# Patient Record
Sex: Female | Born: 1975 | Race: White | Hispanic: No | Marital: Single | Smoking: Never smoker
Health system: Southern US, Community
[De-identification: ages and names within clinical notes are randomized; demographics above are authoritative.]

## PROBLEM LIST (undated history)

## (undated) DIAGNOSIS — K900 Celiac disease: Secondary | ICD-10-CM

## (undated) DIAGNOSIS — M1990 Unspecified osteoarthritis, unspecified site: Secondary | ICD-10-CM

## (undated) DIAGNOSIS — I10 Essential (primary) hypertension: Secondary | ICD-10-CM

## (undated) DIAGNOSIS — Z87442 Personal history of urinary calculi: Secondary | ICD-10-CM

## (undated) DIAGNOSIS — K5900 Constipation, unspecified: Secondary | ICD-10-CM

## (undated) DIAGNOSIS — K829 Disease of gallbladder, unspecified: Secondary | ICD-10-CM

## (undated) DIAGNOSIS — K219 Gastro-esophageal reflux disease without esophagitis: Secondary | ICD-10-CM

## (undated) DIAGNOSIS — E119 Type 2 diabetes mellitus without complications: Secondary | ICD-10-CM

## (undated) DIAGNOSIS — G9332 Myalgic encephalomyelitis/chronic fatigue syndrome: Secondary | ICD-10-CM

## (undated) DIAGNOSIS — D649 Anemia, unspecified: Secondary | ICD-10-CM

## (undated) DIAGNOSIS — R0602 Shortness of breath: Secondary | ICD-10-CM

## (undated) DIAGNOSIS — N200 Calculus of kidney: Secondary | ICD-10-CM

## (undated) DIAGNOSIS — K869 Disease of pancreas, unspecified: Secondary | ICD-10-CM

## (undated) DIAGNOSIS — K859 Acute pancreatitis without necrosis or infection, unspecified: Secondary | ICD-10-CM

## (undated) DIAGNOSIS — R12 Heartburn: Secondary | ICD-10-CM

## (undated) DIAGNOSIS — M2550 Pain in unspecified joint: Secondary | ICD-10-CM

## (undated) DIAGNOSIS — E538 Deficiency of other specified B group vitamins: Secondary | ICD-10-CM

## (undated) DIAGNOSIS — F419 Anxiety disorder, unspecified: Secondary | ICD-10-CM

## (undated) DIAGNOSIS — E559 Vitamin D deficiency, unspecified: Secondary | ICD-10-CM

## (undated) DIAGNOSIS — M549 Dorsalgia, unspecified: Secondary | ICD-10-CM

## (undated) DIAGNOSIS — L409 Psoriasis, unspecified: Secondary | ICD-10-CM

## (undated) DIAGNOSIS — F32A Depression, unspecified: Secondary | ICD-10-CM

## (undated) HISTORY — DX: Disease of pancreas, unspecified: K86.9

## (undated) HISTORY — DX: Dorsalgia, unspecified: M54.9

## (undated) HISTORY — DX: Constipation, unspecified: K59.00

## (undated) HISTORY — PX: SPINE SURGERY: SHX786

## (undated) HISTORY — DX: Anemia, unspecified: D64.9

## (undated) HISTORY — PX: WISDOM TOOTH EXTRACTION: SHX21

## (undated) HISTORY — DX: Shortness of breath: R06.02

## (undated) HISTORY — DX: Deficiency of other specified B group vitamins: E53.8

## (undated) HISTORY — DX: Pain in unspecified joint: M25.50

## (undated) HISTORY — DX: Vitamin D deficiency, unspecified: E55.9

## (undated) HISTORY — DX: Heartburn: R12

## (undated) HISTORY — DX: Disease of gallbladder, unspecified: K82.9

## (undated) HISTORY — DX: Depression, unspecified: F32.A

## (undated) HISTORY — DX: Myalgic encephalomyelitis/chronic fatigue syndrome: G93.32

## (undated) HISTORY — DX: Anxiety disorder, unspecified: F41.9

## (undated) HISTORY — DX: Calculus of kidney: N20.0

## (undated) HISTORY — PX: CHOLECYSTECTOMY: SHX55

---

## 2003-09-27 ENCOUNTER — Ambulatory Visit (HOSPITAL_COMMUNITY): Admission: RE | Admit: 2003-09-27 | Discharge: 2003-09-27 | Payer: Self-pay | Admitting: Family Medicine

## 2003-10-02 ENCOUNTER — Ambulatory Visit (HOSPITAL_COMMUNITY): Admission: RE | Admit: 2003-10-02 | Discharge: 2003-10-02 | Payer: Self-pay | Admitting: Family Medicine

## 2003-10-11 ENCOUNTER — Ambulatory Visit (HOSPITAL_COMMUNITY): Admission: RE | Admit: 2003-10-11 | Discharge: 2003-10-11 | Payer: Self-pay | Admitting: General Surgery

## 2007-04-06 ENCOUNTER — Ambulatory Visit: Payer: Self-pay | Admitting: Internal Medicine

## 2007-04-18 ENCOUNTER — Ambulatory Visit (HOSPITAL_COMMUNITY): Admission: RE | Admit: 2007-04-18 | Discharge: 2007-04-18 | Payer: Self-pay | Admitting: Internal Medicine

## 2007-04-18 ENCOUNTER — Ambulatory Visit: Payer: Self-pay | Admitting: Internal Medicine

## 2007-06-01 ENCOUNTER — Ambulatory Visit: Payer: Self-pay | Admitting: Gastroenterology

## 2009-04-09 ENCOUNTER — Emergency Department (HOSPITAL_COMMUNITY): Admission: EM | Admit: 2009-04-09 | Discharge: 2009-04-09 | Payer: Self-pay | Admitting: Emergency Medicine

## 2010-01-09 ENCOUNTER — Encounter: Admission: RE | Admit: 2010-01-09 | Discharge: 2010-01-09 | Payer: Self-pay | Admitting: Orthopedic Surgery

## 2010-01-21 ENCOUNTER — Encounter (INDEPENDENT_AMBULATORY_CARE_PROVIDER_SITE_OTHER): Payer: Self-pay | Admitting: Orthopedic Surgery

## 2010-01-21 ENCOUNTER — Inpatient Hospital Stay (HOSPITAL_COMMUNITY): Admission: RE | Admit: 2010-01-21 | Discharge: 2010-01-22 | Payer: Self-pay | Admitting: Orthopedic Surgery

## 2010-07-02 LAB — TYPE AND SCREEN
ABO/RH(D): O POS
Antibody Screen: NEGATIVE

## 2010-07-02 LAB — CBC
HCT: 33.4 % — ABNORMAL LOW (ref 36.0–46.0)
HCT: 36.8 % (ref 36.0–46.0)
HCT: 40.3 % (ref 36.0–46.0)
Hemoglobin: 10.3 g/dL — ABNORMAL LOW (ref 12.0–15.0)
Hemoglobin: 11.6 g/dL — ABNORMAL LOW (ref 12.0–15.0)
Hemoglobin: 12.9 g/dL (ref 12.0–15.0)
MCH: 23.7 pg — ABNORMAL LOW (ref 26.0–34.0)
MCH: 23.9 pg — ABNORMAL LOW (ref 26.0–34.0)
MCH: 24.4 pg — ABNORMAL LOW (ref 26.0–34.0)
MCHC: 30.8 g/dL (ref 30.0–36.0)
MCHC: 31.5 g/dL (ref 30.0–36.0)
MCHC: 32 g/dL (ref 30.0–36.0)
MCV: 75.9 fL — ABNORMAL LOW (ref 78.0–100.0)
MCV: 76.3 fL — ABNORMAL LOW (ref 78.0–100.0)
MCV: 77 fL — ABNORMAL LOW (ref 78.0–100.0)
Platelets: 236 10*3/uL (ref 150–400)
Platelets: 259 10*3/uL (ref 150–400)
Platelets: 301 10*3/uL (ref 150–400)
RBC: 4.34 MIL/uL (ref 3.87–5.11)
RBC: 4.85 MIL/uL (ref 3.87–5.11)
RBC: 5.28 MIL/uL — ABNORMAL HIGH (ref 3.87–5.11)
RDW: 16.7 % — ABNORMAL HIGH (ref 11.5–15.5)
RDW: 16.9 % — ABNORMAL HIGH (ref 11.5–15.5)
RDW: 17.1 % — ABNORMAL HIGH (ref 11.5–15.5)
WBC: 8.7 10*3/uL (ref 4.0–10.5)
WBC: 8.9 10*3/uL (ref 4.0–10.5)
WBC: 8.9 10*3/uL (ref 4.0–10.5)

## 2010-07-02 LAB — URINALYSIS, ROUTINE W REFLEX MICROSCOPIC
Bilirubin Urine: NEGATIVE
Glucose, UA: NEGATIVE mg/dL
Ketones, ur: NEGATIVE mg/dL
Nitrite: NEGATIVE
Protein, ur: NEGATIVE mg/dL
Specific Gravity, Urine: 1.028 (ref 1.005–1.030)
Urobilinogen, UA: 0.2 mg/dL (ref 0.0–1.0)
pH: 5 (ref 5.0–8.0)

## 2010-07-02 LAB — ABO/RH: ABO/RH(D): O POS

## 2010-07-02 LAB — COMPREHENSIVE METABOLIC PANEL
ALT: 20 U/L (ref 0–35)
AST: 21 U/L (ref 0–37)
Albumin: 3.5 g/dL (ref 3.5–5.2)
Alkaline Phosphatase: 78 U/L (ref 39–117)
BUN: 12 mg/dL (ref 6–23)
CO2: 24 mEq/L (ref 19–32)
Calcium: 9.3 mg/dL (ref 8.4–10.5)
Chloride: 108 mEq/L (ref 96–112)
Creatinine, Ser: 0.83 mg/dL (ref 0.4–1.2)
GFR calc Af Amer: >60 mL/min (ref >60)
GFR calc non Af Amer: >60 mL/min (ref >60)
Glucose, Bld: 82 mg/dL (ref 70–99)
Potassium: 3.9 mEq/L (ref 3.5–5.1)
Sodium: 140 mEq/L (ref 135–145)
Total Bilirubin: 0.6 mg/dL (ref 0.3–1.2)
Total Protein: 7 g/dL (ref 6.0–8.3)

## 2010-07-02 LAB — DIFFERENTIAL
Basophils Absolute: 0 10*3/uL (ref 0.0–0.1)
Basophils Absolute: 0 10*3/uL (ref 0.0–0.1)
Basophils Relative: 0 % (ref 0–1)
Basophils Relative: 0 % (ref 0–1)
Eosinophils Absolute: 0.3 10*3/uL (ref 0.0–0.7)
Eosinophils Absolute: 0.4 10*3/uL (ref 0.0–0.7)
Eosinophils Relative: 4 % (ref 0–5)
Eosinophils Relative: 4 % (ref 0–5)
Lymphocytes Relative: 20 % (ref 12–46)
Lymphocytes Relative: 22 % (ref 12–46)
Lymphs Abs: 1.7 10*3/uL (ref 0.7–4.0)
Lymphs Abs: 1.9 10*3/uL (ref 0.7–4.0)
Monocytes Absolute: 0.6 10*3/uL (ref 0.1–1.0)
Monocytes Absolute: 0.7 10*3/uL (ref 0.1–1.0)
Monocytes Relative: 7 % (ref 3–12)
Monocytes Relative: 8 % (ref 3–12)
Neutro Abs: 6 10*3/uL (ref 1.7–7.7)
Neutro Abs: 6 10*3/uL (ref 1.7–7.7)
Neutrophils Relative %: 67 % (ref 43–77)
Neutrophils Relative %: 68 % (ref 43–77)

## 2010-07-02 LAB — PROTIME-INR
INR: 1.12 (ref 0.00–1.49)
Prothrombin Time: 14.6 seconds (ref 11.6–15.2)

## 2010-07-02 LAB — URINE MICROSCOPIC-ADD ON

## 2010-07-02 LAB — BASIC METABOLIC PANEL
BUN: 10 mg/dL (ref 6–23)
CO2: 25 mEq/L (ref 19–32)
Calcium: 8.4 mg/dL (ref 8.4–10.5)
Chloride: 108 mEq/L (ref 96–112)
Creatinine, Ser: 0.86 mg/dL (ref 0.4–1.2)
GFR calc Af Amer: >60 mL/min (ref >60)
GFR calc non Af Amer: >60 mL/min (ref >60)
Glucose, Bld: 120 mg/dL — ABNORMAL HIGH (ref 70–99)
Potassium: 3.6 mEq/L (ref 3.5–5.1)
Sodium: 139 mEq/L (ref 135–145)

## 2010-07-02 LAB — SURGICAL PCR SCREEN
MRSA, PCR: NEGATIVE
Staphylococcus aureus: POSITIVE — AB

## 2010-07-02 LAB — APTT: aPTT: 35 seconds (ref 24–37)

## 2010-07-20 LAB — RAPID STREP SCREEN (MED CTR MEBANE ONLY): Streptococcus, Group A Screen (Direct): NEGATIVE

## 2010-09-01 NOTE — Assessment & Plan Note (Signed)
NAMEARCHER, VISE                 CHART#:  [id number]   DATE:  06/01/2007                       DOB:  [birth year]   CHIEF COMPLAINT:  Follow up EGD.   SUBJECTIVE:  April Gonzales is a 35 year old female.  She was hospitalized  with idiopathic pancreatitis.  She has had some nausea and odynophagia,  which improved recently.  She was scheduled EGD on 04/18/07.  She was  found to have tiny distal esophageal erosions consistent with mild  erosive reflux esophagitis and a small hiatal hernia.  She has been  placed on Prilosec 20 mg daily.  She is doing quite well.  She denies  any problems with dysphagia or odynophagia.  She denies any abdominal  pain.  Denies any anorexia.  She denies any heartburn or indigestion.  She is having 2-3 soft, brown nonbloody bowel movements per day.  After  hospitalization, she was found to have anemia.  She did have a low iron  and percent saturation; however, a normal ferritin, UIBC, TIBC, and  folate.  She was to return three Hemoccult cards, but she did not do  this.  LFTs, amylase, and lipase were normal back in December.  Overall,  she feels very well.  Her weight has remained stable.  She tells me her  lipid status was okay when she was hospitalized at [facility].   CURRENT MEDICATIONS:  Omeprazole 20 mg daily.   ALLERGIES:  No known drug allergies.   OBJECTIVE:  VITAL SIGNS:  Weight 340 pounds.  Height 67 inches.  Temp  97, blood pressure 150/84, pulse 60.  GENERAL:  She is a morbid obesity Caucasian female who is alert,  oriented, pleasant, and cooperative in no acute distress.  HEENT:  Sclerae are clear and nonicteric.  Conjunctivae are pink.  Oropharynx pink and moist without any lesions.  CHEST:  Heart regular rate and rhythm with normal S1 and S2 without  murmurs, clicks, rubs, or gallops.  ABDOMEN:  Positive bowel sounds x4.  No bruits auscultated.  Soft,  nontender, nondistended without palpable mass or hepatosplenomegaly.   No  rebound tenderness or guarding.  Exam is limited secondary to patient  body habitus.  EXTREMITIES:  Without clubbing or edema bilaterally.   ASSESSMENT:  April Gonzales is a 35 year old female with erosive reflux  esophagitis.  Has responded well to PPI.  She also has a history of  idiopathic pancreatitis in November, 2008, hospitalized at [facility], brought on after Z-pack.  She has a mild anemia.   PLAN:  1. Would recheck H&H.  2. Suggested significant weight loss.  3. Continue omeprazole 20 mg daily.  4. Will discuss further with Dr. Jena Gauss whether she is going to need      EUS.   Addendum:  Discussed with Dr Jena Gauss, will f/u 6 months.  No need for EUS  now.       April Gonzales, N.P.  Electronically Signed     April Gonzales, M.D.  Electronically Signed    KJ/MEDQ  D:  06/02/2007  T:  06/05/2007  Job:  [id number]   cc:   April Gonzales, M.D.

## 2010-09-01 NOTE — Op Note (Signed)
April Gonzales, April Gonzales                ACCOUNT NO.:  [id number]   MEDICAL RECORD NO.:  [id number]          PATIENT TYPE:  AMB   LOCATION:  DAY                           FACILITY:  [facility]   PHYSICIAN:  R. Roetta Sessions, M.D. DATE OF BIRTH:  January 05, 1976   DATE OF PROCEDURE:  04/18/2007  DATE OF DISCHARGE:                               OPERATIVE REPORT   DIAGNOSTIC ESOPHAGOGASTRODUODENOSCOPY:   INDICATIONS FOR PROCEDURE:  This is a 35 year old morbidly obese  Caucasian female recently hospitalized with pancreatitis.  She has  recovered from that illness.  She has had some nausea and odynophagia,  which has improved recently.  She is not on any acid suppression  therapy.  EGD is now being done to further evaluate those symptoms.  This approach has discussed with the patient at length.  Potential  risks, benefits and alternatives have been reviewed, questions answered.  She is agreeable.  Please see the documentation in the medical record.   PROCEDURE NOTE:  O2 saturation, blood pressure, pulse and respirations  were monitored throughout the entire procedure.  Conscious sedation:  Versed 5 mg IV, Demerol 100 mg IV in divided doses, Cetacaine spray for  topical pharyngeal anesthesia.  Instrument:  Pentax video chip system.   FINDINGS:  Examination of the tubular esophagus revealed a couple of  tiny distal esophageal erosions.  Otherwise the esophageal mucosa  appeared entirely normal.  It was widely patent and easily traversed.   Stomach:  The gastric cavity was empty and insufflated well with air.  A  thorough examination of the gastric mucosa including retroflexed view of  the proximal stomach and esophagogastric junction demonstrated only a  small hiatal hernia.  The pylorus was patent and easily traversed.  Examination of the bulb and second portion revealed no abnormalities.   Therapeutic/diagnostic maneuvers performed:  None.   The patient tolerated the procedure well and was reacted  in endoscopy.   IMPRESSION:  1,  Tiny distal esophageal erosions consistent with mild  erosive reflux esophagitis, otherwise normal esophagus.  1. Small hiatal hernia, otherwise normal stomach, D1, D2.   RECOMMENDATIONS:  1. Gastroesophageal reflux literature provided to Ms. Raborn.  Begin      Prilosec 20 mg orally daily.  2. Will arrange a follow-up appointment with [place] in 6 weeks to see how      she is doing.      Jonathon Bellows, M.D.  Electronically Signed     RMR/MEDQ  D:  04/18/2007  T:  04/18/2007  Job:  [id number]   cc:   Patrica Duel, M.D.  Fax: [phone]

## 2010-09-01 NOTE — Consult Note (Signed)
NAMELICET, DUNPHY                ACCOUNT NO.:  [id number]   MEDICAL RECORD NO.:  [id number]          PATIENT TYPE:  AMB   LOCATION:  DAY                           FACILITY:  [facility]   PHYSICIAN:  R. Roetta Sessions, M.D. DATE OF BIRTH:  12/02/1974   DATE OF CONSULTATION:  04/06/2007  DATE OF DISCHARGE:                                 CONSULTATION   REASON FOR CONSULTATION:  Acute pancreatitis.   HISTORY OF PRESENT ILLNESS:  April Gonzales is a 35 year old female who was  admitted to [facility] on March 18, 2007.  She was  discharged March 23, 2007.  She was found to have acute pancreatitis.  She tells me that she had bronchitis.  She was treated with a Z-Pak.  She had had a large meal including salisbury steak and macaroni and  cheese and took her dose of medications.  She began to have burning  along her esophagus and in her epigastrium.  This persisted.  She  presented to the emergency room because of severe abdominal pain. While  there, she was found to have an elevated amylase of 492 and lipase of  941.  White count was 12,200.  She had a CT scan, which I do not have a  copy of, but is said to have moderate inflammatory changes along the  distal body until the pancreas without pseudocyst.  She is status post  cholecystectomy approximately 2 years ago secondary to a biliary  dyskinesia.  She had another episode of abdominal pain 1 week ago after  drinking a coke.  She denies any alcohol use.  She denies any history of  hypercholesterolemia and denies any anorexia, heartburn or indigestion.  Her weight has remained stable.  Although she did lose several pounds  when she was hospitalized, she has gained this back.  She denies  abdominal pain currently.  She does have some transient nausea.  Has not  vomited since she was in the hospital.  She did have fever with acute  episode of pain.  She did notice darkening of her urine, but denies any  clay-colored stools.  Denies  any jaundice.   PAST MEDICAL AND SURGICAL HISTORY:  1. She is status post cholecystectomy a couple of years ago due to      biliary dyskinesia.  2. She has severe psoriasis diagnosed at age 15.  3. She has had some wisdom teeth extracted.  4. She has history of recent bronchitis.   CURRENT MEDICATIONS:  Percocet p.r.n.   ALLERGIES:  No known drug allergies.   FAMILY HISTORY:  There is no known family history of carcinoma, liver or  chronic GI problems.  Mother has history of hypertension and diabetes  mellitus.  Father has history of hypertension.  She has one healthy  sister.   SOCIAL HISTORY:  April Gonzales lives with her parents.  She is single.  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  She denies any  tobacco, alcohol or drug use.   REVIEW OF SYSTEMS:  See HPI;  otherwise negative.   PHYSICAL EXAMINATION:  VITAL SIGNS:  Weight 338 pounds, height 68  inches, temperature 98.2, blood pressure 112/78, pulse 64, BMI 51.4.  GENERAL:  April Gonzales is a morbidly obese, Caucasian female who is alert  and pleasant, cooperative, in no acute distress.  HEENT.  Conjunctivae  clear.  Sclerae nonicteric. Oropharynx pink and moist without lesions.  NECK:  Supple without thyromegaly.  HEART:  Regular rate and rhythm.  Normal S1 and S2.  No murmurs, clicks,  rubs or gallops.  LUNGS:  Clear to auscultation bilaterally.  ABDOMEN:  Protuberant with positive bowel sounds x4.  No bruits  auscultated.  She does have erythema and demarcated scaling to her left  abdomen consistent with history of psoriasis.  Abdomen is otherwise  soft, nontender without palpable mass or hepatosplenomegaly.  No rebound  tenderness or guarding.  Exam is limited given the patient's body  habitus.  EXTREMITIES:  With trace lower extremity edema bilaterally.  She has  demarcated erythematous, scaling areas of both upper and lower  extremities as well as on her trunk and abdomen as described above.    IMPRESSION:  April Gonzales is a 35 year old, morbidly obese female who  developed acute pancreatitis approximately 3 weeks ago.  She had another  episode of pain 1 week ago.  She is status post cholecystectomy and  there is no common bile duct dilatation on CT report, though will need  to review films to be sure there is no evidence of gallstones or  actually choledocholithiasis.  There is no history of alcohol use.  Interesting was this all started around the time she was taking the Z-  Pak.  There is very little in the literature regarding azithromycin-  induced pancreatitis.  She is having some transient nausea and  odynophagia.  Would be concerned about peptic ulcer disease as the  culprit for her symptoms which is going to require further evaluation.  Also she was noted to have microcytic anemia while hospitalized which  may need further evaluation as well.  She has significant psoriasis.  Would be concerned of an autoimmune component and sphincter of Oddi  dysfunction should remain in the differential as well.   PLAN:  1. Check CBC, LFTs, amylase and lipase.  2. An EGD with Dr. Jena Gauss in the future.  I have discussed the      procedure including risks and benefits, including not limited to      infection, perforation, drug reaction.  She is agrees to the plan      and consent was obtained.  3. She has Percocet at home for pain p.r.n.  4. She is to go immediately to emergency room on and call us if she      has any further problems in the interim.  5. She is to stay on a bland low fat, low residue diet.  6. If CBC shows anemia, would proceed with iron studies and she may      possibly need a colonoscopy down the      road as well.  If EGD is benign, would consider further evaluation      with EUS versus MRCP to further evaluate the pancreas.   I would like to thank Dr. Nobie Putnam for allowing [place] to participate in the  care of April Gonzales.      Lorenza Burton, N.P.      Jonathon Bellows, M.D.  Electronically Signed    KJ/MEDQ  D:  04/06/2007  T:  04/07/2007  Job:  [id number]   cc:   Patrica Duel, M.D.  Fax: [phone]

## 2010-09-04 NOTE — Op Note (Signed)
NAME:  April Gonzales, April Gonzales                          ACCOUNT NO.:  [id number]   MEDICAL RECORD NO.:  [id number]                   PATIENT TYPE:  AMB   LOCATION:  DAY                                  FACILITY:  [facility]   PHYSICIAN:  Dalia Heading, M.D.               DATE OF BIRTH:  1976/01/01   DATE OF PROCEDURE:  10/11/2003  DATE OF DISCHARGE:                                 OPERATIVE REPORT   PREOPERATIVE DIAGNOSIS:  Chronic cholecystitis.   POSTOPERATIVE DIAGNOSIS:  Chronic cholecystitis.   PROCEDURE:  Laparoscopic cholecystectomy.   SURGEON:  Dalia Heading, M.D.   ASSISTANT:  Bernerd Limbo. Leona Carry, M.D.   ANESTHESIA:  General endotracheal.   INDICATIONS FOR PROCEDURE:  The patient is a 35 year old white female who  was referred for evaluation and treatment of biliary colic secondary to  chronic cholecystitis.  The risks and benefits of the procedure, including  bleeding, infection, hepatobiliary injury, and the possibility of an open  procedure were fully explained to the patient who gave informed consent.   DESCRIPTION OF PROCEDURE:  The patient was placed in the supine position.  After induction of general endotracheal anesthesia, the abdomen was prepped  and draped using the usual sterile technique with Betadine.  Surgical site  confirmation was performed.   A supraumbilical incision was made down to the fascia.  A Veress needle was  introduced into the abdominal cavity, and confirmation of placement was done  using the saline drop test.  The abdomen was then insufflated to 16 mmHg  pressure.  An 11-mm trocar was introduced into the abdominal cavity under  direct visualization without difficulty.  The patient was placed in reverse  Trendelenburg position, and an additional 11-mm trocar was placed in the  epigastric region and 5-mm trocars were placed in the right upper quadrant  and right flank regions.  The liver was inspected and noted to be within  normal limits.  The  gallbladder was retracted superiorly and laterally.  The  dissection was begun around the infundibulum of the gallbladder.  The cystic  duct was first identified.  Its juncture to the infundibulum was fully  identified.  Endoclips were placed proximally and distally on the cystic  duct, and the cystic duct was divided.  This was likewise done on the cystic  artery.  The gallbladder was then freed away from the gallbladder fossa  using Bovie electrocautery.  The gallbladder was delivered through the  epigastric trocar site using an EndoCatch bag.  The gallbladder fossa was  inspected, and no abnormal bleeding or bile leakage was noted.  Surgicel was  placed in the gallbladder fossa.  All fluid and air were then evacuated from  the abdominal cavity prior to removal of the trocars.   All wounds were irrigated with normal saline.  All wounds were injected with  0.5% Sensorcaine.  The fascia was inspected and noted to be  intact.  All  skin incisions were closed using staples.  Betadine ointment and dry sterile  dressings were applied.   All tape and needle counts were correct at the end of the procedure.  The  patient was extubated in the operating room and went back to the recovery  room awake and in stable condition.   COMPLICATIONS:  None.   SPECIMENS:  Gallbladder.   ESTIMATED BLOOD LOSS:  Minimal.      ___________________________________________                                            Dalia Heading, M.D.   MAJ/MEDQ  D:  10/11/2003  T:  10/11/2003  Job:  [id number]   cc:   Patrica Duel, M.D.  [address], [address]  [place]  [place] [address]  Fax: [phone]

## 2010-09-04 NOTE — H&P (Signed)
NAME:  April Gonzales, April Gonzales                          ACCOUNT NO.:  [id number]   MEDICAL RECORD NO.:  [id number]                  PATIENT TYPE:   LOCATION:                                       FACILITY:  [facility]   PHYSICIAN:  Dalia Heading, M.D.               DATE OF BIRTH:  [birth year]   DATE OF ADMISSION:  DATE OF DISCHARGE:                                HISTORY & PHYSICAL   CHIEF COMPLAINT:  Chronic cholecystitis.   HISTORY OF PRESENT ILLNESS:  Patient is a 35 year old white female who is  referred for evaluation and treatment of biliary colic secondary to chronic  cholecystitis.  She has been having intermittent episodes of right upper  quadrant abdominal pain with radiation to the right flank, nausea, bloating  for many weeks.  Patient does have a fatty-food intolerance.  No fever,  chills, or jaundice have been noted.  Her symptoms have been worsening.   PAST MEDICAL HISTORY:  Includes irritable bowel.   PAST SURGICAL HISTORY:  Wisdom teeth removal.   CURRENT MEDICATIONS:  Raptiva once a week.  Imodium p.r.n.  Flagyl as  needed.   ALLERGIES:  No known drug allergies.   REVIEW OF SYSTEMS:  Noncontributory.   PHYSICAL EXAMINATION:  VITAL SIGNS:  She is afebrile.  Vital signs are  stable.  GENERAL:  Patient is an obese white female in no acute distress.  HEENT:  No scleral icterus.  LUNGS:  Clear to auscultation with equal breath sounds bilaterally.  HEART:  Regular rate and rhythm without S3, S4, or murmurs.  ABDOMEN:  Soft with slight tenderness in the right upper quadrant to  palpation.  No hepatosplenomegaly, masses, or hernias are identified.   Ultrasound of the gallbladder is negative.  Hepatobiliary scan reveals  chronic cholecystitis with low gallbladder ejection fraction and  reproducible symptoms with a fatty meal.   IMPRESSION:  Chronic cholecystitis.   PLAN:  Patient is scheduled for a laparoscopic cholecystectomy on October 11, 2003.  The risks and benefits of  the procedure, including bleeding,  infection, hepatobiliary injury, and the possibility of an open procedure,  were fully explained to the patient, who gave informed consent.     ___________________________________________                                         Dalia Heading, M.D.   MAJ/MEDQ  D:  10/08/2003  T:  10/08/2003  Job:  [id number]   cc:   Patrica Duel, M.D.  [address]  [place]  [place] [address]  Fax: [phone]

## 2011-01-22 LAB — HCG, QUANTITATIVE, PREGNANCY: hCG, Beta Chain, Quant, S: <2

## 2012-11-20 ENCOUNTER — Telehealth: Payer: Self-pay | Admitting: Certified Nurse Midwife

## 2012-11-20 NOTE — Telephone Encounter (Signed)
Phone call to patient regarding need to discuss follow up

## 2012-11-20 NOTE — Telephone Encounter (Signed)
Call to patient to call back regarding her decision for follow up, Left message on answering machine

## 2012-11-30 ENCOUNTER — Encounter: Payer: Self-pay | Admitting: Certified Nurse Midwife

## 2012-12-07 ENCOUNTER — Encounter: Payer: Self-pay | Admitting: Certified Nurse Midwife

## 2015-03-11 ENCOUNTER — Other Ambulatory Visit: Payer: Self-pay | Admitting: Nurse Practitioner

## 2015-03-11 DIAGNOSIS — R928 Other abnormal and inconclusive findings on diagnostic imaging of breast: Secondary | ICD-10-CM

## 2015-03-19 ENCOUNTER — Ambulatory Visit
Admission: RE | Admit: 2015-03-19 | Discharge: 2015-03-19 | Disposition: A | Discharge status: Home / Self Care | Payer: 59 | Source: Ambulatory Visit | Attending: Nurse Practitioner | Admitting: Nurse Practitioner

## 2015-03-19 ENCOUNTER — Other Ambulatory Visit: Payer: Self-pay | Admitting: Nurse Practitioner

## 2015-03-19 DIAGNOSIS — Z1231 Encounter for screening mammogram for malignant neoplasm of breast: Secondary | ICD-10-CM

## 2015-03-19 DIAGNOSIS — R928 Other abnormal and inconclusive findings on diagnostic imaging of breast: Secondary | ICD-10-CM

## 2015-03-21 ENCOUNTER — Other Ambulatory Visit: Payer: Self-pay | Admitting: Nurse Practitioner

## 2015-03-21 DIAGNOSIS — R928 Other abnormal and inconclusive findings on diagnostic imaging of breast: Secondary | ICD-10-CM

## 2015-04-01 ENCOUNTER — Ambulatory Visit
Admission: RE | Admit: 2015-04-01 | Discharge: 2015-04-01 | Disposition: A | Discharge status: Home / Self Care | Payer: 59 | Source: Ambulatory Visit | Attending: Nurse Practitioner | Admitting: Nurse Practitioner

## 2015-04-01 DIAGNOSIS — R928 Other abnormal and inconclusive findings on diagnostic imaging of breast: Secondary | ICD-10-CM

## 2016-02-20 ENCOUNTER — Observation Stay (HOSPITAL_COMMUNITY)
Admission: EM | Admit: 2016-02-20 | Discharge: 2016-02-22 | Disposition: A | Discharge status: Home / Self Care | Payer: 59 | Attending: Internal Medicine | Admitting: Internal Medicine

## 2016-02-20 ENCOUNTER — Encounter (HOSPITAL_COMMUNITY): Payer: Self-pay | Admitting: *Deleted

## 2016-02-20 ENCOUNTER — Emergency Department (HOSPITAL_COMMUNITY): Payer: 59

## 2016-02-20 DIAGNOSIS — Z8349 Family history of other endocrine, nutritional and metabolic diseases: Secondary | ICD-10-CM

## 2016-02-20 DIAGNOSIS — K859 Acute pancreatitis without necrosis or infection, unspecified: Secondary | ICD-10-CM | POA: Diagnosis not present

## 2016-02-20 DIAGNOSIS — K5900 Constipation, unspecified: Secondary | ICD-10-CM | POA: Insufficient documentation

## 2016-02-20 DIAGNOSIS — Z8249 Family history of ischemic heart disease and other diseases of the circulatory system: Secondary | ICD-10-CM | POA: Diagnosis not present

## 2016-02-20 DIAGNOSIS — L409 Psoriasis, unspecified: Secondary | ICD-10-CM | POA: Insufficient documentation

## 2016-02-20 DIAGNOSIS — Z833 Family history of diabetes mellitus: Secondary | ICD-10-CM

## 2016-02-20 DIAGNOSIS — Z9049 Acquired absence of other specified parts of digestive tract: Secondary | ICD-10-CM | POA: Diagnosis not present

## 2016-02-20 DIAGNOSIS — R030 Elevated blood-pressure reading, without diagnosis of hypertension: Secondary | ICD-10-CM | POA: Diagnosis not present

## 2016-02-20 DIAGNOSIS — K900 Celiac disease: Secondary | ICD-10-CM | POA: Diagnosis present

## 2016-02-20 DIAGNOSIS — Z79899 Other long term (current) drug therapy: Secondary | ICD-10-CM

## 2016-02-20 DIAGNOSIS — Z841 Family history of disorders of kidney and ureter: Secondary | ICD-10-CM

## 2016-02-20 DIAGNOSIS — K219 Gastro-esophageal reflux disease without esophagitis: Secondary | ICD-10-CM | POA: Insufficient documentation

## 2016-02-20 DIAGNOSIS — Z885 Allergy status to narcotic agent status: Secondary | ICD-10-CM

## 2016-02-20 DIAGNOSIS — Z6841 Body Mass Index (BMI) 40.0 and over, adult: Secondary | ICD-10-CM | POA: Diagnosis not present

## 2016-02-20 HISTORY — DX: Celiac disease: K90.0

## 2016-02-20 HISTORY — DX: Psoriasis, unspecified: L40.9

## 2016-02-20 LAB — CBC
HEMATOCRIT: 42.1 % (ref 36.0–46.0)
HEMOGLOBIN: 13.4 g/dL (ref 12.0–15.0)
MCH: 25.5 pg — ABNORMAL LOW (ref 26.0–34.0)
MCHC: 31.8 g/dL (ref 30.0–36.0)
MCV: 80 fL (ref 78.0–100.0)
Platelets: 333 10*3/uL (ref 150–400)
RBC: 5.26 MIL/uL — ABNORMAL HIGH (ref 3.87–5.11)
RDW: 16.5 % — ABNORMAL HIGH (ref 11.5–15.5)
WBC: 12.8 10*3/uL — ABNORMAL HIGH (ref 4.0–10.5)

## 2016-02-20 LAB — COMPREHENSIVE METABOLIC PANEL
ALBUMIN: 3.5 g/dL (ref 3.5–5.0)
ALT: 21 U/L (ref 14–54)
ANION GAP: 9 (ref 5–15)
AST: 20 U/L (ref 15–41)
Alkaline Phosphatase: 81 U/L (ref 38–126)
BILIRUBIN TOTAL: 0.6 mg/dL (ref 0.3–1.2)
BUN: 12 mg/dL (ref 6–20)
CHLORIDE: 102 mmol/L (ref 101–111)
CO2: 27 mmol/L (ref 22–32)
Calcium: 9 mg/dL (ref 8.9–10.3)
Creatinine, Ser: 0.96 mg/dL (ref 0.44–1.00)
GFR calc Af Amer: >60 mL/min (ref >60)
GFR calc non Af Amer: >60 mL/min (ref >60)
GLUCOSE: 101 mg/dL — AB (ref 65–99)
POTASSIUM: 4.3 mmol/L (ref 3.5–5.1)
SODIUM: 138 mmol/L (ref 135–145)
TOTAL PROTEIN: 7.8 g/dL (ref 6.5–8.1)

## 2016-02-20 LAB — URINALYSIS, ROUTINE W REFLEX MICROSCOPIC
Bilirubin Urine: NEGATIVE
Glucose, UA: NEGATIVE mg/dL
Hgb urine dipstick: NEGATIVE
Ketones, ur: NEGATIVE mg/dL
NITRITE: NEGATIVE
PH: 5 (ref 5.0–8.0)
Protein, ur: NEGATIVE mg/dL
SPECIFIC GRAVITY, URINE: 1.022 (ref 1.005–1.030)

## 2016-02-20 LAB — URINE MICROSCOPIC-ADD ON: RBC / HPF: NONE SEEN RBC/hpf (ref 0–5)

## 2016-02-20 LAB — LIPASE, BLOOD: LIPASE: 33 U/L (ref 11–51)

## 2016-02-20 LAB — POC URINE PREG, ED: Preg Test, Ur: NEGATIVE

## 2016-02-20 MED ORDER — HEPARIN SODIUM (PORCINE) 5000 UNIT/ML IJ SOLN
5000.0000 [IU] | Freq: Three times a day (TID) | INTRAMUSCULAR | Status: DC
  Administered 2016-02-20 – 2016-02-22 (×5): 5000 [IU] via SUBCUTANEOUS
  Filled 2016-02-20 (×5): qty 1

## 2016-02-20 MED ORDER — MORPHINE SULFATE (PF) 4 MG/ML IV SOLN
4.0000 mg | INTRAVENOUS | Status: DC | PRN
  Administered 2016-02-20 – 2016-02-21 (×3): 4 mg via INTRAVENOUS
  Filled 2016-02-20 (×4): qty 1

## 2016-02-20 MED ORDER — DOCUSATE SODIUM 100 MG PO CAPS
100.0000 mg | ORAL_CAPSULE | Freq: Two times a day (BID) | ORAL | Status: DC
  Administered 2016-02-20 – 2016-02-21 (×2): 100 mg via ORAL
  Filled 2016-02-20 (×4): qty 1

## 2016-02-20 MED ORDER — SODIUM CHLORIDE 0.9 % IV SOLN
INTRAVENOUS | Status: AC
  Administered 2016-02-20 – 2016-02-21 (×2): via INTRAVENOUS

## 2016-02-20 MED ORDER — MORPHINE SULFATE (PF) 2 MG/ML IV SOLN: 1.0000 mg | INTRAVENOUS | Status: DC | PRN

## 2016-02-20 MED ORDER — IOPAMIDOL (ISOVUE-300) INJECTION 61%
INTRAVENOUS | Status: AC
  Administered 2016-02-20: 100 mL
  Filled 2016-02-20: qty 100

## 2016-02-20 MED ORDER — ONDANSETRON HCL 4 MG/2ML IJ SOLN
4.0000 mg | Freq: Three times a day (TID) | INTRAMUSCULAR | Status: AC | PRN
  Administered 2016-02-21: 4 mg via INTRAVENOUS
  Filled 2016-02-20: qty 2

## 2016-02-20 MED ORDER — SENNOSIDES-DOCUSATE SODIUM 8.6-50 MG PO TABS: 1.0000 | ORAL_TABLET | Freq: Every evening | ORAL | Status: DC | PRN

## 2016-02-20 MED ORDER — SODIUM CHLORIDE 0.9 % IV BOLUS (SEPSIS)
1000.0000 mL | Freq: Once | INTRAVENOUS | Status: AC
  Administered 2016-02-20: 1000 mL via INTRAVENOUS

## 2016-02-20 MED ORDER — ONDANSETRON HCL 4 MG/2ML IJ SOLN
4.0000 mg | Freq: Once | INTRAMUSCULAR | Status: AC
Start: 2016-02-20 — End: 2016-02-20
  Administered 2016-02-20: 4 mg via INTRAVENOUS
  Filled 2016-02-20: qty 2

## 2016-02-20 MED ORDER — MORPHINE SULFATE (PF) 4 MG/ML IV SOLN
4.0000 mg | Freq: Once | INTRAVENOUS | Status: AC
  Administered 2016-02-20: 4 mg via INTRAVENOUS
  Filled 2016-02-20: qty 1

## 2016-02-20 NOTE — ED Provider Notes (Signed)
[facility]-EMERGENCY DEPT Provider Note   CSN: [id number] Arrival date & time: 02/20/16  1525     History   Chief Complaint Chief Complaint  Patient presents with  . Abdominal Pain    HPI April RicksJulie A Gonzales is a 40 y.o. female.  HPI   Patient with hx pancreatitis presents with upper abdominal pain with nausea and constipation x 3 days.  Pain is crampy and achy.   Pain has been gradually worsening, exacerbated with eating and lying flat.  Feels like prior pancreatitis.  At baseline has 2-3 bowel movements daily, has had very small hard to pass bowel movements for the past few days, last was this morning.  Unable to eat and drink due to pain. Denies fevers, vomiting, urinary symptoms, CP, cough, SOB.  Has past abdominal surgery of cholecystectomy only.     Pt is on Stelara - immunosuppressive therapy - for her psoriasis.    History reviewed. No pertinent past medical history.  Patient Active Problem List   Diagnosis Date Noted  . Acute pancreatitis 02/20/2016    History reviewed. No pertinent surgical history.  OB History    No data available       Home Medications    Prior to Admission medications   Not on File    Family History No family history on file.  Social History Social History  Substance Use Topics  . Smoking status: Never Smoker  . Smokeless tobacco: Never Used  . Alcohol use No     Allergies   Dilaudid [hydromorphone hcl]   Review of Systems Review of Systems  All other systems reviewed and are negative.    Physical Exam Updated Vital Signs BP 161/84   Pulse 74   Temp 98.7 F (37.1 C) (Oral)   Resp 16   Ht 5\' 6"  (1.676 m)   Wt (!) 169.3 kg   LMP 02/06/2016   SpO2 99%   BMI 60.23 kg/m   Physical Exam  Constitutional: She appears well-developed and well-nourished. No distress.  Morbidly obese  HENT:  Head: Normocephalic and atraumatic.  Neck: Neck supple.  Cardiovascular: Normal rate and regular rhythm.   Pulmonary/Chest: Effort  normal and breath sounds normal. No respiratory distress. She has no wheezes. She has no rales.  Abdominal: Soft. She exhibits no distension. There is tenderness (epigastric, RUQ). There is no rebound and no guarding.  Neurological: She is alert.  Skin: She is not diaphoretic.  Nursing note and vitals reviewed.    ED Treatments / Results  Labs (all labs ordered are listed, but only abnormal results are displayed) Labs Reviewed  COMPREHENSIVE METABOLIC PANEL - Abnormal; Notable for the following:       Result Value   Glucose, Bld 101 (*)    All other components within normal limits  CBC - Abnormal; Notable for the following:    WBC 12.8 (*)    RBC 5.26 (*)    MCH 25.5 (*)    RDW 16.5 (*)    All other components within normal limits  URINALYSIS, ROUTINE W REFLEX MICROSCOPIC (NOT [facility]) - Abnormal; Notable for the following:    APPearance HAZY (*)    Leukocytes, UA SMALL (*)    All other components within normal limits  URINE MICROSCOPIC-ADD ON - Abnormal; Notable for the following:    Squamous Epithelial / LPF 0-5 (*)    Bacteria, UA FEW (*)    All other components within normal limits  LIPASE, BLOOD  POC URINE PREG, ED  EKG  EKG Interpretation None       Radiology Ct Abdomen Pelvis W Contrast  Result Date: 02/20/2016 CLINICAL DATA:  Epigastric pain radiating to the right flank for several days, initial encounter EXAM: CT ABDOMEN AND PELVIS WITH CONTRAST TECHNIQUE: Multidetector CT imaging of the abdomen and pelvis was performed using the standard protocol following bolus administration of intravenous contrast. CONTRAST:  100 mL ISOVUE-300 IOPAMIDOL (ISOVUE-300) INJECTION 61% COMPARISON:  None. FINDINGS: Lower chest: No acute abnormality. Hepatobiliary: No focal liver abnormality is seen. Status post cholecystectomy. No biliary dilatation. Pancreas: Some heterogeneity is noted in the region of the head of the pancreas with some peripancreatic inflammatory changes.  Scattered peripancreatic lymph nodes are noted. 11 mm node in the portacaval space is noted. Spleen: Normal in size without focal abnormality. Adrenals/Urinary Tract: Adrenal glands are unremarkable. Kidneys are normal, without renal calculi, focal lesion, or hydronephrosis. Bladder is unremarkable. Stomach/Bowel: Stomach is within normal limits. Appendix appears normal. No evidence of bowel wall thickening, distention, or inflammatory changes. Vascular/Lymphatic: No significant vascular findings are present. No enlarged abdominal or pelvic lymph nodes. Reproductive: Uterus and bilateral adnexa are unremarkable. Other: No abdominal wall hernia or abnormality. No abdominopelvic ascites. Musculoskeletal: No acute or significant osseous findings. IMPRESSION: Mild peripancreatic inflammatory change with heterogeneity in the region of the pancreatic head. Changes are consistent with focal acute pancreatitis with adjacent lymphadenopathy. Electronically Signed   By: Alcide CleverMark  Lukens M.D.   On: 02/20/2016 19:19    Procedures Procedures (including critical care time)  Medications Ordered in ED Medications  morphine 4 MG/ML injection 4 mg (4 mg Intravenous Given 02/20/16 1806)  sodium chloride 0.9 % bolus 1,000 mL (1,000 mLs Intravenous New Bag/Given 02/20/16 1806)  ondansetron (ZOFRAN) injection 4 mg (4 mg Intravenous Given 02/20/16 1806)  iopamidol (ISOVUE-300) 61 % injection (  Canceled Entry 02/20/16 1916)     Initial Impression / Assessment and Plan / ED Course  I have reviewed the triage vital signs and the nursing notes.  Pertinent labs & imaging results that were available during my care of the patient were reviewed by me and considered in my medical decision making (see chart for details).  Clinical Course    Afebrile patient with upper abdominal pain, constipation, nausea.  Pt is morbidly obese, exam somewhat limited secondary to body habitus.  Labs unremarkable.  UA does not appear infected.  Upreg  negative.  Pt is on therapy for her psoriasis that makes her immunocompromised. CT abd/pelvis demonstrates acute pancreatitis.  [facility] to admit, Dr Debe CoderEmily Mullen accepting   Final Clinical Impressions(s) / ED Diagnoses   Final diagnoses:  Acute pancreatitis without infection or necrosis, unspecified pancreatitis type    New Prescriptions New Prescriptions   No medications on file     Trixie Dredgemily Tashianna Broome, PA-C 02/20/16 2006    Benjiman CoreNathan Pickering, MD 02/20/16 2317

## 2016-02-20 NOTE — ED Triage Notes (Signed)
The pt is c/o epigastric pain with rt side radiation for 2 days she saw her doctor today and he sent her here she has had pancreatitis in the past  Unknown cause

## 2016-02-20 NOTE — H&P (Signed)
History and Physical    April RicksJulie A Gonzales [id number] DOB: [birth year] DOA: 02/20/2016  PCP: Filomena JunglingEDWARDS, JERRY, NP  Patient coming from: Home  Chief Complaint: epigastric pain radiating to back  HPI: April RicksJulie A April Gonzales is a 40 y.o. female with medical history significant of borderline DM (no meds), borderline HTN (no meds), celiac disease, psoriasis who presents for epigastric pain.  She notes that the pain started Wednesday and was moderate. She thought it was gas pain and took some gas medication for it and some ibuprofen which did not help.  The pain worsened to the point that she could not lie down and so she came to be seen.  The pain was 10/10 at it's worst.  She reports nothing made the pain any better until the morphine she got in the ED.  She had some nausea, but no emesis. She ate yesterday, but when she tried to eat soup today her pain got much worse. She has had subjective fevers, but no chills.  Further symptoms includes constipation.  She has had no blood loss, change in bowel habits, chest pain, SOB, urinary symptoms.  She has had her gallbladder out, but she cannot remember why as it was a long time ago.  She does not drink ETOH.  She does not know if she has been checked for cholesterol issues, but her father has high cholesterol.     ED Course: In the Ed, she was given fluids and pain medications which helped her pain.  She had a normal lipase, so CT abdomen was done which showed focal acute pancreatitis.    Review of Systems: As per HPI otherwise 10 point review of systems negative.    Past Medical History:  Diagnosis Date  . Celiac disease   . Psoriasis     Past Surgical History:  Procedure Laterality Date  . CHOLECYSTECTOMY       reports that she has never smoked. She has never used smokeless tobacco. She reports that she does not drink alcohol or use drugs.  Allergies  Allergen Reactions  . Dilaudid [Hydromorphone Hcl]     Family History  Problem Relation Age of Onset   . Diabetes Mellitus II Mother   . Hypertension Mother   . CAD Father   . Chronic Renal Failure Father   . Hyperlipidemia Father    Medications Stelara for her psoriasis   Physical Exam: Vitals:   02/20/16 1930 02/20/16 1945 02/20/16 2032 02/20/16 2047  BP: 156/84 161/84  (!) 158/62  Pulse: 73 74  81  Resp:    17  Temp:   98.3 F (36.8 C) 99 F (37.2 C)  TempSrc:   Oral Oral  SpO2: 98% 99%  99%  Weight:    (!) 376 lb 8 oz (170.8 kg)  Height:    5' 6.5" (1.689 m)      Constitutional: NAD, calm, comfortable, obese Vitals:   02/20/16 1930 02/20/16 1945 02/20/16 2032 02/20/16 2047  BP: 156/84 161/84  (!) 158/62  Pulse: 73 74  81  Resp:    17  Temp:   98.3 F (36.8 C) 99 F (37.2 C)  TempSrc:   Oral Oral  SpO2: 98% 99%  99%  Weight:    (!) 376 lb 8 oz (170.8 kg)  Height:    5' 6.5" (1.689 m)   Eyes: PERRLA, EOMI, no conjunctival pallor ENMT: MMM, moon face Respiratory: CTAB, no wheezing, rales Cardiovascular: RR, NR, no murmur noted Abdomen: Obese, +BS, TTP over epigastrium  and RUQ.  Otherwise no distention or pain Musculoskeletal: No clubbing or cyanosis.  Normal muscle tone in LE Skin: some psoriatic plaques, small on feet and legs.  No acute rash Psychiatric: Pleasant, normal mood  Labs on Admission: I have personally reviewed following labs and imaging studies  CBC:  Recent Labs Lab 02/20/16 1537  WBC 12.8*  HGB 13.4  HCT 42.1  MCV 80.0  PLT 333   Basic Metabolic Panel:  Recent Labs Lab 02/20/16 1537  NA 138  K 4.3  CL 102  CO2 27  GLUCOSE 101*  BUN 12  CREATININE 0.96  CALCIUM 9.0   GFR: Estimated Creatinine Clearance: 127.3 mL/min (by C-G formula based on SCr of 0.96 mg/dL). Liver Function Tests:  Recent Labs Lab 02/20/16 1537  AST 20  ALT 21  ALKPHOS 81  BILITOT 0.6  PROT 7.8  ALBUMIN 3.5    Recent Labs Lab 02/20/16 1537  LIPASE 33   Urine analysis:    Component Value Date/Time   COLORURINE YELLOW 02/20/2016 1740     APPEARANCEUR HAZY (A) 02/20/2016 1740   LABSPEC 1.022 02/20/2016 1740   PHURINE 5.0 02/20/2016 1740   GLUCOSEU NEGATIVE 02/20/2016 1740   HGBUR NEGATIVE 02/20/2016 1740   BILIRUBINUR NEGATIVE 02/20/2016 1740   KETONESUR NEGATIVE 02/20/2016 1740   PROTEINUR NEGATIVE 02/20/2016 1740   UROBILINOGEN 0.2 01/20/2010 1241   NITRITE NEGATIVE 02/20/2016 1740   LEUKOCYTESUR SMALL (A) 02/20/2016 1740   Radiological Exams on Admission: Ct Abdomen Pelvis W Contrast  Result Date: 02/20/2016 CLINICAL DATA:  Epigastric pain radiating to the right flank for several days, initial encounter EXAM: CT ABDOMEN AND PELVIS WITH CONTRAST TECHNIQUE: Multidetector CT imaging of the abdomen and pelvis was performed using the standard protocol following bolus administration of intravenous contrast. CONTRAST:  100 mL ISOVUE-300 IOPAMIDOL (ISOVUE-300) INJECTION 61% COMPARISON:  None. FINDINGS: Lower chest: No acute abnormality. Hepatobiliary: No focal liver abnormality is seen. Status post cholecystectomy. No biliary dilatation. Pancreas: Some heterogeneity is noted in the region of the head of the pancreas with some peripancreatic inflammatory changes. Scattered peripancreatic lymph nodes are noted. 11 mm node in the portacaval space is noted. Spleen: Normal in size without focal abnormality. Adrenals/Urinary Tract: Adrenal glands are unremarkable. Kidneys are normal, without renal calculi, focal lesion, or hydronephrosis. Bladder is unremarkable. Stomach/Bowel: Stomach is within normal limits. Appendix appears normal. No evidence of bowel wall thickening, distention, or inflammatory changes. Vascular/Lymphatic: No significant vascular findings are present. No enlarged abdominal or pelvic lymph nodes. Reproductive: Uterus and bilateral adnexa are unremarkable. Other: No abdominal wall hernia or abnormality. No abdominopelvic ascites. Musculoskeletal: No acute or significant osseous findings. IMPRESSION: Mild peripancreatic  inflammatory change with heterogeneity in the region of the pancreatic head. Changes are consistent with focal acute pancreatitis with adjacent lymphadenopathy. Electronically Signed   By: Alcide Clever M.D.   On: 02/20/2016 19:19     Assessment/Plan Acute pancreatitis - Lipase normal, but pancreas with inflammation on CT scan - Treat as pancreatitis at this time, monitor for acute changes - Repeat LFTs in the AM - Mild leukocytosis, trend - She does not have a GB, she is not an ETOH user.  Check lipid panel for triglycerides - IVF with NS at 125cc/hr overnight  - NPO - Ice chips okay - Consider clears in the AM if she is improved - Morphine PRN for pain, she is allergic to dilaudid (nausea)  Psoriasis - Takes Stelara, and recently had an injection.  - I am curious  if she has ever been on high dose steroids as she has a body habitus that could fit with a cushingoid type such as those on long term steroids.  She denies current steroid use.     Celiac disease - She tries to avoid gluten but is not strict about it - Currently constipated - Colace - PRN senna   DVT prophylaxis: Heparin SQ Code Status: Full Family Communication: Sister Misty StanleyLisa at bedside Disposition Plan: Admit, monitor for improvement, discharge in 1-2 days Admission status: Observation, Dwana Melenamed-surg   Ido Wollman MD [facility] Pager [phone]- [phone]  If 7PM-7AM, please contact night-coverage [email or web address] Password [email or web address]  02/20/2016, 9:37 PM

## 2016-02-21 DIAGNOSIS — K8500 Idiopathic acute pancreatitis without necrosis or infection: Secondary | ICD-10-CM

## 2016-02-21 DIAGNOSIS — K219 Gastro-esophageal reflux disease without esophagitis: Secondary | ICD-10-CM

## 2016-02-21 DIAGNOSIS — L409 Psoriasis, unspecified: Secondary | ICD-10-CM | POA: Diagnosis not present

## 2016-02-21 DIAGNOSIS — K859 Acute pancreatitis without necrosis or infection, unspecified: Secondary | ICD-10-CM | POA: Diagnosis not present

## 2016-02-21 DIAGNOSIS — K900 Celiac disease: Secondary | ICD-10-CM

## 2016-02-21 LAB — COMPREHENSIVE METABOLIC PANEL
ALT: 20 U/L (ref 14–54)
ANION GAP: 7 (ref 5–15)
AST: 17 U/L (ref 15–41)
Albumin: 3.1 g/dL — ABNORMAL LOW (ref 3.5–5.0)
Alkaline Phosphatase: 82 U/L (ref 38–126)
BUN: 11 mg/dL (ref 6–20)
CHLORIDE: 104 mmol/L (ref 101–111)
CO2: 27 mmol/L (ref 22–32)
Calcium: 8.4 mg/dL — ABNORMAL LOW (ref 8.9–10.3)
Creatinine, Ser: 0.95 mg/dL (ref 0.44–1.00)
Glucose, Bld: 139 mg/dL — ABNORMAL HIGH (ref 65–99)
POTASSIUM: 3.7 mmol/L (ref 3.5–5.1)
SODIUM: 138 mmol/L (ref 135–145)
Total Bilirubin: 0.5 mg/dL (ref 0.3–1.2)
Total Protein: 7.1 g/dL (ref 6.5–8.1)

## 2016-02-21 LAB — CBC
HCT: 38.6 % (ref 36.0–46.0)
HEMOGLOBIN: 12 g/dL (ref 12.0–15.0)
MCH: 24.8 pg — AB (ref 26.0–34.0)
MCHC: 31.1 g/dL (ref 30.0–36.0)
MCV: 79.9 fL (ref 78.0–100.0)
Platelets: 299 10*3/uL (ref 150–400)
RBC: 4.83 MIL/uL (ref 3.87–5.11)
RDW: 16.4 % — ABNORMAL HIGH (ref 11.5–15.5)
WBC: 13.5 10*3/uL — AB (ref 4.0–10.5)

## 2016-02-21 LAB — LIPID PANEL
CHOL/HDL RATIO: 2.8 ratio
Cholesterol: 103 mg/dL (ref 0–200)
HDL: 37 mg/dL — AB (ref >40)
LDL Cholesterol: 51 mg/dL (ref 0–99)
Triglycerides: 76 mg/dL (ref <150)
VLDL: 15 mg/dL (ref 0–40)

## 2016-02-21 MED ORDER — HYDRALAZINE HCL 20 MG/ML IJ SOLN: 10.0000 mg | Freq: Three times a day (TID) | INTRAMUSCULAR | Status: DC | PRN

## 2016-02-21 MED ORDER — INFLUENZA VAC SPLIT QUAD 0.5 ML IM SUSY: 0.5000 mL | PREFILLED_SYRINGE | INTRAMUSCULAR | Status: DC

## 2016-02-21 MED ORDER — POLYETHYLENE GLYCOL 3350 17 G PO PACK
17.0000 g | PACK | Freq: Every day | ORAL | Status: DC
  Filled 2016-02-21: qty 1

## 2016-02-21 MED ORDER — PANTOPRAZOLE SODIUM 40 MG PO TBEC
40.0000 mg | DELAYED_RELEASE_TABLET | Freq: Every day | ORAL | Status: DC
  Administered 2016-02-22: 40 mg via ORAL
  Filled 2016-02-21: qty 1

## 2016-02-21 NOTE — Progress Notes (Signed)
[facility] PROGRESS NOTE  April Gonzales [id number] DOB: 06-Feb-1976 DOA: 02/20/2016 PCP: Filomena JunglingEDWARDS, JERRY, NP  Interim summary and HPI 40 y.o. female with medical history significant of borderline DM (no meds), borderline HTN (no meds), celiac disease, psoriasis who presents for epigastric pain.  She notes that the pain started Wednesday and was moderate. She thought it was gas pain and took some gas medication for it and some ibuprofen which did not help.  The pain worsened to the point that she could not lie down and so she came to be seen.  The pain was 10/10 at it's worst.  She reports nothing made the pain any better until the morphine she got in the ED.  She had some nausea, but no emesis. She ate yesterday, but when she tried to eat soup today her pain got much worse. She has had subjective fevers, but no chills.  Further symptoms includes constipation.  She has had no blood loss, change in bowel habits, chest pain, SOB, urinary symptoms.  She has had her gallbladder out, but she cannot remember why as it was a long time ago.  She does not drink ETOH.  She does not know if she has been checked for cholesterol issues, but her father has high cholesterol.     Assessment/Plan: 1-acute epigastric pain: due to acute pancreatitis -idiopathic -no abnormalities on gallbladder, normal TG, no hx of alcohol abuse -no medications that can lead to pancreatitis appreciated -will continue supportive care and PRN analgesics -will advance diet and start PPI (in case there is component of gastritis with obesity)  2-obesity: -Body mass index is 59.86 kg/m. -low calorie diet and exercise discussed with patient  3-elevated BP: w/o hx of HTN -will monitor and use PRN hydralazine while inpatient  -patient advise to follow heart healthy diet -if needed will start antihypertensive agents -difficult to assess properly in setting of pain  4-hx of psoriasis -continue stelara at discharge  5-Celiac  disease: with constipation -will use colace and miralax -advise to maintain good hydration and increase fiber intake. -patient is currently following gluten free diet (or at least that is what she said)  Code Status: Full Family Communication: no family at bedisde Disposition Plan: home when medically stable; will advance diet and most likely home in am if tolerated.   Consultants:  None   Procedures:  See below for x-ray reports   Antibiotics:  None   HPI/Subjective: Afebrile, no CP, no SOB, reports feeling better and will like to have diet advance. No nausea, no vomiting   Objective: Vitals:   02/21/16 0422 02/21/16 1322  BP: (!) 168/88 (!) 169/80  Pulse: 79 72  Resp: 16   Temp: 97.9 F (36.6 C) 99.1 F (37.3 C)    Intake/Output Summary (Last 24 hours) at 02/21/16 1718 Last data filed at 02/21/16 1500  Gross per 24 hour  Intake             3250 ml  Output              350 ml  Net             2900 ml   Filed Weights   02/20/16 1532 02/20/16 1534 02/20/16 2047  Weight: (!) 169.2 kg (373 lb) (!) 169.3 kg (373 lb 3 oz) (!) 170.8 kg (376 lb 8 oz)    Exam:   General:  Obese, in no distress. Reports improvement in his abd pain and deneis CP and SOB. No nausea  or vomiting. Will like to have diet advance.  Cardiovascular: S1 and S2, no rubs, no gallops  Respiratory: CTA bilaterally  Abdomen: mid epigastric tenderness on palpations, obese, soft abdomen, no guarding, no distension   Musculoskeletal: trace edema bilaterally  Data Reviewed: Basic Metabolic Panel:  Recent Labs Lab 02/20/16 1537 02/21/16 0024  NA 138 138  K 4.3 3.7  CL 102 104  CO2 27 27  GLUCOSE 101* 139*  BUN 12 11  CREATININE 0.96 0.95  CALCIUM 9.0 8.4*   Liver Function Tests:  Recent Labs Lab 02/20/16 1537 02/21/16 0024  AST 20 17  ALT 21 20  ALKPHOS 81 82  BILITOT 0.6 0.5  PROT 7.8 7.1  ALBUMIN 3.5 3.1*    Recent Labs Lab 02/20/16 1537  LIPASE 33   CBC:  Recent  Labs Lab 02/20/16 1537 02/21/16 0024  WBC 12.8* 13.5*  HGB 13.4 12.0  HCT 42.1 38.6  MCV 80.0 79.9  PLT 333 299   Studies: Ct Abdomen Pelvis W Contrast  Result Date: 02/20/2016 CLINICAL DATA:  Epigastric pain radiating to the right flank for several days, initial encounter EXAM: CT ABDOMEN AND PELVIS WITH CONTRAST TECHNIQUE: Multidetector CT imaging of the abdomen and pelvis was performed using the standard protocol following bolus administration of intravenous contrast. CONTRAST:  100 mL ISOVUE-300 IOPAMIDOL (ISOVUE-300) INJECTION 61% COMPARISON:  None. FINDINGS: Lower chest: No acute abnormality. Hepatobiliary: No focal liver abnormality is seen. Status post cholecystectomy. No biliary dilatation. Pancreas: Some heterogeneity is noted in the region of the head of the pancreas with some peripancreatic inflammatory changes. Scattered peripancreatic lymph nodes are noted. 11 mm node in the portacaval space is noted. Spleen: Normal in size without focal abnormality. Adrenals/Urinary Tract: Adrenal glands are unremarkable. Kidneys are normal, without renal calculi, focal lesion, or hydronephrosis. Bladder is unremarkable. Stomach/Bowel: Stomach is within normal limits. Appendix appears normal. No evidence of bowel wall thickening, distention, or inflammatory changes. Vascular/Lymphatic: No significant vascular findings are present. No enlarged abdominal or pelvic lymph nodes. Reproductive: Uterus and bilateral adnexa are unremarkable. Other: No abdominal wall hernia or abnormality. No abdominopelvic ascites. Musculoskeletal: No acute or significant osseous findings. IMPRESSION: Mild peripancreatic inflammatory change with heterogeneity in the region of the pancreatic head. Changes are consistent with focal acute pancreatitis with adjacent lymphadenopathy. Electronically Signed   By: Alcide CleverMark  Lukens M.D.   On: 02/20/2016 19:19    Scheduled Meds: . docusate sodium  100 mg Oral BID  . heparin  5,000 Units  Subcutaneous Q8H  . [START ON 02/22/2016] Influenza vac split quadrivalent PF  0.5 mL Intramuscular Tomorrow-1000  . polyethylene glycol  17 g Oral Daily   Continuous Infusions:   Active Problems:   Acute pancreatitis without infection or necrosis   Pancreatitis   Psoriasis   Celiac disease    Time spent: 25 minutes    Vassie LollMadera, Rohin Krejci  [facility] Pager [phone]. If 7PM-7AM, please contact night-coverage at [email or web address], password [email or web address] 02/21/2016, 5:18 PM  LOS: 0 days

## 2016-02-22 DIAGNOSIS — K859 Acute pancreatitis without necrosis or infection, unspecified: Secondary | ICD-10-CM

## 2016-02-22 DIAGNOSIS — K219 Gastro-esophageal reflux disease without esophagitis: Secondary | ICD-10-CM

## 2016-02-22 DIAGNOSIS — L409 Psoriasis, unspecified: Secondary | ICD-10-CM | POA: Diagnosis not present

## 2016-02-22 DIAGNOSIS — R030 Elevated blood-pressure reading, without diagnosis of hypertension: Secondary | ICD-10-CM

## 2016-02-22 DIAGNOSIS — K900 Celiac disease: Secondary | ICD-10-CM | POA: Diagnosis not present

## 2016-02-22 LAB — COMPREHENSIVE METABOLIC PANEL
ALBUMIN: 2.8 g/dL — AB (ref 3.5–5.0)
ALK PHOS: 63 U/L (ref 38–126)
ALT: 17 U/L (ref 14–54)
AST: 17 U/L (ref 15–41)
Anion gap: 6 (ref 5–15)
BILIRUBIN TOTAL: 0.7 mg/dL (ref 0.3–1.2)
BUN: 8 mg/dL (ref 6–20)
CALCIUM: 8.4 mg/dL — AB (ref 8.9–10.3)
CO2: 27 mmol/L (ref 22–32)
Chloride: 104 mmol/L (ref 101–111)
Creatinine, Ser: 0.85 mg/dL (ref 0.44–1.00)
GFR calc non Af Amer: >60 mL/min (ref >60)
Glucose, Bld: 113 mg/dL — ABNORMAL HIGH (ref 65–99)
POTASSIUM: 3.7 mmol/L (ref 3.5–5.1)
SODIUM: 137 mmol/L (ref 135–145)
TOTAL PROTEIN: 6.6 g/dL (ref 6.5–8.1)

## 2016-02-22 LAB — LIPASE, BLOOD: Lipase: 20 U/L (ref 11–51)

## 2016-02-22 LAB — HEMOGLOBIN A1C
HEMOGLOBIN A1C: 6.6 % — AB (ref 4.8–5.6)
Mean Plasma Glucose: 143 mg/dL

## 2016-02-22 MED ORDER — DOCUSATE SODIUM 100 MG PO CAPS: 100.0000 mg | ORAL_CAPSULE | Freq: Two times a day (BID) | ORAL | 0 refills | Status: DC

## 2016-02-22 MED ORDER — PANTOPRAZOLE SODIUM 40 MG PO TBEC: 40.0000 mg | DELAYED_RELEASE_TABLET | Freq: Every day | ORAL | 1 refills | Status: DC

## 2016-02-22 MED ORDER — POLYETHYLENE GLYCOL 3350 17 G PO PACK: 17.0000 g | PACK | Freq: Every day | ORAL | 0 refills | Status: DC

## 2016-02-22 NOTE — Progress Notes (Signed)
Patient discharged to home with instructions. 

## 2016-02-22 NOTE — Discharge Summary (Signed)
Physician Discharge Summary  Harvel RicksJulie A Kleinfelter [id number] DOB: [birth year] DOA: 02/20/2016  PCP: Filomena JunglingEDWARDS, JERRY, NP  Admit date: 02/20/2016 Discharge date: 02/22/2016  Time spent: 30 minutes  Recommendations for Outpatient Follow-up:  1. Reassess BP and initiate antihypertensive regimen if needed    Discharge Diagnoses:  Active Problems:   Acute pancreatitis without infection or necrosis   Pancreatitis   Psoriasis   Celiac disease   Morbid obesity (HCC)   Elevated BP without diagnosis of hypertension   Gastroesophageal reflux disease   Discharge Condition: stable and improved. Instructed to follow up with PCP in 10 days  Diet recommendation: low calorie and lowfat/heart healthy diet   Filed Weights   02/20/16 1532 02/20/16 1534 02/20/16 2047  Weight: (!) 169.2 kg (373 lb) (!) 169.3 kg (373 lb 3 oz) (!) 170.8 kg (376 lb 8 oz)    History of present illness:  40 y.o.femalewith medical history significant of borderline DM (no meds), borderline HTN (no meds), celiac disease, psoriasis who presents for epigastric pain. She notes that the pain started Wednesday and was moderate. She thought it was gas pain and took some gas medication for it and some ibuprofen which did not help. The pain worsened to the point that she could not lie down and so she came to be seen. The pain was 10/10 at it's worst. She reports nothing made the pain any better until the morphine she got in the ED. She had some nausea, but no emesis. She ate yesterday, but when she tried to eat soup today her pain got much worse. She has had subjective fevers, but no chills. Further symptoms includes constipation. She has had no blood loss, change in bowel habits, chest pain, SOB, urinary symptoms. She has had her gallbladder out, but she cannot remember why as it was a long time ago. She does not drink ETOH. She does not know if she has been checked for cholesterol issues, but her father has high cholesterol.    Hospital Course:  1-acute epigastric pain: due to acute pancreatitis -idiopathic -no abnormalities on gallbladder, normal TG, no hx of alcohol abuse -no medications that can lead to pancreatitis appreciated -pain improved/resolved -lipase and LFT's WNL at discharge -diet advance to low fat diet and well tolerated  2-obesity: -Body mass index is 59.86 kg/m. -low calorie diet and exercise discussed with patient  3-elevated BP: w/o hx of HTN -patient advise to follow heart healthy diet -will need follow up with PCP and if BP remains elevated will need initiation of antihypertensive regimen   4-hx of psoriasis -continue stelara at discharge  5-Celiac disease: with constipation at this point -will discharge on colace and miralax -advise to maintain good hydration and increase fiber intake. -patient is currently following gluten free diet (or at least that is what she said); which was encouraged  6-GERD: with concerns of gastritis due to use of NSAID's -will discharge on PPI -obesity can also contribute as risk factor for GERD  Procedures:  See below for x-ray reports   Consultations:  None  Discharge Exam: Vitals:   02/22/16 0606 02/22/16 1459  BP: (!) 162/90 (!) 149/76  Pulse: 70 77  Resp:    Temp: 98.2 F (36.8 C) 99.3 F (37.4 C)    General:  Obese, in no distress. Reports resolution of her abd pain, no nausea, no vomtiing and been able to tolerate regular diet.   Cardiovascular: S1 and S2, no rubs, no gallops  Respiratory: CTA bilaterally  Abdomen: no  tenderness, positive BS, no guarding    Musculoskeletal: trace edema bilaterally   Discharge Instructions   Discharge Instructions    Diet - low sodium heart healthy    Complete by:  As directed    Discharge instructions    Complete by:  As directed    Take medications as prescribed Keep yourself well hydrated Please follow up with PCP in 10 days Follow low calorie and heart healthy diet      Current Discharge Medication List    START taking these medications   Details  docusate sodium (COLACE) 100 MG capsule Take 1 capsule (100 mg total) by mouth 2 (two) times daily. Qty: 60 capsule, Refills: 0    pantoprazole (PROTONIX) 40 MG tablet Take 1 tablet (40 mg total) by mouth daily at 12 noon. Qty: 30 tablet, Refills: 1    polyethylene glycol (MIRALAX / GLYCOLAX) packet Take 17 g by mouth daily. Qty: 28 each, Refills: 0      CONTINUE these medications which have NOT CHANGED   Details  ibuprofen (ADVIL,MOTRIN) 200 MG tablet Take 200 mg by mouth every 6 (six) hours as needed for mild pain.    simethicone (MYLICON) 80 MG chewable tablet Chew 80 mg by mouth every 6 (six) hours as needed for flatulence.    STELARA 90 MG/ML SOSY injection Inject 90 mg into the muscle every 3 (three) months.       Allergies  Allergen Reactions  . Dilaudid [Hydromorphone Hcl] Nausea And Vomiting   Follow-up Information    EDWARDS, JERRY, NP. Schedule an appointment as soon as possible for a visit in 10 day(s).   Specialty:  Nurse Practitioner Contact information: [address] [place] [address] [phone]           The results of significant diagnostics from this hospitalization (including imaging, microbiology, ancillary and laboratory) are listed below for reference.    Significant Diagnostic Studies: Ct Abdomen Pelvis W Contrast  Result Date: 02/20/2016 CLINICAL DATA:  Epigastric pain radiating to the right flank for several days, initial encounter EXAM: CT ABDOMEN AND PELVIS WITH CONTRAST TECHNIQUE: Multidetector CT imaging of the abdomen and pelvis was performed using the standard protocol following bolus administration of intravenous contrast. CONTRAST:  100 mL ISOVUE-300 IOPAMIDOL (ISOVUE-300) INJECTION 61% COMPARISON:  None. FINDINGS: Lower chest: No acute abnormality. Hepatobiliary: No focal liver abnormality is seen. Status post cholecystectomy. No biliary  dilatation. Pancreas: Some heterogeneity is noted in the region of the head of the pancreas with some peripancreatic inflammatory changes. Scattered peripancreatic lymph nodes are noted. 11 mm node in the portacaval space is noted. Spleen: Normal in size without focal abnormality. Adrenals/Urinary Tract: Adrenal glands are unremarkable. Kidneys are normal, without renal calculi, focal lesion, or hydronephrosis. Bladder is unremarkable. Stomach/Bowel: Stomach is within normal limits. Appendix appears normal. No evidence of bowel wall thickening, distention, or inflammatory changes. Vascular/Lymphatic: No significant vascular findings are present. No enlarged abdominal or pelvic lymph nodes. Reproductive: Uterus and bilateral adnexa are unremarkable. Other: No abdominal wall hernia or abnormality. No abdominopelvic ascites. Musculoskeletal: No acute or significant osseous findings. IMPRESSION: Mild peripancreatic inflammatory change with heterogeneity in the region of the pancreatic head. Changes are consistent with focal acute pancreatitis with adjacent lymphadenopathy. Electronically Signed   By: Alcide Clever M.D.   On: 02/20/2016 19:19    Labs: Basic Metabolic Panel:  Recent Labs Lab 02/20/16 1537 02/21/16 0024 02/22/16 0515  NA 138 138 137  K 4.3 3.7 3.7  CL 102 104  104  CO2 27 27 27   GLUCOSE 101* 139* 113*  BUN 12 11 8   CREATININE 0.96 0.95 0.85  CALCIUM 9.0 8.4* 8.4*   Liver Function Tests:  Recent Labs Lab 02/20/16 1537 02/21/16 0024 02/22/16 0515  AST 20 17 17   ALT 21 20 17   ALKPHOS 81 82 63  BILITOT 0.6 0.5 0.7  PROT 7.8 7.1 6.6  ALBUMIN 3.5 3.1* 2.8*    Recent Labs Lab 02/20/16 1537 02/22/16 0515  LIPASE 33 20   CBC:  Recent Labs Lab 02/20/16 1537 02/21/16 0024  WBC 12.8* 13.5*  HGB 13.4 12.0  HCT 42.1 38.6  MCV 80.0 79.9  PLT 333 299    Signed:  Vassie LollMadera, Ramsay Bognar MD.  [facility] 02/22/2016, 4:27 PM

## 2016-08-12 ENCOUNTER — Other Ambulatory Visit: Payer: Self-pay | Admitting: Orthopaedic Surgery

## 2016-08-12 DIAGNOSIS — M4306 Spondylolysis, lumbar region: Secondary | ICD-10-CM

## 2016-09-02 ENCOUNTER — Other Ambulatory Visit: Payer: 59

## 2016-09-06 ENCOUNTER — Ambulatory Visit
Admission: RE | Admit: 2016-09-06 | Discharge: 2016-09-06 | Disposition: A | Discharge status: Home / Self Care | Payer: 59 | Source: Ambulatory Visit | Attending: Orthopaedic Surgery | Admitting: Orthopaedic Surgery

## 2016-09-06 DIAGNOSIS — M4306 Spondylolysis, lumbar region: Secondary | ICD-10-CM

## 2019-04-06 ENCOUNTER — Emergency Department (HOSPITAL_COMMUNITY): Payer: 59

## 2019-04-06 ENCOUNTER — Encounter (HOSPITAL_COMMUNITY): Payer: Self-pay | Admitting: Emergency Medicine

## 2019-04-06 ENCOUNTER — Other Ambulatory Visit: Payer: Self-pay

## 2019-04-06 ENCOUNTER — Emergency Department (HOSPITAL_COMMUNITY)
Admission: EM | Admit: 2019-04-06 | Discharge: 2019-04-06 | Discharge status: Against Medical Advice | Payer: 59 | Attending: Emergency Medicine | Admitting: Emergency Medicine

## 2019-04-06 ENCOUNTER — Emergency Department (HOSPITAL_COMMUNITY)
Admission: EM | Admit: 2019-04-06 | Discharge: 2019-04-07 | Disposition: A | Discharge status: Home / Self Care | Payer: 59 | Source: Home / Self Care | Attending: Emergency Medicine | Admitting: Emergency Medicine

## 2019-04-06 ENCOUNTER — Encounter (HOSPITAL_COMMUNITY): Payer: Self-pay

## 2019-04-06 DIAGNOSIS — M545 Low back pain, unspecified: Secondary | ICD-10-CM

## 2019-04-06 DIAGNOSIS — Y929 Unspecified place or not applicable: Secondary | ICD-10-CM | POA: Diagnosis not present

## 2019-04-06 DIAGNOSIS — W1830XA Fall on same level, unspecified, initial encounter: Secondary | ICD-10-CM | POA: Diagnosis not present

## 2019-04-06 DIAGNOSIS — R21 Rash and other nonspecific skin eruption: Secondary | ICD-10-CM | POA: Insufficient documentation

## 2019-04-06 DIAGNOSIS — M5417 Radiculopathy, lumbosacral region: Secondary | ICD-10-CM

## 2019-04-06 DIAGNOSIS — Y9301 Activity, walking, marching and hiking: Secondary | ICD-10-CM | POA: Insufficient documentation

## 2019-04-06 DIAGNOSIS — Y999 Unspecified external cause status: Secondary | ICD-10-CM | POA: Insufficient documentation

## 2019-04-06 DIAGNOSIS — R200 Anesthesia of skin: Secondary | ICD-10-CM | POA: Insufficient documentation

## 2019-04-06 LAB — CBC WITH DIFFERENTIAL/PLATELET
Abs Immature Granulocytes: 0.12 10*3/uL — ABNORMAL HIGH (ref 0.00–0.07)
Basophils Absolute: 0 10*3/uL (ref 0.0–0.1)
Basophils Relative: 0 %
Eosinophils Absolute: 0 10*3/uL (ref 0.0–0.5)
Eosinophils Relative: 0 %
HCT: 43.2 % (ref 36.0–46.0)
Hemoglobin: 13.5 g/dL (ref 12.0–15.0)
Immature Granulocytes: 1 %
Lymphocytes Relative: 7 %
Lymphs Abs: 1.1 10*3/uL (ref 0.7–4.0)
MCH: 25.7 pg — ABNORMAL LOW (ref 26.0–34.0)
MCHC: 31.3 g/dL (ref 30.0–36.0)
MCV: 82.1 fL (ref 80.0–100.0)
Monocytes Absolute: 0.5 10*3/uL (ref 0.1–1.0)
Monocytes Relative: 3 %
Neutro Abs: 13.7 10*3/uL — ABNORMAL HIGH (ref 1.7–7.7)
Neutrophils Relative %: 89 %
Platelets: 343 10*3/uL (ref 150–400)
RBC: 5.26 MIL/uL — ABNORMAL HIGH (ref 3.87–5.11)
RDW: 15.8 % — ABNORMAL HIGH (ref 11.5–15.5)
WBC: 15.5 10*3/uL — ABNORMAL HIGH (ref 4.0–10.5)
nRBC: 0 % (ref 0.0–0.2)

## 2019-04-06 LAB — BASIC METABOLIC PANEL
Anion gap: 10 (ref 5–15)
BUN: 18 mg/dL (ref 6–20)
CO2: 24 mmol/L (ref 22–32)
Calcium: 9.1 mg/dL (ref 8.9–10.3)
Chloride: 103 mmol/L (ref 98–111)
Creatinine, Ser: 0.9 mg/dL (ref 0.44–1.00)
GFR calc Af Amer: >60 mL/min (ref >60)
GFR calc non Af Amer: >60 mL/min (ref >60)
Glucose, Bld: 181 mg/dL — ABNORMAL HIGH (ref 70–99)
Potassium: 4.6 mmol/L (ref 3.5–5.1)
Sodium: 137 mmol/L (ref 135–145)

## 2019-04-06 MED ORDER — ENOXAPARIN SODIUM 300 MG/3ML IJ SOLN: 170.0000 mg | INTRAMUSCULAR | Status: DC

## 2019-04-06 MED ORDER — ENOXAPARIN SODIUM 100 MG/ML ~~LOC~~ SOLN
100.0000 mg | SUBCUTANEOUS | Status: AC
  Administered 2019-04-06: 100 mg via SUBCUTANEOUS
  Filled 2019-04-06: qty 1

## 2019-04-06 MED ORDER — LORAZEPAM 1 MG PO TABS
1.0000 mg | ORAL_TABLET | Freq: Once | ORAL | Status: AC
  Administered 2019-04-06: 13:00:00 1 mg via ORAL
  Filled 2019-04-06: qty 1

## 2019-04-06 MED ORDER — ENOXAPARIN SODIUM 300 MG/3ML IJ SOLN: 1.0000 mg/kg | Freq: Once | INTRAMUSCULAR | Status: DC

## 2019-04-06 MED ORDER — ENOXAPARIN SODIUM 80 MG/0.8ML ~~LOC~~ SOLN
70.0000 mg | SUBCUTANEOUS | Status: AC
  Administered 2019-04-06: 23:00:00 70 mg via SUBCUTANEOUS
  Filled 2019-04-06: qty 0.7

## 2019-04-06 NOTE — ED Notes (Signed)
ED Provider at bedside. 

## 2019-04-06 NOTE — ED Provider Notes (Signed)
[facility]-EMERGENCY DEPT Provider Note   CSN: [id number] Arrival date & time: 04/06/19  2129     History Chief Complaint  Patient presents with  . Leg Pain    thinks she has a DVT    April RicksJulie A Gonzales is a 43 y.o. female.  The history is provided by the patient and medical records. No language interpreter was used.  Leg Pain      43 year old morbidly obese female with prior history of spine surgery 10 years ago who presenting complaining of left leg pain with concern for potential DVT.  Patient was seen earlier this morning in the ER for pain to her lower back and recurrent falls.  She also endorsed numbness and weakness to her left lower extremity.  She has been using a walker to ambulate but felt unable to get up.  She did not complain of bowel bladder incontinence or bladder retention.  No complaints of fever night sweats cough or abdominal pain.  During her ER evaluation, an MRI of her low back demonstrate new disc fusion at the level of L3-4 and left L4 nerve root impingement.  Patient was recommended to continue prednisone, meloxicam and a neurosurgeon, Dr. Noel Geroldohen office will contact her for follow-up in the next several days.  Patient states was discharged home, she reported having another fall when she slid onto her buttocks.  She also complaining of swelling and heat coming from her left leg and states she is concerned for potential DVT.  She denies any prior history of DVT in the past.  Past Medical History:  Diagnosis Date  . Celiac disease   . Psoriasis     Patient Active Problem List   Diagnosis Date Noted  . Morbid obesity (HCC)   . Elevated BP without diagnosis of hypertension   . Gastroesophageal reflux disease   . Acute pancreatitis without infection or necrosis 02/20/2016  . Pancreatitis 02/20/2016  . Psoriasis   . Celiac disease     Past Surgical History:  Procedure Laterality Date  . CHOLECYSTECTOMY       OB History   No obstetric  history on file.     Family History  Problem Relation Age of Onset  . Diabetes Mellitus II Mother   . Hypertension Mother   . CAD Father   . Chronic Renal Failure Father   . Hyperlipidemia Father     Social History   Tobacco Use  . Smoking status: Never Smoker  . Smokeless tobacco: Never Used  Substance Use Topics  . Alcohol use: No  . Drug use: No    Home Medications Prior to Admission medications   Medication Sig Start Date End Date Taking? Authorizing Provider  ibuprofen (ADVIL,MOTRIN) 200 MG tablet Take 400 mg by mouth every 6 (six) hours as needed for mild pain or moderate pain.     [provider]  meloxicam (MOBIC) 15 MG tablet Take 15 mg by mouth daily. 04/05/19   [provider]  predniSONE (STERAPRED UNI-PAK 21 TAB) 5 MG (21) TBPK tablet Take 5-30 mg by mouth See admin instructions. Taper as directed 6-5-4-3-2-1 04/05/19   [provider]  STELARA 90 MG/ML SOSY injection Inject 90 mg into the muscle every 3 (three) months. 02/06/16   [provider]  triamcinolone cream (KENALOG) 0.1 % Apply 1 application topically 2 (two) times daily as needed (psoriasis).  01/26/18   [provider]    Allergies    Dilaudid [hydromorphone hcl]  Review  of Systems   Review of Systems  Musculoskeletal: Positive for arthralgias.  Neurological: Positive for numbness.  All other systems reviewed and are negative.   Physical Exam Updated Vital Signs BP (!) 167/84 (BP Location: Left Arm)   Pulse 90   Temp 98 F (36.7 C) (Oral)   Resp 19   Ht 5\' 8"  (1.727 m)   Wt (!) 170.1 kg   SpO2 97%   BMI 57.02 kg/m   Physical Exam Vitals and nursing note reviewed.  Constitutional:      General: She is not in acute distress.    Appearance: She is well-developed. She is obese.     Comments: Morbidly obese  HENT:     Head: Atraumatic.  Eyes:     Conjunctiva/sclera: Conjunctivae normal.  Musculoskeletal:     Cervical back: Neck supple.      Comments: Left lower extremity: Mild diffuse tenderness throughout leg without significant edema, no obvious calf tenderness.  Patella deep tendon reflex 1+, no foot drops.  No erythema or warmth noted  Skin:    Findings: No rash.  Neurological:     Mental Status: She is alert.     ED Results / Procedures / Treatments   Labs (all labs ordered are listed, but only abnormal results are displayed) Labs Reviewed - No data to display  EKG None  Radiology MR LUMBAR SPINE WO CONTRAST  Result Date: 04/06/2019 CLINICAL DATA:  Low back pain. Left leg weakness and numbness for 1 year with a fall today. Prior lumbar surgery. EXAM: MRI LUMBAR SPINE WITHOUT CONTRAST TECHNIQUE: Multiplanar, multisequence MR imaging of the lumbar spine was performed. No intravenous contrast was administered. COMPARISON:  01/10/2010 FINDINGS: Segmentation: For consistency with the previous MRI report, the lowest fully formed disc space is designated L5-S1. There are bilateral ribs at L1. Alignment:  Normal. Vertebrae: No acute fracture, suspicious marrow lesion, or evidence of discitis. Minimal chronic anterior wedging of the T12 vertebral body. Conus medullaris and cauda equina: Conus extends to the L1-2 level. Conus and cauda equina appear normal. Paraspinal and other soft tissues: Postoperative changes in the posterior lower lumbar soft tissues. No fluid collection. Disc levels: Disc desiccation at T12-L1 and from L3-4 to L5-S1 with associated mild disc space narrowing. L1-2: Increased, mild right and moderate left facet hypertrophy without disc herniation or stenosis. L2-3: Mild left greater than right facet hypertrophy without disc herniation or stenosis, unchanged. L3-4: A new moderate-sized left subarticular disc extrusion with caudal migration to the L4 pedicle level disc bulging, mild facet and ligamentum flavum hypertrophy, and congenitally short pedicles result in new mild to moderate spinal stenosis and severe  left lateral recess stenosis with left L4 nerve root impingement. Patent neural foramina. L4-5: Mild disc bulging, mild facet and ligamentum flavum hypertrophy, and congenitally short pedicles result in new borderline to mild bilateral lateral recess stenosis without spinal or neural foraminal stenosis. L5-S1: Interval right laminectomy with epidural fibrosis surrounding the right S1 nerve root in the lateral recess. Disc bulging, endplate spurring, and mild facet hypertrophy result in mild-to-moderate right and moderate left neural foraminal stenosis and borderline to mild bilateral lateral recess stenosis without spinal stenosis. Spinal canal and lateral recess patency is improved following microdiskectomy. IMPRESSION: 1. New disc extrusion at L3-4 with left L4 nerve root impingement in the lateral recess. 2. Interval right laminectomy and microdiscectomy at L5-S1 with improved lateral recess patency. Mild-to-moderate right and moderate left neural foraminal stenosis due to disc bulging and spurring. 3. New  borderline to mild lateral recess stenosis at L4-5. Electronically Signed   By: Sebastian Ache M.D.   On: 04/06/2019 14:17    Procedures Procedures (including critical care time)  Medications Ordered in ED Medications  enoxaparin (LOVENOX) injection 100 mg (100 mg Subcutaneous Given 04/06/19 2300)    And  enoxaparin (LOVENOX) injection 70 mg (70 mg Subcutaneous Given 04/06/19 2300)    ED Course  I have reviewed the triage vital signs and the nursing notes.  Pertinent labs & imaging results that were available during my care of the patient were reviewed by me and considered in my medical decision making (see chart for details).    MDM Rules/Calculators/A&P                      BP (!) 137/91   Pulse 90   Temp 98 F (36.7 C) (Oral)   Resp 18   Ht 5\' 8"  (1.727 m)   Wt (!) 170.1 kg   SpO2 96%   BMI 57.02 kg/m   Final Clinical Impression(s) / ED Diagnoses Final diagnoses:  Low back  pain radiating to left leg    Rx / DC Orders ED Discharge Orders         Ordered    VAS LOWER EXTREMITY VENOUS (DVT)     04/06/19 2314         10:45 PM Patient with left L4 nerve root impingement, diagnosed earlier today on MRI presenting with concerns of DVT to her left leg.  On exam, her leg does not appears to have finding consistent with DVT.  However given her morbid obesity states, she is at increased risk of DVT.  Will give Lovenox here and encourage patient to return tomorrow for venous Doppler study to rule out DVT however my suspicion for DVT is low and I anticipate her leg discomfort is likely secondary to her nerve impingement.  No evidence to suggest cellulitis.  No cauda equina symptoms.   04/08/19, PA-C 04/06/19 2317    04/08/19, MD 04/07/19 [phone]

## 2019-04-06 NOTE — Discharge Instructions (Signed)
You have received blood thinner medication for potential blood clot of your left leg.  Please return tomorrow to the check in area and request for venous doppler study of your left lower extremity to rule out blood clot.  Follow up with Dr. Patrice Paradise on Monday for further management of your pinch nerve causing left leg pain and weakness.

## 2019-04-06 NOTE — TOC Transition Note (Signed)
Transition of Care [facility]) - CM/SW Discharge Note   Patient Details  Name: April Gonzales MRN: [id number] Date of Birth: [birth year]  Transition of Care [facility]) CM/SW Contact:  Vergie Living, LCSW Phone Number: 04/06/2019, 3:18 PM   Clinical Narrative:  EDCSW called Bethanne Ginger to order wheelchair and 3n1 to be delivered to Pts home     Final next level of care: Home/Self Care Barriers to Discharge: Barriers Resolved   Patient Goals and CMS Choice        Discharge Placement                       Discharge Plan and Services                DME Arranged: 3-N-1 DME Agency: [employer] Date DME Agency Contacted: 04/06/19 Time DME Agency Contacted: [id number] Representative spoke with at DME Agency: [place] ([place]) Interventions     Readmission Risk Interventions No flowsheet data found.

## 2019-04-06 NOTE — ED Notes (Signed)
[facility] called  

## 2019-04-06 NOTE — Discharge Instructions (Addendum)
Keep taking the prednisone and meloxicam.  Use the walker and wheelchair to make sure that you are stable and to prevent falls.

## 2019-04-06 NOTE — ED Notes (Signed)
Patient transported to MRI 

## 2019-04-06 NOTE — ED Triage Notes (Signed)
Pt arrives via [facility] from home, ems reports pt has been having some L leg weakness and numbness after starting prednisone for her back. Pt stated her leg gave away when she was attempting to leave her apartment and she was unable to get up, no LOC. Pt a/ox4, resp e/u, nad.

## 2019-04-06 NOTE — ED Triage Notes (Signed)
Pt presents to ED via [facility] coming from home. Pt was seen at [facility] earlier today for a fall, was discharged and sent home but has been having redness, heat and swelling to her left leg and is concerned she has a DVT. She had another fall, slid to her bottom no new injuries or pain, after she got home from [employer]. Pt has been having more frequent falls since her pinched nerve in her back causing her leg to go numb and give out.

## 2019-04-06 NOTE — ED Provider Notes (Addendum)
[facility] Provider Note   CSN: [id number] Arrival date & time: 04/06/19  1133     History Chief Complaint  Patient presents with  . Fall    Harvel RicksJulie A Eimer is a 43 y.o. female.  Pt is a 43y/o morbidly obese female with hx of prior spine surgery 10 year ago who started to have low back pain 4 days ago and was using ibuprofen but pain was starting to spread to the left thigh with burning sensation and spoke with her PCP on Wednesday and was prescribed meloxicam and prednisone.  Took first dose yesterday but when she woke up this morning she was having numbness and weakness to the left lower ext from the knee and down.  She was supposed to see Dr. Noel Geroldohen today but was ambulating with a walker and fell and was unable to get up.  She denies fecal or urinary incontinence or retention.  No fever, night sweats, cough abd pain or SOB.  The history is provided by the patient.  Fall This is a new problem. The current episode started less than 1 hour ago. The problem occurs constantly. The problem has not changed since onset.Associated symptoms comments: Low back pain. The symptoms are aggravated by walking. Nothing relieves the symptoms. She has tried nothing for the symptoms. The treatment provided no relief.       Past Medical History:  Diagnosis Date  . Celiac disease   . Psoriasis     Patient Active Problem List   Diagnosis Date Noted  . Morbid obesity (HCC)   . Elevated BP without diagnosis of hypertension   . Gastroesophageal reflux disease   . Acute pancreatitis without infection or necrosis 02/20/2016  . Pancreatitis 02/20/2016  . Psoriasis   . Celiac disease     Past Surgical History:  Procedure Laterality Date  . CHOLECYSTECTOMY       OB History   No obstetric history on file.     Family History  Problem Relation Age of Onset  . Diabetes Mellitus II Mother   . Hypertension Mother   . CAD Father   . Chronic Renal Failure  Father   . Hyperlipidemia Father     Social History   Tobacco Use  . Smoking status: Never Smoker  . Smokeless tobacco: Never Used  Substance Use Topics  . Alcohol use: No  . Drug use: No    Home Medications Prior to Admission medications   Medication Sig Start Date End Date Taking? Authorizing Provider  docusate sodium (COLACE) 100 MG capsule Take 1 capsule (100 mg total) by mouth 2 (two) times daily. 02/22/16   Vassie LollMadera, Carlos, MD  ibuprofen (ADVIL,MOTRIN) 200 MG tablet Take 200 mg by mouth every 6 (six) hours as needed for mild pain.    [provider]  pantoprazole (PROTONIX) 40 MG tablet Take 1 tablet (40 mg total) by mouth daily at 12 noon. 02/23/16   Vassie LollMadera, Carlos, MD  polyethylene glycol [facility](MIRALAX / Ethelene HalGLYCOLAX) packet Take 17 g by mouth daily. 02/23/16   Vassie LollMadera, Carlos, MD  simethicone (MYLICON) 80 MG chewable tablet Chew 80 mg by mouth every 6 (six) hours as needed for flatulence.    [provider]  STELARA 90 MG/ML SOSY injection Inject 90 mg into the muscle every 3 (three) months. 02/06/16   [provider]    Allergies    Dilaudid [hydromorphone hcl]  Review of Systems   Review of Systems  All other systems reviewed  and are negative.   Physical Exam Updated Vital Signs BP (!) 194/87   Pulse 89   Temp 98.3 F (36.8 C) (Oral)   Resp 20   SpO2 99%   Physical Exam Vitals and nursing note reviewed.  Constitutional:      General: She is not in acute distress.    Appearance: She is well-developed. She is obese.  HENT:     Head: Normocephalic and atraumatic.  Eyes:     Conjunctiva/sclera: Conjunctivae normal.     Pupils: Pupils are equal, round, and reactive to light.  Cardiovascular:     Rate and Rhythm: Normal rate and regular rhythm.     Heart sounds: No murmur.  Pulmonary:     Effort: Pulmonary effort is normal. No respiratory distress.     Breath sounds: Normal breath sounds. No wheezing or rales.  Abdominal:     General: There  is no distension.     Palpations: Abdomen is soft.     Tenderness: There is no abdominal tenderness. There is no guarding or rebound.  Musculoskeletal:        General: No tenderness.     Cervical back: Normal range of motion and neck supple.     Lumbar back: Bony tenderness present. No edema. Normal range of motion. Positive left straight leg raise test. No scoliosis.       Back:  Skin:    General: Skin is warm and dry.     Findings: Rash present. No erythema.     Comments: Skin lesions present over the lower extremities, arms and abdomen consistent with psoriasis  Neurological:     Mental Status: She is alert and oriented to person, place, and time.     Comments: 5 out of 5 strength in the right lower extremity, 4 out of 5 strength with quadriceps muscles in the left lower extremity but 5 out of 5 strength with plantar and dorsi flexion.  Decreased sensation to light touch over the knee and lateral lower extremity but normal sensation in the foot  Psychiatric:        Mood and Affect: Mood normal.        Behavior: Behavior normal.        Thought Content: Thought content normal.     ED Results / Procedures / Treatments   Labs (all labs ordered are listed, but only abnormal results are displayed) Labs Reviewed  CBC WITH DIFFERENTIAL/PLATELET - Abnormal; Notable for the following components:      Result Value   WBC 15.5 (*)    RBC 5.26 (*)    MCH 25.7 (*)    RDW 15.8 (*)    Neutro Abs 13.7 (*)    Abs Immature Granulocytes 0.12 (*)    All other components within normal limits  BASIC METABOLIC PANEL - Abnormal; Notable for the following components:   Glucose, Bld 181 (*)    All other components within normal limits    EKG None  Radiology MR LUMBAR SPINE WO CONTRAST  Result Date: 04/06/2019 CLINICAL DATA:  Low back pain. Left leg weakness and numbness for 1 year with a fall today. Prior lumbar surgery. EXAM: MRI LUMBAR SPINE WITHOUT CONTRAST TECHNIQUE: Multiplanar,  multisequence MR imaging of the lumbar spine was performed. No intravenous contrast was administered. COMPARISON:  01/10/2010 FINDINGS: Segmentation: For consistency with the previous MRI report, the lowest fully formed disc space is designated L5-S1. There are bilateral ribs at L1. Alignment:  Normal. Vertebrae: No acute fracture, suspicious  marrow lesion, or evidence of discitis. Minimal chronic anterior wedging of the T12 vertebral body. Conus medullaris and cauda equina: Conus extends to the L1-2 level. Conus and cauda equina appear normal. Paraspinal and other soft tissues: Postoperative changes in the posterior lower lumbar soft tissues. No fluid collection. Disc levels: Disc desiccation at T12-L1 and from L3-4 to L5-S1 with associated mild disc space narrowing. L1-2: Increased, mild right and moderate left facet hypertrophy without disc herniation or stenosis. L2-3: Mild left greater than right facet hypertrophy without disc herniation or stenosis, unchanged. 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 Patent neural foramina. L4-5: Mild disc bulging, mild facet and ligamentum flavum hypertrophy, and congenitally short pedicles result in new borderline to mild bilateral lateral recess stenosis without spinal or neural foraminal stenosis. L5-S1: Interval right laminectomy with epidural fibrosis surrounding the right S1 nerve root in the lateral recess. Disc bulging, endplate spurring, and mild facet hypertrophy result in mild-to-moderate right and moderate left neural foraminal stenosis and borderline to mild bilateral lateral recess stenosis without spinal stenosis. Spinal canal and lateral recess patency is improved following  microdiskectomy. IMPRESSION: 1. New disc extrusion at L3-4 with left L4 nerve root impingement in the lateral recess. 2. Interval right laminectomy and microdiscectomy at L5-S1 with improved lateral recess patency. Mild-to-moderate right and moderate left neural foraminal stenosis due to disc bulging and spurring. 3. New borderline to mild lateral recess stenosis at L4-5. Electronically Signed   By: Logan Bores M.D.   On: 04/06/2019 14:17    Procedures Procedures (including critical care time)  Medications Ordered in ED Medications - No data to display  ED Course  I have reviewed the triage vital signs and the nursing notes.  Pertinent labs & imaging results that were available during my care of the patient were reviewed by me and considered in my medical decision making (see chart for details).    MDM Rules/Calculators/A&P                      43 year old female presenting today with left lower extremity weakness and numbness that started today.  She has been having back pain and upper thigh burning for the last 3 days most consistent with lumbar radiculopathy.  Prior history of back surgery and disc issues on the right.  No known trauma prior to today when her knee gave out and she fell to the ground and was unable to get up.  She has 4 out of 5 strength in the left lower extremity and decreased sensation over the knee.  She does have good foot strength and no evidence of foot drop.  She denies any incontinence or symptoms of cauda equina.  Low suspicion for discitis or osteomyelitis or epidural hematoma.  Patient had just started taking prednisone yesterday. We will do MRI to further evaluate patient is having some mild pain but her biggest complaint is now weakness and numbness of the leg and inability to walk.  Normally she walks without assistance but was using a walker today and her leg still gave out.  2:55 PM CBC with mild leukocytosis of 15,000 patient did just start on prednisone  yesterday, MRI shows a new disc extrusion at L3-4 with left L4 nerve root impingement.  New borderline mild lateral recess stenosis at L4 and 5.  Spoke with Dr. Wells Guiles office the PA [place] and they recommended continuing prednisone, meloxicam and they will call her on Monday for follow-up.  Patient does have a walker at home but will also get a wheelchair delivered to her home to ensure she is safe at home.  Findings discussed with the patient and she is comfortable with this plan.      Durable Medical Equipment  (From admission, onward)         Start     Ordered   04/06/19 1516  For home use only DME Bedside commode  Once    Question:  Patient needs a bedside commode to treat with the following condition  Answer:  Left leg weakness   04/06/19 1515   04/06/19 1454  For home use only DME wheelchair cushion (seat and back)  Once    Comments: Will need bariatric wheelchair   04/06/19 1454           Final Clinical Impression(s) / ED Diagnoses Final diagnoses:  Lumbosacral radiculopathy at L4    Rx / DC Orders ED Discharge Orders    None       Gwyneth Sprout, MD 04/06/19 1513    Gwyneth Sprout, MD 04/06/19 1515

## 2019-04-07 ENCOUNTER — Telehealth (HOSPITAL_COMMUNITY): Payer: Self-pay | Admitting: Emergency Medicine

## 2019-04-07 ENCOUNTER — Ambulatory Visit (HOSPITAL_COMMUNITY): Admission: RE | Admit: 2019-04-07 | Payer: 59 | Source: Ambulatory Visit

## 2019-04-07 NOTE — Telephone Encounter (Signed)
Ordered RN, PT, aide for pt at home.

## 2019-04-07 NOTE — ED Notes (Signed)
Pt is requesting to speak to PA, PA had already left and placed pt up for d/c. Pt states that she would like to stay due to not being able to walk. Writer of this note spoke with Dr. Wilson Singer and Dr. Randal Buba regarding pt wanting to stay. It was recommenced Social Work order placed to follow up with pt regarding mobility assistance, transportation assistance and home health assistance. Pt and sister agreed that was a good plan. Pt provided with briefs, pads and no rinse cleanser to take home with her.

## 2019-04-07 NOTE — Progress Notes (Signed)
04/07/2019 115 pm TOC CM received call from pt wanting to see when [facility] will come out. Reviewed notes and no HH ordered. Message sent EDP for [facility] orders. States her DME is scheduled for deliver on Monday. CM contacted [place] to see if they can deliver this weekend. Pt states she is having difficult time getting up to bathroom and getting around. [place], [place] ED TOC CM [phone]

## 2019-04-09 ENCOUNTER — Telehealth: Payer: Self-pay | Admitting: *Deleted

## 2019-04-09 NOTE — Telephone Encounter (Signed)
Patient suffers from left radiculopathy with weakness of the left leg which impairs their ability to perform daily activities like dressing in the home. A walker will not resolve issue with performing activities of daily living. A wheelchair will allow patient to safely perform daily activities. Patient can safely propel the wheelchair in the home or has a caregiver who can provide assistance. Length of need 6 months .  Accessories: elevating leg rests (ELRs), wheel locks, extensions and anti-tippers.

## 2019-04-11 ENCOUNTER — Telehealth: Payer: Self-pay | Admitting: *Deleted

## 2019-04-11 NOTE — Telephone Encounter (Signed)
TOC CM contacted pt to follow up on [facility], explained agency declined referral. Pt states she is getting stronger and feels she no longer needs HH. States she is taking a sponge bath and walking with her walker.  Has a friend that comes to assist her as needed. Jonnie Finner RN CCM, [facility] ED TOC CM [phone]  [facility]-declined [facility]-declined [facility]-declined [facility]-does not accept [facility]-declined [facility]-does not accept

## 2020-01-23 ENCOUNTER — Other Ambulatory Visit: Payer: Self-pay | Admitting: Physician Assistant

## 2020-10-11 ENCOUNTER — Emergency Department (HOSPITAL_BASED_OUTPATIENT_CLINIC_OR_DEPARTMENT_OTHER)
Admission: EM | Admit: 2020-10-11 | Discharge: 2020-10-11 | Disposition: A | Discharge status: Home / Self Care | Payer: BC Managed Care – PPO | Attending: Emergency Medicine | Admitting: Emergency Medicine

## 2020-10-11 ENCOUNTER — Emergency Department (HOSPITAL_BASED_OUTPATIENT_CLINIC_OR_DEPARTMENT_OTHER): Payer: BC Managed Care – PPO

## 2020-10-11 ENCOUNTER — Encounter (HOSPITAL_BASED_OUTPATIENT_CLINIC_OR_DEPARTMENT_OTHER): Payer: Self-pay

## 2020-10-11 DIAGNOSIS — T1490XA Injury, unspecified, initial encounter: Secondary | ICD-10-CM

## 2020-10-11 DIAGNOSIS — I10 Essential (primary) hypertension: Secondary | ICD-10-CM | POA: Diagnosis not present

## 2020-10-11 DIAGNOSIS — W230XXA Caught, crushed, jammed, or pinched between moving objects, initial encounter: Secondary | ICD-10-CM | POA: Diagnosis not present

## 2020-10-11 DIAGNOSIS — S62662A Nondisplaced fracture of distal phalanx of right middle finger, initial encounter for closed fracture: Secondary | ICD-10-CM | POA: Diagnosis not present

## 2020-10-11 DIAGNOSIS — S6991XA Unspecified injury of right wrist, hand and finger(s), initial encounter: Secondary | ICD-10-CM | POA: Diagnosis present

## 2020-10-11 DIAGNOSIS — S67192A Crushing injury of right middle finger, initial encounter: Secondary | ICD-10-CM

## 2020-10-11 DIAGNOSIS — Z23 Encounter for immunization: Secondary | ICD-10-CM | POA: Insufficient documentation

## 2020-10-11 HISTORY — DX: Essential (primary) hypertension: I10

## 2020-10-11 MED ORDER — TETANUS-DIPHTH-ACELL PERTUSSIS 5-2.5-18.5 LF-MCG/0.5 IM SUSY
0.5000 mL | PREFILLED_SYRINGE | Freq: Once | INTRAMUSCULAR | Status: AC
  Administered 2020-10-11: 0.5 mL via INTRAMUSCULAR
  Filled 2020-10-11: qty 0.5

## 2020-10-11 NOTE — ED Triage Notes (Addendum)
PTA patient had right middle finger crushed by automatic window of car door. Pt c/o right middle finger pain with fingernail intact. No bleeding and slight swelling noted.   Pt did not take any meds PTA.

## 2020-10-11 NOTE — ED Provider Notes (Signed)
[facility] Provider Note: Lowella Dell, MD, FACEP  CSN: [id number] MRN: [id number] ARRIVAL: 10/11/20 at 1924 ROOM: DB014/DB014   CHIEF COMPLAINT  Finger Injury   HISTORY OF PRESENT ILLNESS  10/11/20 11:00 PM April Gonzales is a 45 y.o. female who had her hand on partially opened car window, attempting to pull the door open, when her friend inadvertently closed the window, crushing her right middle finger tip.  This occurred about 7 PM.  She has ecchymosis and pain of the distal phalanx of that finger.  She initially rated the pain as a 10 out of 10 but states the pain has significantly improved.  Sensation of the tip of the finger is slightly decreased.   Past Medical History:  Diagnosis Date   Celiac disease    Hypertension    Psoriasis     Past Surgical History:  Procedure Laterality Date   CHOLECYSTECTOMY      Family History  Problem Relation Age of Onset   Diabetes Mellitus II Mother    Hypertension Mother    CAD Father    Chronic Renal Failure Father    Hyperlipidemia Father     Social History   Tobacco Use   Smoking status: Never   Smokeless tobacco: Never  Substance Use Topics   Alcohol use: No   Drug use: No    Prior to Admission medications   Medication Sig Start Date End Date Taking? Authorizing Provider  ibuprofen (ADVIL,MOTRIN) 200 MG tablet Take 400 mg by mouth every 6 (six) hours as needed for mild pain or moderate pain.     [provider]  meloxicam (MOBIC) 15 MG tablet Take 15 mg by mouth daily. 04/05/19   [provider]  predniSONE (STERAPRED UNI-PAK 21 TAB) 5 MG (21) TBPK tablet Take 5-30 mg by mouth See admin instructions. Taper as directed 6-5-4-3-2-1 04/05/19   [provider]  STELARA 90 MG/ML SOSY injection Inject 90 mg into the muscle every 3 (three) months. 02/06/16   [provider]  triamcinolone cream (KENALOG) 0.1 % Apply 1 application topically 2 (two) times daily as needed  (psoriasis).  01/26/18   [provider]    Allergies Dilaudid [hydromorphone hcl]   REVIEW OF SYSTEMS  Negative except as noted here or in the History of Present Illness.   PHYSICAL EXAMINATION  Initial Vital Signs Blood pressure (!) 180/99, pulse (!) 106, temperature 99.2 F (37.3 C), temperature source Temporal, resp. rate 19, last menstrual period 09/17/2020, SpO2 99 %.  Examination General: Well-developed, high BMI female in no acute distress; appearance consistent with age of record HENT: normocephalic; atraumatic Eyes: Normal appearance Neck: supple Heart: regular rate and rhythm Lungs: clear to auscultation bilaterally Abdomen: soft; nondistended; bowel sounds present Extremities: No deformity; ecchymosis of pad of distal phalanx of right middle finger, small subungual hematoma at the tip of the nail of the right middle finger, sensation is slightly decreased at the tip of the right middle finger but intact on the pad, flexion and extension at the the DIP joint of the right middle finger intact but decreased range of motion, finger pad of the right middle finger soft Neurologic: Awake, alert and oriented; motor function intact in all extremities and symmetric; no facial droop Skin: Warm and dry Psychiatric: Normal mood and affect   RESULTS  Summary of this visit's results, reviewed and interpreted by myself:   EKG Interpretation  Date/Time:    Ventricular Rate:    PR Interval:  QRS Duration:   QT Interval:    QTC Calculation:   R Axis:     Text Interpretation:          Laboratory Studies: No results found for this or any previous visit (from the past 24 hour(s)). Imaging Studies: DG Hand Complete Right  Result Date: 10/11/2020 CLINICAL DATA:  Status post trauma to the right middle finger. EXAM: RIGHT HAND - COMPLETE 3+ VIEW COMPARISON:  None. FINDINGS: An acute, nondisplaced fracture deformity is seen extending through the tuft of the distal  phalanx of the third right finger. There is no evidence of dislocation. Soft tissues are unremarkable. IMPRESSION: Acute fracture of the distal phalanx of the third right finger. Electronically Signed   By: Aram Candela M.D.   On: 10/11/2020 21:35    ED COURSE and MDM  Nursing notes, initial and subsequent vitals signs, including pulse oximetry, reviewed and interpreted by myself.  Vitals:   10/11/20 1935  BP: (!) 180/99  Pulse: (!) 106  Resp: 19  Temp: 99.2 F (37.3 C)  TempSrc: Temporal  SpO2: 99%   Medications  Tdap (BOOSTRIX) injection 0.5 mL (has no administration in time range)    Radiograph shows subtle fracture of the tuft of the distal phalanx of the right middle finger.  There is ecchymosis of the finger pad but the pad is still soft without signs of compartment syndrome.  The patient was advised of the signs and symptoms of a compartment syndrome that should warrant return.  We will update the patient's tetanus and splint her finger for comfort.  PROCEDURES  Procedures   ED DIAGNOSES     ICD-10-CM   1. Crushing injury of right middle finger, initial encounter  S67.192A                2. Closed nondisplaced fracture of distal phalanx of right middle finger, initial encounter  E94.076K          Paula Libra, MD 10/11/20 2311

## 2021-04-09 ENCOUNTER — Telehealth: Payer: BC Managed Care – PPO

## 2021-08-19 ENCOUNTER — Other Ambulatory Visit: Payer: Self-pay | Admitting: Nurse Practitioner

## 2021-08-19 DIAGNOSIS — Z1231 Encounter for screening mammogram for malignant neoplasm of breast: Secondary | ICD-10-CM

## 2021-08-25 ENCOUNTER — Ambulatory Visit
Admission: RE | Admit: 2021-08-25 | Discharge: 2021-08-25 | Disposition: A | Discharge status: Home / Self Care | Payer: BC Managed Care – PPO | Source: Ambulatory Visit | Attending: Nurse Practitioner | Admitting: Nurse Practitioner

## 2021-08-25 DIAGNOSIS — Z1231 Encounter for screening mammogram for malignant neoplasm of breast: Secondary | ICD-10-CM

## 2021-11-19 ENCOUNTER — Ambulatory Visit (INDEPENDENT_AMBULATORY_CARE_PROVIDER_SITE_OTHER): Payer: BC Managed Care – PPO | Admitting: Family Medicine

## 2021-11-19 ENCOUNTER — Encounter: Payer: Self-pay | Admitting: Family Medicine

## 2021-11-19 VITALS — BP 160/82 | HR 85 | Temp 97.7°F | Ht 68.0 in | Wt 313.0 lb

## 2021-11-19 DIAGNOSIS — F419 Anxiety disorder, unspecified: Secondary | ICD-10-CM

## 2021-11-19 DIAGNOSIS — L409 Psoriasis, unspecified: Secondary | ICD-10-CM

## 2021-11-19 DIAGNOSIS — E119 Type 2 diabetes mellitus without complications: Secondary | ICD-10-CM

## 2021-11-19 DIAGNOSIS — G8929 Other chronic pain: Secondary | ICD-10-CM

## 2021-11-19 DIAGNOSIS — F32A Depression, unspecified: Secondary | ICD-10-CM

## 2021-11-19 DIAGNOSIS — K6289 Other specified diseases of anus and rectum: Secondary | ICD-10-CM

## 2021-11-19 DIAGNOSIS — R030 Elevated blood-pressure reading, without diagnosis of hypertension: Secondary | ICD-10-CM | POA: Diagnosis not present

## 2021-11-19 DIAGNOSIS — Z8719 Personal history of other diseases of the digestive system: Secondary | ICD-10-CM

## 2021-11-19 LAB — COMPREHENSIVE METABOLIC PANEL
ALT: 45 U/L — ABNORMAL HIGH (ref 0–35)
AST: 43 U/L — ABNORMAL HIGH (ref 0–37)
Albumin: 3.8 g/dL (ref 3.5–5.2)
Alkaline Phosphatase: 125 U/L — ABNORMAL HIGH (ref 39–117)
BUN: 9 mg/dL (ref 6–23)
CO2: 28 mEq/L (ref 19–32)
Calcium: 9.1 mg/dL (ref 8.4–10.5)
Chloride: 97 mEq/L (ref 96–112)
Creatinine, Ser: 0.75 mg/dL (ref 0.40–1.20)
GFR: 95.11 mL/min (ref >60.00)
Glucose, Bld: 365 mg/dL — ABNORMAL HIGH (ref 70–99)
Potassium: 4.4 mEq/L (ref 3.5–5.1)
Sodium: 133 mEq/L — ABNORMAL LOW (ref 135–145)
Total Bilirubin: 0.5 mg/dL (ref 0.2–1.2)
Total Protein: 7.9 g/dL (ref 6.0–8.3)

## 2021-11-19 LAB — TSH: TSH: 1.95 u[IU]/mL (ref 0.35–5.50)

## 2021-11-19 LAB — T4, FREE: Free T4: 1.1 ng/dL (ref 0.60–1.60)

## 2021-11-19 LAB — CBC WITH DIFFERENTIAL/PLATELET
Basophils Absolute: 0 10*3/uL (ref 0.0–0.1)
Basophils Relative: 0.4 % (ref 0.0–3.0)
Eosinophils Absolute: 0.2 10*3/uL (ref 0.0–0.7)
Eosinophils Relative: 1.5 % (ref 0.0–5.0)
HCT: 42.7 % (ref 36.0–46.0)
Hemoglobin: 13.9 g/dL (ref 12.0–15.0)
Lymphocytes Relative: 20.3 % (ref 12.0–46.0)
Lymphs Abs: 2.1 10*3/uL (ref 0.7–4.0)
MCHC: 32.5 g/dL (ref 30.0–36.0)
MCV: 82 fl (ref 78.0–100.0)
Monocytes Absolute: 0.5 10*3/uL (ref 0.1–1.0)
Monocytes Relative: 5.1 % (ref 3.0–12.0)
Neutro Abs: 7.5 10*3/uL (ref 1.4–7.7)
Neutrophils Relative %: 72.7 % (ref 43.0–77.0)
Platelets: 245 10*3/uL (ref 150.0–400.0)
RBC: 5.21 Mil/uL — ABNORMAL HIGH (ref 3.87–5.11)
RDW: 16 % — ABNORMAL HIGH (ref 11.5–15.5)
WBC: 10.4 10*3/uL (ref 4.0–10.5)

## 2021-11-19 LAB — HEMOGLOBIN A1C: Hgb A1c MFr Bld: 13.8 % — ABNORMAL HIGH (ref 4.6–6.5)

## 2021-11-19 MED ORDER — ESCITALOPRAM OXALATE 10 MG PO TABS: 10.0000 mg | ORAL_TABLET | Freq: Every day | ORAL | 1 refills | Status: DC

## 2021-11-19 MED ORDER — HYDROCORT-PRAMOXINE (PERIANAL) 1-1 % EX FOAM: 1.0000 | Freq: Two times a day (BID) | CUTANEOUS | 1 refills | Status: DC

## 2021-11-19 NOTE — Progress Notes (Signed)
New Patient Office Visit  Subjective    Patient ID: April Gonzales, female    DOB: 1975/08/25  Age: 46 y.o. MRN: [id number]  CC:  Chief Complaint  Patient presents with   Establish Care    Would like to be checked for high BP and her A1C checked. Also mentions "nodules" on her rectum that she would like looked at at irritates her when she wipes. States that it gets smaller and bigger some days.   Mentions she would like to discuss weight loss options as well.    HPI April Gonzales presents to establish care.  She has several concerns.   Diabetes - Hgb A1c 6.6% in 2017 she has been taking metformin off-and-on.  We will check labs to see how her diabetes is doing.  HTN-states she has never been treated but has had elevated blood pressures.  She does not check her blood pressure at home.  Family history of HTN   Hx of pancreatitis 2-3 times per patient   Depression and anxiety- increased stress at work.  She has a therapist.  Interested in starting on medication.  She has never taken medication in the past. She becomes tearful talking about it.  States she feels bad about her weight and appearance.  No family history of colon cancer.   Hemorrhoids for the past 1 1/2 years.  Painful.  Bright red blood months ago but none recently.  States she has not seen anyone for this.  Psoriasis -being treated by dermatology Specialists   Lives alone with 2 cats.  Works at [employer].  On STD for back pain and leg pain.   Sees [facility].   Denies fever, chills, dizziness, chest pain, palpitations, shortness of breath, abdominal pain, N/V/D, urinary symptoms.      11/19/2021    1:33 PM  Depression screen PHQ 2/9  Decreased Interest 1  Down, Depressed, Hopeless 1  PHQ - 2 Score 2  Altered sleeping 2  Tired, decreased energy 2  Change in appetite 1  Feeling bad or failure about yourself  2  Trouble concentrating 2  Moving slowly or fidgety/restless 1   Suicidal thoughts 0  PHQ-9 Score 12      Outpatient Encounter Medications as of 11/19/2021  Medication Sig   escitalopram (LEXAPRO) 10 MG tablet Take 1 tablet (10 mg total) by mouth daily.   hydrocortisone-pramoxine (PROCTOFOAM-HC) rectal foam Place 1 applicator rectally 2 (two) times daily.   meloxicam (MOBIC) 15 MG tablet Take 15 mg by mouth daily.   metFORMIN (GLUCOPHAGE) 500 MG tablet Take 500 mg by mouth 2 (two) times daily.   STELARA 90 MG/ML SOSY injection Inject 90 mg into the muscle every 3 (three) months.   triamcinolone cream (KENALOG) 0.1 % Apply 1 application topically 2 (two) times daily as needed (psoriasis).    [DISCONTINUED] ibuprofen (ADVIL,MOTRIN) 200 MG tablet Take 400 mg by mouth every 6 (six) hours as needed for mild pain or moderate pain.    [DISCONTINUED] predniSONE (STERAPRED UNI-PAK 21 TAB) 5 MG (21) TBPK tablet Take 5-30 mg by mouth See admin instructions. Taper as directed 6-5-4-3-2-1   No facility-administered encounter medications on file as of 11/19/2021.    Past Medical History:  Diagnosis Date   Celiac disease    Hypertension    Psoriasis    Type 2 diabetes mellitus without complication, without long-term current use of insulin (HCC) 11/19/2021    Past Surgical History:  Procedure  Laterality Date   CHOLECYSTECTOMY      Family History  Problem Relation Age of Onset   Diabetes Mellitus II Mother    Hypertension Mother    CAD Father    Chronic Renal Failure Father    Hyperlipidemia Father     Social History   Socioeconomic History   Marital status: Single    Spouse name: Not on file   Number of children: Not on file   Years of education: Not on file   Highest education level: Not on file  Occupational History   Not on file  Tobacco Use   Smoking status: Never   Smokeless tobacco: Never  Substance and Sexual Activity   Alcohol use: No   Drug use: No   Sexual activity: Not on file  Other Topics Concern   Not on file  Social History  Narrative   Not on file   Social Determinants of Health   Financial Resource Strain: Not on file  Food Insecurity: Not on file  Transportation Needs: Not on file  Physical Activity: Not on file  Stress: Not on file  Social Connections: Not on file  Intimate Partner Violence: Not on file    ROS Pertinent positives and negatives in the history of present illness.      Objective    BP (!) 160/82 (BP Location: Left Arm, Patient Position: Sitting, Cuff Size: Large)   Pulse 85   Temp 97.7 F (36.5 C) (Temporal)   Ht 5\' 8"  (1.727 m)   Wt (!) 313 lb (142 kg)   SpO2 97%   BMI 47.59 kg/m   Physical Exam Exam conducted with a chaperone present.  Constitutional:      General: She is not in acute distress.    Appearance: She is not ill-appearing.  Cardiovascular:     Rate and Rhythm: Normal rate.  Pulmonary:     Effort: Pulmonary effort is normal.  Genitourinary:    Rectum: Tenderness and external hemorrhoid present.  Skin:    General: Skin is warm and dry.  Neurological:     Mental Status: She is alert.  Psychiatric:        Mood and Affect: Affect normal. Mood is depressed.        Speech: Speech normal.        Behavior: Behavior normal.        Thought Content: Thought content normal. Thought content does not include homicidal or suicidal ideation.         Assessment & Plan:   Problem List Items Addressed This Visit       Endocrine   Type 2 diabetes mellitus without complication, without long-term current use of insulin (HCC) - Primary    History of fairly well-controlled diabetes.  She has been on metformin.  Check labs and follow-up.      Relevant Medications   metFORMIN (GLUCOPHAGE) 500 MG tablet   Other Relevant Orders   CBC with Differential/Platelet (Completed)   Comprehensive metabolic panel (Completed)   TSH (Completed)   T4, free (Completed)   Hemoglobin A1c (Completed)   Amb Ref to Medical Weight Management     Musculoskeletal and Integument    Psoriasis    Under the care of dermatology specialists        Other   Anxiety and depression    She does not appear to be in any danger, no thoughts of self-harm.  She is amenable to starting on treatment.  Lexapro prescribed.  Discussed how  to take medication and potential side effects.  She will follow-up immediately if she is worsening.  Otherwise she will follow-up in 4 weeks.  Continue seeing therapist.  She may also call and schedule with a psychiatrist as she has interest of doing this.      Relevant Medications   escitalopram (LEXAPRO) 10 MG tablet   Other Relevant Orders   Amb Ref to Medical Weight Management   Chronic rectal pain    Discussed how to keep stools soft and avoid straining to have bowel movements.  I will treat her with Anusol HC and if she is not improving, consider referral to GI or general surgery.      Relevant Medications   escitalopram (LEXAPRO) 10 MG tablet   hydrocortisone-pramoxine (PROCTOFOAM-HC) rectal foam   Elevated BP without diagnosis of hypertension    Discussed that she will most likely need medication to help lower blood pressure.  Discussed that weight loss will most likely help.  Encouraged her to buy a blood pressure cuff and start checking her blood pressure at home.  She will follow-up in 4 weeks and is aware that we will most likely need to start her on medication at that time.  DASH diet handout provided.  Discussed low-sodium diet.      Relevant Orders   CBC with Differential/Platelet (Completed)   Comprehensive metabolic panel (Completed)   History of pancreatitis   Morbid obesity (HCC)    She is motivated to lose weight.  Discussed that if she would not be a candidate for some medications due to hypertension and history of pancreatitis.  Referral to [facility] management.      Relevant Medications   metFORMIN (GLUCOPHAGE) 500 MG tablet   Other Relevant Orders   TSH (Completed)   T4, free (Completed)   Amb Ref to  Medical Weight Management    Return in about 4 weeks (around 12/17/2021) for multiple issues.   Hetty Blend, NP-C

## 2021-11-19 NOTE — Progress Notes (Signed)
Please call and schedule her to return for uncontrolled diabetes next week. She should start taking metformin 1,000 mg twice daily with meals (please see if she still has metformin at home and we can send it in if not).

## 2021-11-19 NOTE — Patient Instructions (Addendum)
Start on the Lexapro 1/2 tablet daily  for the first 7 days and then if you are doing well, you may increase to the full tablet daily. Follow up with me in the next 4 weeks or sooner if needed.   Keep your stools soft with stool softeners, fiber and Miralax if needed. These are over the counter.      You can call to schedule your appointment with the psychiatrist. A few offices are listed below for you to call.     [facility]  [address], [place], [place] [address]  Phone: [phone]   [facility] [address] [place], [place] [address]  Phone: [phone]   [facility]  Ask for a psychiatrist  [address]  (across from [facility])  [phone]

## 2021-11-20 ENCOUNTER — Telehealth: Payer: Self-pay | Admitting: Family Medicine

## 2021-11-20 DIAGNOSIS — Z8719 Personal history of other diseases of the digestive system: Secondary | ICD-10-CM | POA: Insufficient documentation

## 2021-11-20 DIAGNOSIS — G8929 Other chronic pain: Secondary | ICD-10-CM | POA: Insufficient documentation

## 2021-11-20 NOTE — Assessment & Plan Note (Signed)
She is motivated to lose weight.  Discussed that if she would not be a candidate for some medications due to hypertension and history of pancreatitis.  Referral to [facility] health weight management.

## 2021-11-20 NOTE — Telephone Encounter (Signed)
Pt missed call to discuss lab results. Pt is requesting a callback.

## 2021-11-20 NOTE — Assessment & Plan Note (Signed)
Discussed how to keep stools soft and avoid straining to have bowel movements.  I will treat her with Anusol HC and if she is not improving, consider referral to GI or general surgery.

## 2021-11-20 NOTE — Assessment & Plan Note (Signed)
Under the care of dermatology specialists

## 2021-11-20 NOTE — Assessment & Plan Note (Signed)
She does not appear to be in any danger, no thoughts of self-harm.  She is amenable to starting on treatment.  Lexapro prescribed.  Discussed how to take medication and potential side effects.  She will follow-up immediately if she is worsening.  Otherwise she will follow-up in 4 weeks.  Continue seeing therapist.  She may also call and schedule with a psychiatrist as she has interest of doing this.

## 2021-11-20 NOTE — Assessment & Plan Note (Signed)
History of fairly well-controlled diabetes.  She has been on metformin.  Check labs and follow-up.

## 2021-11-20 NOTE — Assessment & Plan Note (Signed)
Discussed that she will most likely need medication to help lower blood pressure.  Discussed that weight loss will most likely help.  Encouraged her to buy a blood pressure cuff and start checking her blood pressure at home.  She will follow-up in 4 weeks and is aware that we will most likely need to start her on medication at that time.  DASH diet handout provided.  Discussed low-sodium diet.

## 2021-11-23 ENCOUNTER — Other Ambulatory Visit: Payer: Self-pay

## 2021-11-23 NOTE — Telephone Encounter (Signed)
Returned pt call and went over results

## 2021-11-24 MED ORDER — METFORMIN HCL 1000 MG PO TABS: 1000.0000 mg | ORAL_TABLET | Freq: Two times a day (BID) | ORAL | 0 refills | Status: DC

## 2021-11-24 MED ORDER — METFORMIN HCL 1000 MG PO TABS: 1000.0000 mg | ORAL_TABLET | Freq: Every day | ORAL | 0 refills | Status: DC

## 2021-11-24 NOTE — Telephone Encounter (Signed)
Discontinued old Rx and sent in new Rx to reflect 1,000 mg of Metformin BID

## 2021-11-24 NOTE — Addendum Note (Signed)
Addended by: Marinus Maw on: 11/24/2021 09:35 AM   Modules accepted: Orders

## 2021-11-26 ENCOUNTER — Ambulatory Visit (INDEPENDENT_AMBULATORY_CARE_PROVIDER_SITE_OTHER): Payer: BC Managed Care – PPO | Admitting: Family Medicine

## 2021-11-26 ENCOUNTER — Encounter: Payer: Self-pay | Admitting: Family Medicine

## 2021-11-26 VITALS — BP 146/86 | HR 95 | Temp 97.9°F | Ht 68.0 in | Wt 314.0 lb

## 2021-11-26 DIAGNOSIS — E1165 Type 2 diabetes mellitus with hyperglycemia: Secondary | ICD-10-CM

## 2021-11-26 DIAGNOSIS — R030 Elevated blood-pressure reading, without diagnosis of hypertension: Secondary | ICD-10-CM | POA: Diagnosis not present

## 2021-11-26 DIAGNOSIS — F419 Anxiety disorder, unspecified: Secondary | ICD-10-CM

## 2021-11-26 DIAGNOSIS — F32A Depression, unspecified: Secondary | ICD-10-CM | POA: Diagnosis not present

## 2021-11-26 MED ORDER — DAPAGLIFLOZIN PROPANEDIOL 5 MG PO TABS: 5.0000 mg | ORAL_TABLET | Freq: Every day | ORAL | 0 refills | Status: DC

## 2021-11-26 MED ORDER — LISINOPRIL 10 MG PO TABS: 10.0000 mg | ORAL_TABLET | Freq: Every day | ORAL | 1 refills | Status: DC

## 2021-11-26 NOTE — Progress Notes (Signed)
Subjective:     Patient ID: April Gonzales, female    DOB: Nov 13, 1975, 46 y.o.   MRN: [id number]  Chief Complaint  Patient presents with   Diabetes    F/u for uncontrolled diabetes. States blood sugars this morning was 220 before a meal. Has been cutting back on starches and only drinking water.     HPI Patient is in today for follow up on uncontrolled diabetes.   Blood sugars improving at home. FBS this morning 220, down from over 400.   Has not start on Lexapro yet for depression.   Denies fever, chills, dizziness, chest pain, palpitations, shortness of breath, abdominal pain, N/V/D, urinary symptoms.   Health Maintenance Due  Topic Date Due   FOOT EXAM  Never done   OPHTHALMOLOGY EXAM  Never done   HIV Screening  Never done   Hepatitis C Screening  Never done   PAP SMEAR-Modifier  Never done   COLONOSCOPY (Pts 45-27yrs Insurance coverage will need to be confirmed)  Never done    Past Medical History:  Diagnosis Date   Celiac disease    Hypertension    Psoriasis    Type 2 diabetes mellitus without complication, without long-term current use of insulin (HCC) 11/19/2021    Past Surgical History:  Procedure Laterality Date   CHOLECYSTECTOMY      Family History  Problem Relation Age of Onset   Diabetes Mellitus II Mother    Hypertension Mother    CAD Father    Chronic Renal Failure Father    Hyperlipidemia Father     Social History   Socioeconomic History   Marital status: Single    Spouse name: Not on file   Number of children: Not on file   Years of education: Not on file   Highest education level: Not on file  Occupational History   Not on file  Tobacco Use   Smoking status: Never   Smokeless tobacco: Never  Substance and Sexual Activity   Alcohol use: No   Drug use: No   Sexual activity: Not on file  Other Topics Concern   Not on file  Social History Narrative   Not on file   Social Determinants of Health   Financial Resource Strain: Not  on file  Food Insecurity: Not on file  Transportation Needs: Not on file  Physical Activity: Not on file  Stress: Not on file  Social Connections: Not on file  Intimate Partner Violence: Not on file    Outpatient Medications Prior to Visit  Medication Sig Dispense Refill   escitalopram (LEXAPRO) 10 MG tablet Take 1 tablet (10 mg total) by mouth daily. 30 tablet 1   hydrocortisone-pramoxine (PROCTOFOAM-HC) rectal foam Place 1 applicator rectally 2 (two) times daily. 10 g 1   meloxicam (MOBIC) 15 MG tablet Take 15 mg by mouth daily.     metFORMIN (GLUCOPHAGE) 1000 MG tablet Take 1 tablet (1,000 mg total) by mouth 2 (two) times daily with a meal. 180 tablet 0   STELARA 90 MG/ML SOSY injection Inject 90 mg into the muscle every 3 (three) months.     triamcinolone cream (KENALOG) 0.1 % Apply 1 application topically 2 (two) times daily as needed (psoriasis).      No facility-administered medications prior to visit.    Allergies  Allergen Reactions   Dilaudid [Hydromorphone Hcl] Nausea And Vomiting   Hydromorphone     Other reaction(s): Unknown   Methotrexate Other (See Comments)    ROS  Objective:    Physical Exam Constitutional:      General: She is not in acute distress.    Appearance: She is not ill-appearing.  Cardiovascular:     Rate and Rhythm: Normal rate.  Pulmonary:     Effort: Pulmonary effort is normal.  Neurological:     General: No focal deficit present.     Mental Status: She is alert and oriented to person, place, and time.  Psychiatric:        Mood and Affect: Mood normal.        Behavior: Behavior normal.        Thought Content: Thought content normal.     BP (!) 146/86 (BP Location: Left Arm, Patient Position: Sitting, Cuff Size: Large)   Pulse 95   Temp 97.9 F (36.6 C) (Temporal)   Ht 5\' 8"  (1.727 m)   Wt (!) 314 lb (142.4 kg)   SpO2 97%   BMI 47.74 kg/m  Wt Readings from Last 3 Encounters:  11/26/21 (!) 314 lb (142.4 kg)  11/19/21 (!)  313 lb (142 kg)  04/06/19 (!) 375 lb (170.1 kg)       Assessment & Plan:   Problem List Items Addressed This Visit       Endocrine   Uncontrolled type 2 diabetes mellitus with hyperglycemia (HCC) - Primary    BS improving. Continue metformin 1,000 mg bid and add Farxiga 5 mg. Continue monitoring BS at home. Started on lisinopril today.  F/u in 4 wks. Consider starting statin at that time.       Relevant Medications   lisinopril (ZESTRIL) 10 MG tablet   dapagliflozin propanediol (FARXIGA) 5 MG TABS tablet     Other   Anxiety and depression    She has not started Lexapro but plans to start today. F/u in 4 wks or sooner if needed.       Elevated BP without diagnosis of hypertension    Start lisinopril 10 mg daily. Check BP at home. F/u in 4 wks        I am having 04/08/19 A. Castrillon start on lisinopril and dapagliflozin propanediol. I am also having her maintain her Stelara, meloxicam, triamcinolone cream, escitalopram, hydrocortisone-pramoxine, and metFORMIN.  Meds ordered this encounter  Medications   lisinopril (ZESTRIL) 10 MG tablet    Sig: Take 1 tablet (10 mg total) by mouth daily.    Dispense:  90 tablet    Refill:  1    Order Specific Question:   Supervising Provider    Answer:   Raynelle Fanning A [[id number]]   dapagliflozin propanediol (FARXIGA) 5 MG TABS tablet    Sig: Take 1 tablet (5 mg total) by mouth daily before breakfast.    Dispense:  30 tablet    Refill:  0    Please use coupon for 30 day free prescription    Order Specific Question:   Supervising Provider    Answer:   Hillard Danker A [[id number]]

## 2021-11-26 NOTE — Patient Instructions (Signed)
Good job with your diet, please continue limiting carbohydrates and sugar.  Allow yourself 45-60 carbohydrates for meals and no more than 15 for a snack.  Continue metformin 1000 mg twice daily with meals.  I am adding Farxiga 5 mg once daily with breakfast  I also started you on lisinopril 10 mg for blood pressure and kidney protection.    Follow-up in 4 weeks, fasting, and we will check labs including your cholesterol.

## 2021-11-29 DIAGNOSIS — E1165 Type 2 diabetes mellitus with hyperglycemia: Secondary | ICD-10-CM | POA: Insufficient documentation

## 2021-11-29 NOTE — Assessment & Plan Note (Signed)
BS improving. Continue metformin 1,000 mg bid and add Farxiga 5 mg. Continue monitoring BS at home. Started on lisinopril today.  F/u in 4 wks. Consider starting statin at that time.

## 2021-11-29 NOTE — Assessment & Plan Note (Signed)
She has not started Lexapro but plans to start today. F/u in 4 wks or sooner if needed.

## 2021-11-29 NOTE — Assessment & Plan Note (Signed)
Start lisinopril 10 mg daily. Check BP at home. F/u in 4 wks

## 2021-12-23 ENCOUNTER — Other Ambulatory Visit: Payer: Self-pay | Admitting: Orthopaedic Surgery

## 2021-12-23 DIAGNOSIS — M5106 Intervertebral disc disorders with myelopathy, lumbar region: Secondary | ICD-10-CM

## 2021-12-24 ENCOUNTER — Ambulatory Visit (INDEPENDENT_AMBULATORY_CARE_PROVIDER_SITE_OTHER): Payer: BC Managed Care – PPO | Admitting: Family Medicine

## 2021-12-24 ENCOUNTER — Other Ambulatory Visit: Payer: Self-pay | Admitting: Family Medicine

## 2021-12-24 ENCOUNTER — Encounter: Payer: Self-pay | Admitting: Family Medicine

## 2021-12-24 VITALS — BP 138/86 | HR 80 | Temp 97.5°F | Ht 68.0 in | Wt 309.0 lb

## 2021-12-24 DIAGNOSIS — R7989 Other specified abnormal findings of blood chemistry: Secondary | ICD-10-CM | POA: Diagnosis not present

## 2021-12-24 DIAGNOSIS — E1165 Type 2 diabetes mellitus with hyperglycemia: Secondary | ICD-10-CM

## 2021-12-24 DIAGNOSIS — E1159 Type 2 diabetes mellitus with other circulatory complications: Secondary | ICD-10-CM

## 2021-12-24 DIAGNOSIS — Z23 Encounter for immunization: Secondary | ICD-10-CM

## 2021-12-24 DIAGNOSIS — F419 Anxiety disorder, unspecified: Secondary | ICD-10-CM

## 2021-12-24 DIAGNOSIS — Z8719 Personal history of other diseases of the digestive system: Secondary | ICD-10-CM | POA: Diagnosis not present

## 2021-12-24 DIAGNOSIS — I152 Hypertension secondary to endocrine disorders: Secondary | ICD-10-CM

## 2021-12-24 DIAGNOSIS — F32A Depression, unspecified: Secondary | ICD-10-CM

## 2021-12-24 LAB — LIPID PANEL
Cholesterol: 105 mg/dL (ref 0–200)
HDL: 38.6 mg/dL — ABNORMAL LOW (ref >39.00)
LDL Cholesterol: 42 mg/dL (ref 0–99)
NonHDL: 66.02
Total CHOL/HDL Ratio: 3
Triglycerides: 119 mg/dL (ref 0.0–149.0)
VLDL: 23.8 mg/dL (ref 0.0–40.0)

## 2021-12-24 LAB — CBC WITH DIFFERENTIAL/PLATELET
Basophils Absolute: 0 10*3/uL (ref 0.0–0.1)
Basophils Relative: 0.5 % (ref 0.0–3.0)
Eosinophils Absolute: 0.2 10*3/uL (ref 0.0–0.7)
Eosinophils Relative: 1.7 % (ref 0.0–5.0)
HCT: 42.7 % (ref 36.0–46.0)
Hemoglobin: 13.9 g/dL (ref 12.0–15.0)
Lymphocytes Relative: 23 % (ref 12.0–46.0)
Lymphs Abs: 2.2 10*3/uL (ref 0.7–4.0)
MCHC: 32.5 g/dL (ref 30.0–36.0)
MCV: 81.7 fl (ref 78.0–100.0)
Monocytes Absolute: 0.5 10*3/uL (ref 0.1–1.0)
Monocytes Relative: 5.1 % (ref 3.0–12.0)
Neutro Abs: 6.6 10*3/uL (ref 1.4–7.7)
Neutrophils Relative %: 69.7 % (ref 43.0–77.0)
Platelets: 271 10*3/uL (ref 150.0–400.0)
RBC: 5.22 Mil/uL — ABNORMAL HIGH (ref 3.87–5.11)
RDW: 15.9 % — ABNORMAL HIGH (ref 11.5–15.5)
WBC: 9.4 10*3/uL (ref 4.0–10.5)

## 2021-12-24 LAB — COMPREHENSIVE METABOLIC PANEL
ALT: 39 U/L — ABNORMAL HIGH (ref 0–35)
AST: 33 U/L (ref 0–37)
Albumin: 3.6 g/dL (ref 3.5–5.2)
Alkaline Phosphatase: 101 U/L (ref 39–117)
BUN: 15 mg/dL (ref 6–23)
CO2: 28 mEq/L (ref 19–32)
Calcium: 9.3 mg/dL (ref 8.4–10.5)
Chloride: 98 mEq/L (ref 96–112)
Creatinine, Ser: 0.76 mg/dL (ref 0.40–1.20)
GFR: 93.55 mL/min (ref >60.00)
Glucose, Bld: 248 mg/dL — ABNORMAL HIGH (ref 70–99)
Potassium: 4.6 mEq/L (ref 3.5–5.1)
Sodium: 135 mEq/L (ref 135–145)
Total Bilirubin: 0.7 mg/dL (ref 0.2–1.2)
Total Protein: 7.8 g/dL (ref 6.0–8.3)

## 2021-12-24 MED ORDER — OZEMPIC (0.25 OR 0.5 MG/DOSE) 2 MG/3ML ~~LOC~~ SOPN: 0.2500 mg | PEN_INJECTOR | SUBCUTANEOUS | 0 refills | Status: DC

## 2021-12-24 MED ORDER — DAPAGLIFLOZIN PROPANEDIOL 10 MG PO TABS: 10.0000 mg | ORAL_TABLET | Freq: Every day | ORAL | 1 refills | Status: DC

## 2021-12-24 MED ORDER — LISINOPRIL 20 MG PO TABS: 20.0000 mg | ORAL_TABLET | Freq: Every day | ORAL | 1 refills | Status: DC

## 2021-12-24 NOTE — Assessment & Plan Note (Signed)
She is taking Lexapro 10 mg daily.  She has noticed improvement in her mood.  She is also looking forward to a job interview tomorrow and is excited about this opportunity.  She is seeing a therapist with [facility].  Is not interested in seeing a psychiatrist at this point or increasing her Lexapro dose.  She will follow-up with me as needed.

## 2021-12-24 NOTE — Assessment & Plan Note (Signed)
Fasting blood sugars are still in the 200 range.  Continue metformin 1000 mg twice daily with meals and increase Farxiga to 10 mg once daily.  Once I have her lab work from today, I will send in Norc[place] or Ozempic.  We discussed how to take the medication and possible side effects.  She does have a history of pancreatitis and is aware that we need to be cautious with these medications.  No known history of thyroid cancer. She is eager to continue with weight loss.  She lost 5 pounds since her previous visit.  Continue with limiting carbohydrates and sugar.  She will follow-up in November or sooner if needed. If neither Mounjaro or Ozempic is affordable or available for her, I will send in basal insulin and we discussed this as well.

## 2021-12-24 NOTE — Progress Notes (Signed)
Subjective:     Patient ID: April Gonzales, female    DOB: 16-Aug-1975, 46 y.o.   MRN: [id number]  Chief Complaint  Patient presents with   Follow-up    4 week f/u, is fasting today.     HPI Patient is in today for follow up on DM, HTN, and anxiety with depression.   She is working on weight loss and has lost 5 lbs since her last visit 4 weeks ago.   FBS 200-220   Reports good compliance with metformin 1,000 mg x bid and Farxiga 5 mg daily.  States her mood has improved.  She started Lexapro 10 mg. She also has a Job interview tomorrow which would allow her to work from home and she is excited about this. [facility]-she is seeing a therapist   Scheduled a visit with [facility] Colonoscopy vs Cologuard ?  States she did a Cologuard in the past but did not get her results.  Denies fever, chills, dizziness, chest pain, palpitations, shortness of breath, abdominal pain, N/V/D, urinary symptoms, LE edema.    Health Maintenance Due  Topic Date Due   FOOT EXAM  Never done   OPHTHALMOLOGY EXAM  Never done   HIV Screening  Never done   Hepatitis C Screening  Never done   PAP SMEAR-Modifier  Never done   COLONOSCOPY (Pts 45-36yrs Insurance coverage will need to be confirmed)  Never done    Past Medical History:  Diagnosis Date   Celiac disease    Hypertension    Psoriasis    Type 2 diabetes mellitus without complication, without long-term current use of insulin (HCC) 11/19/2021    Past Surgical History:  Procedure Laterality Date   CHOLECYSTECTOMY      Family History  Problem Relation Age of Onset   Diabetes Mellitus II Mother    Hypertension Mother    CAD Father    Chronic Renal Failure Father    Hyperlipidemia Father     Social History   Socioeconomic History   Marital status: Single    Spouse name: Not on file   Number of children: Not on file   Years of education: Not on file   Highest education level: Not on file  Occupational History   Not on  file  Tobacco Use   Smoking status: Never   Smokeless tobacco: Never  Substance and Sexual Activity   Alcohol use: No   Drug use: No   Sexual activity: Not on file  Other Topics Concern   Not on file  Social History Narrative   Not on file   Social Determinants of Health   Financial Resource Strain: Not on file  Food Insecurity: Not on file  Transportation Needs: Not on file  Physical Activity: Not on file  Stress: Not on file  Social Connections: Not on file  Intimate Partner Violence: Not on file    Outpatient Medications Prior to Visit  Medication Sig Dispense Refill   escitalopram (LEXAPRO) 10 MG tablet Take 1 tablet (10 mg total) by mouth daily. 30 tablet 1   hydrocortisone-pramoxine (PROCTOFOAM-HC) rectal foam Place 1 applicator rectally 2 (two) times daily. 10 g 1   meloxicam (MOBIC) 15 MG tablet Take 15 mg by mouth daily.     metFORMIN (GLUCOPHAGE) 1000 MG tablet Take 1 tablet (1,000 mg total) by mouth 2 (two) times daily with a meal. 180 tablet 0   STELARA 90 MG/ML SOSY injection Inject 90 mg into the muscle every  3 (three) months.     triamcinolone cream (KENALOG) 0.1 % Apply 1 application topically 2 (two) times daily as needed (psoriasis).      dapagliflozin propanediol (FARXIGA) 5 MG TABS tablet Take 1 tablet (5 mg total) by mouth daily before breakfast. 30 tablet 0   lisinopril (ZESTRIL) 10 MG tablet Take 1 tablet (10 mg total) by mouth daily. 90 tablet 1   No facility-administered medications prior to visit.    Allergies  Allergen Reactions   Dilaudid [Hydromorphone Hcl] Nausea And Vomiting   Hydromorphone     Other reaction(s): Unknown   Methotrexate Other (See Comments)    ROS     Objective:    Physical Exam Constitutional:      General: She is not in acute distress.    Appearance: She is not ill-appearing.  Eyes:     Conjunctiva/sclera: Conjunctivae normal.     Pupils: Pupils are equal, round, and reactive to light.  Cardiovascular:      Rate and Rhythm: Normal rate.  Pulmonary:     Effort: Pulmonary effort is normal.  Musculoskeletal:     Right lower leg: No edema.     Left lower leg: No edema.  Neurological:     General: No focal deficit present.     Mental Status: She is alert and oriented to person, place, and time.  Psychiatric:        Mood and Affect: Mood normal.        Behavior: Behavior normal.        Thought Content: Thought content normal.     BP 138/86 (BP Location: Left Arm, Patient Position: Sitting, Cuff Size: Large)   Pulse 80   Temp (!) 97.5 F (36.4 C) (Temporal)   Ht 5\' 8"  (1.727 m)   Wt (!) 309 lb (140.2 kg)   SpO2 98%   BMI 46.98 kg/m  Wt Readings from Last 3 Encounters:  12/24/21 (!) 309 lb (140.2 kg)  11/26/21 (!) 314 lb (142.4 kg)  11/19/21 (!) 313 lb (142 kg)       Assessment & Plan:   Problem List Items Addressed This Visit       Cardiovascular and Mediastinum   Hypertension associated with diabetes (HCC)    She has been on lisinopril 10 mg for the past 4 weeks and blood pressure has responded slightly.  I am increasing her dose of lisinopril to 20 mg daily.  Continue with a low-sodium diet.  I recommend she buy a blood pressure cuff and start checking her blood pressure at home.      Relevant Medications   dapagliflozin propanediol (FARXIGA) 10 MG TABS tablet   lisinopril (ZESTRIL) 20 MG tablet   Other Relevant Orders   CBC with Differential/Platelet     Endocrine   Uncontrolled type 2 diabetes mellitus with hyperglycemia (HCC) - Primary    Fasting blood sugars are still in the 200 range.  Continue metformin 1000 mg twice daily with meals and increase Farxiga to 10 mg once daily.  Once I have her lab work from today, I will send in [place] or Ozempic.  We discussed how to take the medication and possible side effects.  She does have a history of pancreatitis and is aware that we need to be cautious with these medications.  No known history of thyroid cancer. She is eager  to continue with weight loss.  She lost 5 pounds since her previous visit.  Continue with limiting carbohydrates and sugar.  She will follow-up in November or sooner if needed. If neither Mounjaro or Ozempic is affordable or available for her, I will send in basal insulin and we discussed this as well.      Relevant Medications   dapagliflozin propanediol (FARXIGA) 10 MG TABS tablet   lisinopril (ZESTRIL) 20 MG tablet     Other   Anxiety and depression    She is taking Lexapro 10 mg daily.  She has noticed improvement in her mood.  She is also looking forward to a job interview tomorrow and is excited about this opportunity.  She is seeing a [facility] with [facility].  Is not interested in seeing a psychiatrist at this point or increasing her Lexapro dose.  She will follow-up with me as needed.      Elevated LFTs    Recheck liver function today.  Discussed getting ultrasound if her LFTs are still elevated and possibly referral to gastroenterology.      Relevant Orders   CBC with Differential/Platelet   Comprehensive metabolic panel   History of pancreatitis   Morbid obesity (HCC)    She has been referred to [facility].  She is hoping for a visit with them in the near future. She has lost 5 pounds since her previous visit 4 weeks ago by eliminating some sugar and carbohydrates.  Avoiding sugary beverages now. We discussed the possibility of starting her on Ozempic or Mounjaro depending on her labs from today.  We discussed her history of pancreatitis and the increased risk.       Relevant Medications   dapagliflozin propanediol (FARXIGA) 10 MG TABS tablet   Other Relevant Orders   Lipid panel   Other Visit Diagnoses     Need for influenza vaccination       Relevant Orders   Flu Vaccine QUAD 63mo+IM (Fluarix, Fluzone & Alfiuria Quad PF) (Completed)       I have discontinued Raynelle Fanning A. Gieselman's lisinopril and dapagliflozin propanediol. I am also having her  start on dapagliflozin propanediol and lisinopril. Additionally, I am having her maintain her Stelara, meloxicam, triamcinolone cream, escitalopram, hydrocortisone-pramoxine, and metFORMIN.  Meds ordered this encounter  Medications   dapagliflozin propanediol (FARXIGA) 10 MG TABS tablet    Sig: Take 1 tablet (10 mg total) by mouth daily before breakfast.    Dispense:  90 tablet    Refill:  1    Order Specific Question:   Supervising Provider    Answer:   Hillard Danker A [[id number]]   lisinopril (ZESTRIL) 20 MG tablet    Sig: Take 1 tablet (20 mg total) by mouth daily.    Dispense:  90 tablet    Refill:  1    Order Specific Question:   Supervising Provider    Answer:   Hillard Danker A [[id number]]

## 2021-12-24 NOTE — Assessment & Plan Note (Signed)
She has been referred to [facility].  She is hoping for a visit with them in the near future. She has lost 5 pounds since her previous visit 4 weeks ago by eliminating some sugar and carbohydrates.  Avoiding sugary beverages now. We discussed the possibility of starting her on Ozempic or Mounjaro depending on her labs from today.  We discussed her history of pancreatitis and the increased risk.

## 2021-12-24 NOTE — Assessment & Plan Note (Signed)
She has been on lisinopril 10 mg for the past 4 weeks and blood pressure has responded slightly.  I am increasing her dose of lisinopril to 20 mg daily.  Continue with a low-sodium diet.  I recommend she buy a blood pressure cuff and start checking her blood pressure at home.

## 2021-12-24 NOTE — Patient Instructions (Signed)
Keep up the good work with a healthy diet.  I increased your Farxiga from 5 mg to 10 mg.  A new prescription was sent to your pharmacy for the 10 mg tablets.  This is still once daily with breakfast. Continue metformin 1000 mg twice daily with meals.  Once I have your lab results, I will send in [facility] or Ozempic which are once weekly injections for diabetes and weight loss.  Please report back if these are affordable or not so that I know if you are able to get the medication.   I also increased lisinopril from 10 mg to 20 mg once daily.  This is to help with better blood pressure control.  Continue seeing your therapist. Good luck tomorrow with your interview  I will see you back in November or sooner if needed.

## 2021-12-24 NOTE — Assessment & Plan Note (Signed)
Recheck liver function today.  Discussed getting ultrasound if her LFTs are still elevated and possibly referral to gastroenterology.

## 2021-12-30 ENCOUNTER — Encounter: Payer: Self-pay | Admitting: Family Medicine

## 2021-12-31 ENCOUNTER — Telehealth: Payer: Self-pay

## 2021-12-31 NOTE — Telephone Encounter (Signed)
Pt sent MyChart message stating the following:   "The [facility] app shows the Ozempic is only $24.99 so yes I will go ahead and get it. Now since Friday night I have had a head cold and sore throat so not sure when to start the Ozempic. Also do I need to stop taking other meds when I start taking Ozempic?"    Please advise, was not able to route original message as it is attached to future appointment pool.

## 2021-12-31 NOTE — Telephone Encounter (Signed)
Replied to pt in future appt encounter

## 2021-12-31 NOTE — Telephone Encounter (Signed)
Sent pt mychart message with information below and asked if she would like to go ahead and schedule 4 week f/u, pt replied "Not quite yet, I'm waiting to hear if I got the new job yet and what my hours will be. I'll let you know as soon as I can"  Will let her know to update [place] and please schedule when she knows

## 2022-01-07 ENCOUNTER — Other Ambulatory Visit: Payer: Self-pay | Admitting: Orthopaedic Surgery

## 2022-01-22 ENCOUNTER — Other Ambulatory Visit: Payer: BC Managed Care – PPO

## 2022-02-24 ENCOUNTER — Ambulatory Visit: Payer: BC Managed Care – PPO | Admitting: Family Medicine

## 2022-02-26 ENCOUNTER — Ambulatory Visit (INDEPENDENT_AMBULATORY_CARE_PROVIDER_SITE_OTHER): Payer: Commercial Managed Care - HMO | Admitting: Family Medicine

## 2022-02-26 ENCOUNTER — Encounter: Payer: Self-pay | Admitting: Family Medicine

## 2022-02-26 VITALS — BP 146/80 | HR 88 | Temp 97.7°F | Ht 68.0 in | Wt 309.0 lb

## 2022-02-26 DIAGNOSIS — E1165 Type 2 diabetes mellitus with hyperglycemia: Secondary | ICD-10-CM

## 2022-02-26 DIAGNOSIS — Z8719 Personal history of other diseases of the digestive system: Secondary | ICD-10-CM | POA: Diagnosis not present

## 2022-02-26 DIAGNOSIS — I152 Hypertension secondary to endocrine disorders: Secondary | ICD-10-CM

## 2022-02-26 DIAGNOSIS — F419 Anxiety disorder, unspecified: Secondary | ICD-10-CM

## 2022-02-26 DIAGNOSIS — K900 Celiac disease: Secondary | ICD-10-CM

## 2022-02-26 DIAGNOSIS — J029 Acute pharyngitis, unspecified: Secondary | ICD-10-CM

## 2022-02-26 DIAGNOSIS — G8929 Other chronic pain: Secondary | ICD-10-CM

## 2022-02-26 DIAGNOSIS — K6289 Other specified diseases of anus and rectum: Secondary | ICD-10-CM | POA: Diagnosis not present

## 2022-02-26 DIAGNOSIS — E1159 Type 2 diabetes mellitus with other circulatory complications: Secondary | ICD-10-CM

## 2022-02-26 DIAGNOSIS — F32A Depression, unspecified: Secondary | ICD-10-CM

## 2022-02-26 DIAGNOSIS — Z1211 Encounter for screening for malignant neoplasm of colon: Secondary | ICD-10-CM

## 2022-02-26 DIAGNOSIS — K219 Gastro-esophageal reflux disease without esophagitis: Secondary | ICD-10-CM

## 2022-02-26 LAB — CBC WITH DIFFERENTIAL/PLATELET
Basophils Absolute: 0.1 10*3/uL (ref 0.0–0.1)
Basophils Relative: 0.6 % (ref 0.0–3.0)
Eosinophils Absolute: 0.2 10*3/uL (ref 0.0–0.7)
Eosinophils Relative: 1.8 % (ref 0.0–5.0)
HCT: 41.7 % (ref 36.0–46.0)
Hemoglobin: 13.3 g/dL (ref 12.0–15.0)
Lymphocytes Relative: 19.3 % (ref 12.0–46.0)
Lymphs Abs: 2.3 10*3/uL (ref 0.7–4.0)
MCHC: 32 g/dL (ref 30.0–36.0)
MCV: 80.9 fl (ref 78.0–100.0)
Monocytes Absolute: 0.6 10*3/uL (ref 0.1–1.0)
Monocytes Relative: 5.2 % (ref 3.0–12.0)
Neutro Abs: 8.8 10*3/uL — ABNORMAL HIGH (ref 1.4–7.7)
Neutrophils Relative %: 73.1 % (ref 43.0–77.0)
Platelets: 280 10*3/uL (ref 150.0–400.0)
RBC: 5.15 Mil/uL — ABNORMAL HIGH (ref 3.87–5.11)
RDW: 15.7 % — ABNORMAL HIGH (ref 11.5–15.5)
WBC: 12.1 10*3/uL — ABNORMAL HIGH (ref 4.0–10.5)

## 2022-02-26 LAB — POC COVID19 BINAXNOW: SARS Coronavirus 2 Ag: NEGATIVE

## 2022-02-26 LAB — COMPREHENSIVE METABOLIC PANEL
ALT: 32 U/L (ref 0–35)
AST: 24 U/L (ref 0–37)
Albumin: 3.6 g/dL (ref 3.5–5.2)
Alkaline Phosphatase: 118 U/L — ABNORMAL HIGH (ref 39–117)
BUN: 11 mg/dL (ref 6–23)
CO2: 29 mEq/L (ref 19–32)
Calcium: 8.8 mg/dL (ref 8.4–10.5)
Chloride: 100 mEq/L (ref 96–112)
Creatinine, Ser: 0.71 mg/dL (ref 0.40–1.20)
GFR: 101.39 mL/min (ref >60.00)
Glucose, Bld: 331 mg/dL — ABNORMAL HIGH (ref 70–99)
Potassium: 4.4 mEq/L (ref 3.5–5.1)
Sodium: 134 mEq/L — ABNORMAL LOW (ref 135–145)
Total Bilirubin: 0.4 mg/dL (ref 0.2–1.2)
Total Protein: 7.6 g/dL (ref 6.0–8.3)

## 2022-02-26 LAB — HEMOGLOBIN A1C: Hgb A1c MFr Bld: 13.1 % — ABNORMAL HIGH (ref 4.6–6.5)

## 2022-02-26 LAB — VITAMIN B12: Vitamin B-12: 210 pg/mL — ABNORMAL LOW (ref 211–911)

## 2022-02-26 LAB — MICROALBUMIN / CREATININE URINE RATIO
Creatinine,U: 98.2 mg/dL
Microalb Creat Ratio: 8 mg/g (ref 0.0–30.0)
Microalb, Ur: 7.9 mg/dL — ABNORMAL HIGH (ref 0.0–1.9)

## 2022-02-26 LAB — TSH: TSH: 2.11 u[IU]/mL (ref 0.35–5.50)

## 2022-02-26 MED ORDER — OZEMPIC (0.25 OR 0.5 MG/DOSE) 2 MG/3ML ~~LOC~~ SOPN: 0.2500 mg | PEN_INJECTOR | SUBCUTANEOUS | 0 refills | Status: DC

## 2022-02-26 MED ORDER — ESCITALOPRAM OXALATE 20 MG PO TABS: 20.0000 mg | ORAL_TABLET | Freq: Every day | ORAL | 2 refills | Status: AC

## 2022-02-26 MED ORDER — METFORMIN HCL 1000 MG PO TABS: 1000.0000 mg | ORAL_TABLET | Freq: Two times a day (BID) | ORAL | 0 refills | Status: DC

## 2022-02-26 MED ORDER — LISINOPRIL 20 MG PO TABS: 20.0000 mg | ORAL_TABLET | Freq: Every day | ORAL | 1 refills | Status: DC

## 2022-02-26 MED ORDER — DAPAGLIFLOZIN PROPANEDIOL 10 MG PO TABS: 10.0000 mg | ORAL_TABLET | Freq: Every day | ORAL | 1 refills | Status: DC

## 2022-02-26 NOTE — Progress Notes (Unsigned)
Subjective:     Patient ID: April Gonzales, female    DOB: [birth year], 46 y.o.   MRN: [id number]  Chief Complaint  Patient presents with   Diabetes    2 month f/u, insurance changed so was unable to get refill of ozempic and will need that refilled   Sore Throat    Started yesterday, is requesting abx    HPI Patient is in today for follow up on chronic health conditions.   C/o sore throat, cough and congestion x 2 days.   She took ibuprofen.   Mood is ok but would like to increase Lexapro dose.   DM- she lost insurance for a while and did not have her medications. States she needs all of her medications sent to the pharmacy again now that she has new insurance.   Blood sugars were better while taking Ozempic and other medications.   Hx of celiac disease, pancreatitis, chronic rectal pain, GERD and has not had colon cancer screening.   She is seeing dermatology for psoriasis. Changing medications since Stelara has not helped.   Denies fever, chills, dizziness, chest pain, palpitations, shortness of breath, abdominal pain, N/V/D, urinary symptoms, LE edema.    Health Maintenance Due  Topic Date Due   FOOT EXAM  Never done   OPHTHALMOLOGY EXAM  Never done   HIV Screening  Never done   Hepatitis C Screening  Never done   PAP SMEAR-Modifier  Never done   COLONOSCOPY (Pts 45-45yrs Insurance coverage will need to be confirmed)  Never done    Past Medical History:  Diagnosis Date   Celiac disease    Hypertension    Psoriasis    Type 2 diabetes mellitus without complication, without long-term current use of insulin (HCC) 11/19/2021    Past Surgical History:  Procedure Laterality Date   CHOLECYSTECTOMY      Family History  Problem Relation Age of Onset   Diabetes Mellitus II Mother    Hypertension Mother    CAD Father    Chronic Renal Failure Father    Hyperlipidemia Father     Social History   Socioeconomic History   Marital status: Single    Spouse name:  Not on file   Number of children: Not on file   Years of education: Not on file   Highest education level: Not on file  Occupational History   Not on file  Tobacco Use   Smoking status: Never   Smokeless tobacco: Never  Substance and Sexual Activity   Alcohol use: No   Drug use: No   Sexual activity: Not on file  Other Topics Concern   Not on file  Social History Narrative   Not on file   Social Determinants of Health   Financial Resource Strain: Not on file  Food Insecurity: Not on file  Transportation Needs: Not on file  Physical Activity: Not on file  Stress: Not on file  Social Connections: Not on file  Intimate Partner Violence: Not on file    Outpatient Medications Prior to Visit  Medication Sig Dispense Refill   hydrocortisone-pramoxine (PROCTOFOAM-HC) rectal foam Place 1 applicator rectally 2 (two) times daily. 10 g 1   meloxicam (MOBIC) 15 MG tablet Take 15 mg by mouth daily.     triamcinolone cream (KENALOG) 0.1 % Apply 1 application topically 2 (two) times daily as needed (psoriasis).      dapagliflozin propanediol (FARXIGA) 10 MG TABS tablet Take 1 tablet (10 mg total) by  mouth daily before breakfast. 90 tablet 1   escitalopram (LEXAPRO) 10 MG tablet Take 1 tablet (10 mg total) by mouth daily. 30 tablet 1   lisinopril (ZESTRIL) 20 MG tablet Take 1 tablet (20 mg total) by mouth daily. 90 tablet 1   metFORMIN (GLUCOPHAGE) 1000 MG tablet Take 1 tablet (1,000 mg total) by mouth 2 (two) times daily with a meal. 180 tablet 0   Semaglutide,0.25 or 0.5MG /DOS, (OZEMPIC, 0.25 OR 0.5 MG/DOSE,) 2 MG/3ML SOPN Inject 0.25 mg into the skin once a week. (Patient not taking: Reported on 02/26/2022) 3 mL 0   STELARA 90 MG/ML SOSY injection Inject 90 mg into the muscle every 3 (three) months. (Patient not taking: Reported on 02/26/2022)     No facility-administered medications prior to visit.    Allergies  Allergen Reactions   Dilaudid [Hydromorphone Hcl] Nausea And Vomiting    Hydromorphone     Other reaction(s): Unknown   Methotrexate Other (See Comments)    ROS     Objective:    Physical Exam Constitutional:      General: She is not in acute distress.    Appearance: She is not ill-appearing.  HENT:     Mouth/Throat:     Mouth: Mucous membranes are moist.     Pharynx: Oropharynx is clear.  Cardiovascular:     Rate and Rhythm: Normal rate and regular rhythm.  Pulmonary:     Effort: Pulmonary effort is normal.     Breath sounds: Normal breath sounds.  Musculoskeletal:     Cervical back: Normal range of motion and neck supple.  Lymphadenopathy:     Cervical: No cervical adenopathy.  Skin:    General: Skin is warm and dry.  Neurological:     General: No focal deficit present.     Mental Status: She is alert and oriented to person, place, and time.  Psychiatric:        Mood and Affect: Mood normal.        Behavior: Behavior normal.     BP (!) 146/80 (BP Location: Left Arm, Patient Position: Sitting, Cuff Size: Large)   Pulse 88   Temp 97.7 F (36.5 C) (Temporal)   Ht 5\' 8"  (1.727 m)   Wt (!) 309 lb (140.2 kg)   SpO2 98%   BMI 46.98 kg/m  Wt Readings from Last 3 Encounters:  02/26/22 (!) 309 lb (140.2 kg)  12/24/21 (!) 309 lb (140.2 kg)  11/26/21 (!) 314 lb (142.4 kg)       Assessment & Plan:   Problem List Items Addressed This Visit       Cardiovascular and Mediastinum   Hypertension associated with diabetes (HCC)    Not at goal. Sent new prescription for lisinopril to pharmacy and she will start back on medication. Monitor BP at home. Follow up in 4 wks      Relevant Medications   dapagliflozin propanediol (FARXIGA) 10 MG TABS tablet   lisinopril (ZESTRIL) 20 MG tablet     Digestive   Celiac disease    No recent follow up with GI. Referral to get established for this and other chronic GI symptoms as well as colon cancer screening.       Relevant Orders   Ambulatory referral to Gastroenterology   Gastroesophageal  reflux disease   Relevant Orders   Ambulatory referral to Gastroenterology     Endocrine   Uncontrolled type 2 diabetes mellitus with hyperglycemia (HCC) - Primary   Relevant Medications  dapagliflozin propanediol (FARXIGA) 10 MG TABS tablet   lisinopril (ZESTRIL) 20 MG tablet   Other Relevant Orders   CBC with Differential/Platelet (Completed)   Comprehensive metabolic panel (Completed)   TSH (Completed)   Vitamin B12 (Completed)   Hemoglobin A1c (Completed)   Microalbumin / creatinine urine ratio (Completed)     Other   Anxiety and depression    Not well controlled. Increase Lexapro dose and follow up in 4 wks.       Relevant Medications   escitalopram (LEXAPRO) 20 MG tablet   Chronic rectal pain    No recent follow up with GI. Referral to get established for this and other chronic GI symptoms as well as colon cancer screening.       Relevant Medications   escitalopram (LEXAPRO) 20 MG tablet   Other Relevant Orders   Ambulatory referral to Gastroenterology   History of pancreatitis   Relevant Orders   Ambulatory referral to Gastroenterology   Morbid obesity (HCC)    Her mood is affected by her weight as well as several underlying chronic health conditions including diabetes. Referral to [facility].       Relevant Medications   dapagliflozin propanediol (FARXIGA) 10 MG TABS tablet   Other Visit Diagnoses     Sore throat       Relevant Orders   POC COVID-19 (Completed)   Screen for colon cancer       Relevant Orders   Ambulatory referral to Gastroenterology      Negative Covid test. Less likely strep. Discussed symptomatic treatment for viral illness and follow up if not improving in the next 2-3 days.    I have discontinued Lajoy A. Hamid's Stelara, escitalopram, metFORMIN, and Ozempic (0.25 or 0.5 MG/DOSE). I am also having her start on escitalopram. Additionally, I am having her maintain her meloxicam, triamcinolone cream, hydrocortisone-pramoxine,  dapagliflozin propanediol, and lisinopril.  Meds ordered this encounter  Medications   dapagliflozin propanediol (FARXIGA) 10 MG TABS tablet    Sig: Take 1 tablet (10 mg total) by mouth daily before breakfast.    Dispense:  90 tablet    Refill:  1    Order Specific Question:   Supervising Provider    Answer:   Hillard Danker A [[id number]]   lisinopril (ZESTRIL) 20 MG tablet    Sig: Take 1 tablet (20 mg total) by mouth daily.    Dispense:  90 tablet    Refill:  1    Order Specific Question:   Supervising Provider    Answer:   Hillard Danker A [[id number]]   DISCONTD: metFORMIN (GLUCOPHAGE) 1000 MG tablet    Sig: Take 1 tablet (1,000 mg total) by mouth 2 (two) times daily with a meal.    Dispense:  180 tablet    Refill:  0    Order Specific Question:   Supervising Provider    Answer:   Hillard Danker A [[id number]]   DISCONTD: Semaglutide,0.25 or 0.5MG /DOS, (OZEMPIC, 0.25 OR 0.5 MG/DOSE,) 2 MG/3ML SOPN    Sig: Inject 0.25 mg into the skin once a week.    Dispense:  3 mL    Refill:  0    Order Specific Question:   Supervising Provider    Answer:   Hillard Danker A [[id number]]   escitalopram (LEXAPRO) 20 MG tablet    Sig: Take 1 tablet (20 mg total) by mouth daily.    Dispense:  30 tablet    Refill:  2    Order  Specific Question:   Supervising Provider    Answer:   Pricilla Holm A [id number]

## 2022-02-26 NOTE — Patient Instructions (Addendum)
For your sore throat use Tylenol or ibuprofen and salt water gargles.  You can take Mucinex for cough and congestion.  Follow up if you are getting worse or not improving in the next 3-4 days.   I have referred you to [facility] and they will call you to schedule.    Please call to schedule with  [facility]  [phone]  I increased your Lexapro dose.   I refilled your medications. Please start back on Ozempic and other medications. Let me know if you have any trouble getting them.   Follow up in 4 weeks

## 2022-03-01 ENCOUNTER — Encounter: Payer: Self-pay | Admitting: Family Medicine

## 2022-03-01 DIAGNOSIS — E1165 Type 2 diabetes mellitus with hyperglycemia: Secondary | ICD-10-CM

## 2022-03-01 MED ORDER — METFORMIN HCL 1000 MG PO TABS: 1000.0000 mg | ORAL_TABLET | Freq: Two times a day (BID) | ORAL | 0 refills | Status: DC

## 2022-03-01 MED ORDER — OZEMPIC (0.25 OR 0.5 MG/DOSE) 2 MG/3ML ~~LOC~~ SOPN: 0.2500 mg | PEN_INJECTOR | SUBCUTANEOUS | 0 refills | Status: DC

## 2022-03-01 NOTE — Assessment & Plan Note (Signed)
No recent follow up with GI. Referral to get established for this and other chronic GI symptoms as well as colon cancer screening.  

## 2022-03-01 NOTE — Assessment & Plan Note (Signed)
Her mood is affected by her weight as well as several underlying chronic health conditions including diabetes. Referral to [facility].

## 2022-03-01 NOTE — Progress Notes (Signed)
Her vitamin B12 level is low. I recommend once weekly B12 injections for the next 4-6 weeks. Please have her come in for this. Her diabetes is still uncontrolled. Getting back on metformin, Farxiga and Ozempic is needed. We will need to discuss this further at her follow up.

## 2022-03-01 NOTE — Assessment & Plan Note (Signed)
Not at goal. Sent new prescription for lisinopril to pharmacy and she will start back on medication. Monitor BP at home. Follow up in 4 wks

## 2022-03-01 NOTE — Assessment & Plan Note (Signed)
Not well controlled. Increase Lexapro dose and follow up in 4 wks.

## 2022-03-01 NOTE — Assessment & Plan Note (Signed)
No recent follow up with GI. Referral to get established for this and other chronic GI symptoms as well as colon cancer screening.

## 2022-03-01 NOTE — Telephone Encounter (Signed)
Called [facility] and they report Ozempic is not in stock at their location and the Metformin is not covered at their store but can be sent to another pharmacy for coverage.   Called pt and informed her of my findings. [facility] and to send [facility]empic there as well to see if they have it in stock. [facility].

## 2022-03-02 ENCOUNTER — Ambulatory Visit (INDEPENDENT_AMBULATORY_CARE_PROVIDER_SITE_OTHER): Payer: Commercial Managed Care - HMO | Admitting: *Deleted

## 2022-03-02 ENCOUNTER — Encounter: Payer: Self-pay | Admitting: Family Medicine

## 2022-03-02 ENCOUNTER — Other Ambulatory Visit: Payer: Self-pay | Admitting: Family Medicine

## 2022-03-02 DIAGNOSIS — E1165 Type 2 diabetes mellitus with hyperglycemia: Secondary | ICD-10-CM

## 2022-03-02 DIAGNOSIS — E538 Deficiency of other specified B group vitamins: Secondary | ICD-10-CM

## 2022-03-02 MED ORDER — BENZONATATE 200 MG PO CAPS: 200.0000 mg | ORAL_CAPSULE | Freq: Two times a day (BID) | ORAL | 0 refills | Status: DC | PRN

## 2022-03-02 MED ORDER — CYANOCOBALAMIN 1000 MCG/ML IJ SOLN
1000.0000 ug | Freq: Once | INTRAMUSCULAR | Status: AC
  Administered 2022-03-02: 1000 ug via INTRAMUSCULAR

## 2022-03-02 NOTE — Addendum Note (Signed)
Addended by: Marinus Maw on: 03/02/2022 01:14 PM   Modules accepted: Orders

## 2022-03-02 NOTE — Progress Notes (Signed)
Pls cosign for B12 inj in absence of PCP../lmb   

## 2022-03-02 NOTE — Telephone Encounter (Signed)
Based on pharmacy note, it looks like ozempic may not be covered and they are requesting to switch the medication to [place]. Are you ok with this or want to hold off and possibly start PA?

## 2022-03-03 ENCOUNTER — Other Ambulatory Visit: Payer: Commercial Managed Care - HMO

## 2022-03-09 ENCOUNTER — Telehealth: Payer: Self-pay

## 2022-03-09 ENCOUNTER — Ambulatory Visit (INDEPENDENT_AMBULATORY_CARE_PROVIDER_SITE_OTHER): Payer: Commercial Managed Care - HMO

## 2022-03-09 DIAGNOSIS — E538 Deficiency of other specified B group vitamins: Secondary | ICD-10-CM

## 2022-03-09 MED ORDER — CYANOCOBALAMIN 1000 MCG/ML IJ SOLN
1000.0000 ug | Freq: Once | INTRAMUSCULAR | Status: AC
  Administered 2022-03-09: 1000 ug via INTRAMUSCULAR

## 2022-03-09 NOTE — Telephone Encounter (Signed)
PA started for Ozempic. Key: Matthias Hughs

## 2022-03-09 NOTE — Progress Notes (Signed)
Pt here for weekly B12 injection per Hetty Blend  B12 given IM, and pt tolerated injection well.  Next B12 injection scheduled for 03/16/22

## 2022-03-15 ENCOUNTER — Encounter: Payer: Self-pay | Admitting: Family Medicine

## 2022-03-15 NOTE — Telephone Encounter (Signed)
PA denied.

## 2022-03-15 NOTE — Telephone Encounter (Signed)
Ozempic is not covered, I did PA and it was denied. Her insurance will cover Trulicity and wants to know if you can send that instead?

## 2022-03-16 ENCOUNTER — Other Ambulatory Visit: Payer: Self-pay

## 2022-03-16 ENCOUNTER — Ambulatory Visit (INDEPENDENT_AMBULATORY_CARE_PROVIDER_SITE_OTHER): Payer: Commercial Managed Care - HMO | Admitting: *Deleted

## 2022-03-16 DIAGNOSIS — E538 Deficiency of other specified B group vitamins: Secondary | ICD-10-CM

## 2022-03-16 MED ORDER — TRULICITY 0.75 MG/0.5ML ~~LOC~~ SOAJ: 0.7500 mg | SUBCUTANEOUS | 2 refills | Status: DC

## 2022-03-16 MED ORDER — CYANOCOBALAMIN 1000 MCG/ML IJ SOLN
1000.0000 ug | Freq: Once | INTRAMUSCULAR | Status: AC
  Administered 2022-03-16: 1000 ug via INTRAMUSCULAR

## 2022-03-16 NOTE — Progress Notes (Signed)
Pls cosign for B12 inj in absence of PCP../lmb   

## 2022-03-18 ENCOUNTER — Other Ambulatory Visit: Payer: Self-pay | Admitting: Orthopaedic Surgery

## 2022-03-18 DIAGNOSIS — M5106 Intervertebral disc disorders with myelopathy, lumbar region: Secondary | ICD-10-CM

## 2022-03-21 ENCOUNTER — Other Ambulatory Visit: Payer: Commercial Managed Care - HMO

## 2022-03-23 ENCOUNTER — Ambulatory Visit (INDEPENDENT_AMBULATORY_CARE_PROVIDER_SITE_OTHER): Payer: Commercial Managed Care - HMO | Admitting: *Deleted

## 2022-03-23 DIAGNOSIS — E538 Deficiency of other specified B group vitamins: Secondary | ICD-10-CM

## 2022-03-23 MED ORDER — CYANOCOBALAMIN 1000 MCG/ML IJ SOLN
1000.0000 ug | Freq: Once | INTRAMUSCULAR | Status: AC
  Administered 2022-03-23: 1000 ug via INTRAMUSCULAR

## 2022-03-23 NOTE — Progress Notes (Signed)
Pls cosign for B12 inj in absence of PCP../lmb   

## 2022-03-26 ENCOUNTER — Encounter: Payer: Self-pay | Admitting: Family Medicine

## 2022-03-26 ENCOUNTER — Ambulatory Visit (INDEPENDENT_AMBULATORY_CARE_PROVIDER_SITE_OTHER): Payer: Commercial Managed Care - HMO | Admitting: Family Medicine

## 2022-03-26 VITALS — BP 138/82 | HR 78 | Temp 97.6°F | Ht 68.0 in | Wt 309.4 lb

## 2022-03-26 DIAGNOSIS — F419 Anxiety disorder, unspecified: Secondary | ICD-10-CM

## 2022-03-26 DIAGNOSIS — E1159 Type 2 diabetes mellitus with other circulatory complications: Secondary | ICD-10-CM

## 2022-03-26 DIAGNOSIS — E1165 Type 2 diabetes mellitus with hyperglycemia: Secondary | ICD-10-CM | POA: Diagnosis not present

## 2022-03-26 DIAGNOSIS — I152 Hypertension secondary to endocrine disorders: Secondary | ICD-10-CM

## 2022-03-26 DIAGNOSIS — E538 Deficiency of other specified B group vitamins: Secondary | ICD-10-CM | POA: Diagnosis not present

## 2022-03-26 DIAGNOSIS — F32A Depression, unspecified: Secondary | ICD-10-CM

## 2022-03-26 LAB — VITAMIN B12: Vitamin B-12: 750 pg/mL (ref 211–911)

## 2022-03-26 LAB — BASIC METABOLIC PANEL
BUN: 9 mg/dL (ref 6–23)
CO2: 32 mEq/L (ref 19–32)
Calcium: 9 mg/dL (ref 8.4–10.5)
Chloride: 100 mEq/L (ref 96–112)
Creatinine, Ser: 0.66 mg/dL (ref 0.40–1.20)
GFR: 104.54 mL/min (ref >60.00)
Glucose, Bld: 266 mg/dL — ABNORMAL HIGH (ref 70–99)
Potassium: 4.6 mEq/L (ref 3.5–5.1)
Sodium: 134 mEq/L — ABNORMAL LOW (ref 135–145)

## 2022-03-26 MED ORDER — LISINOPRIL 40 MG PO TABS: 40.0000 mg | ORAL_TABLET | Freq: Every day | ORAL | 1 refills | Status: DC

## 2022-03-26 NOTE — Assessment & Plan Note (Signed)
She has been getting B12 injections x 4 wks. Check B12 level and follow up.

## 2022-03-26 NOTE — Assessment & Plan Note (Signed)
BP not controlled. Increase lisinopril to 40 mg daily. Check BMP.

## 2022-03-26 NOTE — Assessment & Plan Note (Signed)
Continue Lexapro 20 mg and counseling. Upcoming appt with psychiatrist scheduled.

## 2022-03-26 NOTE — Progress Notes (Signed)
Subjective:     Patient ID: April Gonzales, female    DOB: Aug 19, 1975, 46 y.o.   MRN: [id number]  Chief Complaint  Patient presents with   Follow-up    4 week f/u for hypertension and anxiety/depression     HPI Patient is in today for follow up on HTN, DM, anxiety and depression.   HTN- taking lisinopril 20 mg. Is not checking BP at home.   DM- A1c 13.1% on 02/26/2022 and she was not taking all of her medications due to losing health insurance. She started back on Farxiga and Trulicity (Ozempic not covered). She has had 2 doses of Trulicity.    Referred to [facility] but she has not heard from them yet.   B12 def- has been getting B12 injections in our office for the past 4 weeks.   Referred to GI   Has upcoming appointment with psychiatrist for anxiety and depression. Taking Lexapro 20 mg daily.   No new concerns.     Health Maintenance Due  Topic Date Due   COVID-19 Vaccine (1) Never done   FOOT EXAM  Never done   OPHTHALMOLOGY EXAM  Never done   HIV Screening  Never done   Hepatitis C Screening  Never done   PAP SMEAR-Modifier  Never done   COLONOSCOPY (Pts 45-48yrs Insurance coverage will need to be confirmed)  Never done    Past Medical History:  Diagnosis Date   Celiac disease    Hypertension    Psoriasis    Type 2 diabetes mellitus without complication, without long-term current use of insulin ([place]) 11/19/2021    Past Surgical History:  Procedure Laterality Date   CHOLECYSTECTOMY      Family History  Problem Relation Age of Onset   Diabetes Mellitus II Mother    Hypertension Mother    CAD Father    Chronic Renal Failure Father    Hyperlipidemia Father     Social History   Socioeconomic History   Marital status: Single    Spouse name: Not on file   Number of children: Not on file   Years of education: Not on file   Highest education level: Not on file  Occupational History   Not on file  Tobacco Use   Smoking status: Never   Smokeless  tobacco: Never  Substance and Sexual Activity   Alcohol use: No   Drug use: No   Sexual activity: Not on file  Other Topics Concern   Not on file  Social History Narrative   Not on file   Social Determinants of Health   Financial Resource Strain: Not on file  Food Insecurity: Not on file  Transportation Needs: Not on file  Physical Activity: Not on file  Stress: Not on file  Social Connections: Not on file  Intimate Partner Violence: Not on file    Outpatient Medications Prior to Visit  Medication Sig Dispense Refill   dapagliflozin propanediol (FARXIGA) 10 MG TABS tablet Take 1 tablet (10 mg total) by mouth daily before breakfast. 90 tablet 1   Dulaglutide (TRULICITY) A999333 0000000 SOPN Inject 0.75 mg into the skin once a week. 2 mL 2   escitalopram (LEXAPRO) 20 MG tablet Take 1 tablet (20 mg total) by mouth daily. 30 tablet 2   hydrocortisone-pramoxine (PROCTOFOAM-HC) rectal foam Place 1 applicator rectally 2 (two) times daily. 10 g 1   meloxicam (MOBIC) 15 MG tablet Take 15 mg by mouth daily.     metFORMIN (GLUCOPHAGE) 1000  MG tablet Take 1 tablet (1,000 mg total) by mouth 2 (two) times daily with a meal. 180 tablet 0   Risankizumab-rzaa (SKYRIZI) 150 MG/ML SOSY Inject into the skin.     triamcinolone cream (KENALOG) 0.1 % Apply 1 application topically 2 (two) times daily as needed (psoriasis).      lisinopril (ZESTRIL) 20 MG tablet Take 1 tablet (20 mg total) by mouth daily. 90 tablet 1   benzonatate (TESSALON) 200 MG capsule Take 1 capsule (200 mg total) by mouth 2 (two) times daily as needed for cough. (Patient not taking: Reported on 03/26/2022) 20 capsule 0   No facility-administered medications prior to visit.    Allergies  Allergen Reactions   Dilaudid [Hydromorphone Hcl] Nausea And Vomiting   Hydromorphone     Other reaction(s): Unknown   Methotrexate Other (See Comments)    ROS     Objective:    Physical Exam Constitutional:      General: She is not in  acute distress.    Appearance: She is not ill-appearing.  Cardiovascular:     Rate and Rhythm: Normal rate.  Pulmonary:     Effort: Pulmonary effort is normal.  Neurological:     General: No focal deficit present.     Mental Status: She is alert and oriented to person, place, and time.  Psychiatric:        Mood and Affect: Mood normal.        Behavior: Behavior normal.     BP 138/82 (BP Location: Left Arm, Patient Position: Sitting, Cuff Size: Large)   Pulse 78   Temp 97.6 F (36.4 C) (Temporal)   Ht 5\' 8"  (1.727 m)   Wt (!) 309 lb 6.4 oz (140.3 kg)   SpO2 99%   BMI 47.04 kg/m  Wt Readings from Last 3 Encounters:  03/26/22 (!) 309 lb 6.4 oz (140.3 kg)  02/26/22 (!) 309 lb (140.2 kg)  12/24/21 (!) 309 lb (140.2 kg)       Assessment & Plan:   Problem List Items Addressed This Visit       Cardiovascular and Mediastinum   Hypertension associated with diabetes (HCC)    BP not controlled. Increase lisinopril to 40 mg daily. Check BMP.       Relevant Medications   lisinopril (ZESTRIL) 40 MG tablet   Other Relevant Orders   Basic metabolic panel (Completed)     Endocrine   Uncontrolled type 2 diabetes mellitus with hyperglycemia (HCC) - Primary    Started on Trulicity 2 weeks ago. Ozempic not covered by insurance.  She declines taking daily injections at this time. We have discussed that GLP-1 medications have an increased risk of pancreatitis and she has a hx of pancreatitis. Advised to stop the medication and report back any abdominal pain or signs of pancreatitis. Continue metformin 1,000 mg bid and Farxiga 10 mg daily.  Consider adding Amaryl at her follow up if needed since she is not in favor of insulin.  Referral placed to [facility] and hopefully she will hear from them soon.       Relevant Medications   lisinopril (ZESTRIL) 40 MG tablet   Other Relevant Orders   Basic metabolic panel (Completed)     Other   Anxiety and depression    Continue Lexapro 20 mg and  counseling. Upcoming appt with psychiatrist scheduled.       B12 deficiency    She has been getting B12 injections x 4 wks. Check B12 level and  follow up.       Relevant Orders   Vitamin B12 (Completed)    I have discontinued [name] Free A. Siebenaler's lisinopril and benzonatate. I am also having her start on lisinopril. Additionally, I am having her maintain her meloxicam, triamcinolone cream, hydrocortisone-pramoxine, dapagliflozin propanediol, escitalopram, metFORMIN, Trulicity, and [employer].  Meds ordered this encounter  Medications   lisinopril (ZESTRIL) 40 MG tablet    Sig: Take 1 tablet (40 mg total) by mouth daily.    Dispense:  90 tablet    Refill:  1    Order Specific Question:   Supervising Provider    Answer:   Pricilla Holm A [id number]

## 2022-03-26 NOTE — Assessment & Plan Note (Signed)
Started on Trulicity 2 weeks ago. Ozempic not covered by insurance.  She declines taking daily injections at this time. We have discussed that GLP-1 medications have an increased risk of pancreatitis and she has a hx of pancreatitis. Advised to stop the medication and report back any abdominal pain or signs of pancreatitis. Continue metformin 1,000 mg bid and Farxiga 10 mg daily.  Consider adding Amaryl at her follow up if needed since she is not in favor of insulin.  Referral placed to [facility] and hopefully she will hear from them soon.

## 2022-03-26 NOTE — Patient Instructions (Signed)
I am increasing your lisinopril from 20 mg to 40 mg daily.   Continue your current diabetes medications.   Hopefully you will hear from [facility] and [facility] soon. You can reach out to [facility] if you would like. [phone]

## 2022-04-06 ENCOUNTER — Ambulatory Visit (INDEPENDENT_AMBULATORY_CARE_PROVIDER_SITE_OTHER): Payer: Commercial Managed Care - HMO | Admitting: Family Medicine

## 2022-04-06 ENCOUNTER — Encounter (INDEPENDENT_AMBULATORY_CARE_PROVIDER_SITE_OTHER): Payer: Self-pay | Admitting: Family Medicine

## 2022-04-06 VITALS — BP 145/81 | HR 82 | Temp 98.4°F | Ht 65.0 in | Wt 302.0 lb

## 2022-04-06 DIAGNOSIS — Z7984 Long term (current) use of oral hypoglycemic drugs: Secondary | ICD-10-CM

## 2022-04-06 DIAGNOSIS — Z0289 Encounter for other administrative examinations: Secondary | ICD-10-CM

## 2022-04-06 DIAGNOSIS — E669 Obesity, unspecified: Secondary | ICD-10-CM | POA: Diagnosis not present

## 2022-04-06 DIAGNOSIS — Z7409 Other reduced mobility: Secondary | ICD-10-CM

## 2022-04-06 DIAGNOSIS — I10 Essential (primary) hypertension: Secondary | ICD-10-CM

## 2022-04-06 DIAGNOSIS — Z6841 Body Mass Index (BMI) 40.0 and over, adult: Secondary | ICD-10-CM

## 2022-04-06 DIAGNOSIS — Z7985 Long-term (current) use of injectable non-insulin antidiabetic drugs: Secondary | ICD-10-CM

## 2022-04-06 DIAGNOSIS — E1169 Type 2 diabetes mellitus with other specified complication: Secondary | ICD-10-CM | POA: Diagnosis not present

## 2022-04-09 ENCOUNTER — Ambulatory Visit
Admission: RE | Admit: 2022-04-09 | Discharge: 2022-04-09 | Disposition: A | Discharge status: Home / Self Care | Payer: Commercial Managed Care - HMO | Source: Ambulatory Visit | Attending: Orthopaedic Surgery | Admitting: Orthopaedic Surgery

## 2022-04-09 DIAGNOSIS — M5106 Intervertebral disc disorders with myelopathy, lumbar region: Secondary | ICD-10-CM

## 2022-04-09 MED ORDER — GADOPICLENOL 0.5 MMOL/ML IV SOLN
10.0000 mL | Freq: Once | INTRAVENOUS | Status: AC | PRN
  Administered 2022-04-09: 10 mL via INTRAVENOUS

## 2022-04-13 NOTE — Progress Notes (Signed)
Office: [phone]  /  Fax: [phone]   Initial Visit  April Gonzales was seen in clinic today to evaluate for obesity. She is interested in losing weight to improve overall health and reduce the risk of weight related complications. She presents today to review program treatment options, initial physical assessment, and evaluation.     She was referred by: PCP  When asked what else they would like to accomplish? She states: Adopt healthier eating patterns, Improve existing medical conditions, Reduce number of medications, and Lose a target amount of weight : 200 lbs  When asked how has your weight affected you? She states: Contributed to orthopedic problems or mobility issues, Having fatigue, and Other: She lives alone.  Some associated conditions: Hypertension and Diabetes  Contributing factors: Family history, Stress, and Other: She has a pinched nerve in her back with left-sided neuropathy which limits mobility.  Has less pain following PT.  Weight promoting medications identified: Other: n/a  Current nutrition plan: Other: Gluten-free, celiac disease, less beef.  Current level of physical activity: Step counting and Other: Using a walker.  Current or previous pharmacotherapy: None  Response to medication: Never tried medications  Past medical history includes:   Past Medical History:  Diagnosis Date   Celiac disease    Hypertension    Psoriasis    Type 2 diabetes mellitus without complication, without long-term current use of insulin (HCC) 11/19/2021   Objective:   BP (!) 145/81   Pulse 82   Temp 98.4 F (36.9 C)   Ht 5\' 5"  (1.651 m)   Wt (!) 302 lb (137 kg)   SpO2 99%   BMI 50.26 kg/m  She was weighed on the bioimpedance scale: Body mass index is 50.26 kg/m.  Peak Weight:370 lbs ,Visceral Fat Rating:20, Body Fat%:56.2  General:  Alert, oriented and cooperative. Patient is in no acute distress.  Respiratory: Normal respiratory effort, no problems with  respiration noted  Extremities: Normal range of motion.    Mental Status: Normal mood and affect. Normal behavior. Normal judgment and thought content.   Assessment and Plan:  1. Type 2 diabetes mellitus with other specified complication, without long-term current use of insulin (HCC) Last A1c 13.1 on 02/26/2022.  Patient is on Trulicity 0.75 mg injection weekly, Farxiga 10 mg daily, metformin 1000 mg twice daily.  Patient is feeling increased satiety and has a history of pancreatitis.  Continue current medications per PCP.  Anticipate improvements with weight loss.  2. Essential hypertension Patient is on lisinopril 40 mg daily.  She is getting a new home blood pressure cuff.  Monitor home blood pressure at rest 2 times a week.  3. Poor mobility Awaiting MRI of L-spine with Dr. 13/01/2022.  Back pain responded well to PT but neuropathy limits walking.  Weight reduction and regular exercise may help mobility long-term.  Follow-up with Dr. Noel Gerold following MRI.  4. Obesity, current BMI 50.3 1.  Review bioimpedance. 2.  Reviewed the role of bariatric surgery given BMI 50, and type 2 diabetes mellitus with A1c of 13.1 and lifelong obesity struggle.  We reviewed weight, biometrics, associated medical conditions and contributing factors with patient. She would benefit from weight loss therapy via a modified calorie, low-carb, high-protein nutritional plan tailored to their REE (resting energy expenditure) which will be determined by indirect calorimetry.  We will also assess for cardiometabolic risk and nutritional derangements via fasting serologies at her next appointment.     Obesity Treatment / Action Plan:  Patient  will work on garnering support from family and friends to begin weight loss journey. Will complete provided nutritional and psychosocial assessment questionnaire before the next appointment. Will be scheduled for indirect calorimetry to determine resting energy expenditure in a  fasting state.  This will allow [place] to create a reduced calorie, high-protein meal plan to promote loss of fat mass while preserving muscle mass. Will reduce the frequency of eating out and making healthier choices by advanced menu planning. Was counseled on nutritional approaches to weight loss and benefits of complex carbs and high quality protein as part of nutritional weight management.  Obesity Education Performed Today:  She was weighed on the bioimpedance scale and results were discussed and documented in the synopsis.  We discussed obesity as a disease and the importance of a more detailed evaluation of all the factors contributing to the disease.  We discussed the importance of long term lifestyle changes which include nutrition, exercise and behavioral modifications as well as the importance of customizing this to her specific health and social needs.  We discussed the benefits of reaching a healthier weight to alleviate the symptoms of existing conditions and reduce the risks of the biomechanical, metabolic and psychological effects of obesity.  RIMA BLIZZARD appears to be in the action stage of change and states they are ready to start intensive lifestyle modifications and behavioral modifications.  30 minutes was spent today on this visit including the above counseling, pre-visit chart review, and post-visit documentation.  Reviewed by clinician on day of visit: allergies, medications, problem list, medical history, surgical history, family history, social history, and previous encounter notes.  I, Malcolm Metro, am acting as Energy manager for Seymour Bars, DO.  I have reviewed the above documentation for accuracy and completeness, and I agree with the above. Glennis Brink, DO

## 2022-05-12 ENCOUNTER — Other Ambulatory Visit: Payer: Self-pay | Admitting: Family Medicine

## 2022-05-12 DIAGNOSIS — E1165 Type 2 diabetes mellitus with hyperglycemia: Secondary | ICD-10-CM

## 2022-05-19 ENCOUNTER — Encounter (INDEPENDENT_AMBULATORY_CARE_PROVIDER_SITE_OTHER): Payer: Self-pay | Admitting: Family Medicine

## 2022-05-19 ENCOUNTER — Ambulatory Visit (INDEPENDENT_AMBULATORY_CARE_PROVIDER_SITE_OTHER): Payer: Commercial Managed Care - HMO | Admitting: Family Medicine

## 2022-05-19 VITALS — BP 174/84 | HR 94 | Temp 98.9°F | Ht 65.0 in | Wt 299.0 lb

## 2022-05-19 DIAGNOSIS — R0602 Shortness of breath: Secondary | ICD-10-CM

## 2022-05-19 DIAGNOSIS — R5383 Other fatigue: Secondary | ICD-10-CM | POA: Diagnosis not present

## 2022-05-19 DIAGNOSIS — Z1331 Encounter for screening for depression: Secondary | ICD-10-CM | POA: Diagnosis not present

## 2022-05-19 DIAGNOSIS — Z8719 Personal history of other diseases of the digestive system: Secondary | ICD-10-CM

## 2022-05-19 DIAGNOSIS — M4800 Spinal stenosis, site unspecified: Secondary | ICD-10-CM

## 2022-05-19 DIAGNOSIS — I10 Essential (primary) hypertension: Secondary | ICD-10-CM

## 2022-05-19 DIAGNOSIS — Z6841 Body Mass Index (BMI) 40.0 and over, adult: Secondary | ICD-10-CM

## 2022-05-19 DIAGNOSIS — E1169 Type 2 diabetes mellitus with other specified complication: Secondary | ICD-10-CM | POA: Diagnosis not present

## 2022-05-19 DIAGNOSIS — Z7984 Long term (current) use of oral hypoglycemic drugs: Secondary | ICD-10-CM

## 2022-05-19 DIAGNOSIS — Z7985 Long-term (current) use of injectable non-insulin antidiabetic drugs: Secondary | ICD-10-CM

## 2022-05-19 DIAGNOSIS — F3289 Other specified depressive episodes: Secondary | ICD-10-CM | POA: Diagnosis not present

## 2022-05-20 LAB — VITAMIN D 25 HYDROXY (VIT D DEFICIENCY, FRACTURES): Vit D, 25-Hydroxy: 4.7 ng/mL — ABNORMAL LOW (ref 30.0–100.0)

## 2022-05-20 LAB — INSULIN, RANDOM: INSULIN: 79.5 u[IU]/mL — ABNORMAL HIGH (ref 2.6–24.9)

## 2022-05-20 LAB — FOLATE: Folate: 3.3 ng/mL (ref >3.0)

## 2022-05-24 ENCOUNTER — Telehealth: Payer: Self-pay | Admitting: Family Medicine

## 2022-05-24 NOTE — Telephone Encounter (Signed)
Dr. Phylliss Bob called to discuss the pt's disability pw with [employer] Dingwall. Told Dr. Phylliss Bob that Loletha Carrow is not in on Monday's and that we would return the call as soon as possible.   Please call: [phone]

## 2022-05-25 NOTE — Telephone Encounter (Signed)
ATC number provided but was given message "call cannot be completed at this time, please try again later"   Will try again later

## 2022-05-26 ENCOUNTER — Telehealth: Payer: Self-pay

## 2022-05-26 NOTE — Telephone Encounter (Signed)
Received call from pt's insurance requesting Vickie contact them to do a peer to peer in regards to pt's disability claim. The disability claim is in regards to pt being out of work due to anxiety and depression from January 12, 2022 - present.   Number for peer to peer: [phone]

## 2022-05-27 ENCOUNTER — Encounter: Payer: Self-pay | Admitting: Family Medicine

## 2022-05-27 ENCOUNTER — Ambulatory Visit (INDEPENDENT_AMBULATORY_CARE_PROVIDER_SITE_OTHER): Payer: Commercial Managed Care - HMO | Admitting: Family Medicine

## 2022-05-27 VITALS — BP 136/80 | HR 88 | Temp 97.6°F | Ht 65.0 in | Wt 304.0 lb

## 2022-05-27 DIAGNOSIS — Z8719 Personal history of other diseases of the digestive system: Secondary | ICD-10-CM

## 2022-05-27 DIAGNOSIS — E1159 Type 2 diabetes mellitus with other circulatory complications: Secondary | ICD-10-CM | POA: Diagnosis not present

## 2022-05-27 DIAGNOSIS — E1165 Type 2 diabetes mellitus with hyperglycemia: Secondary | ICD-10-CM | POA: Diagnosis not present

## 2022-05-27 DIAGNOSIS — F32A Depression, unspecified: Secondary | ICD-10-CM

## 2022-05-27 DIAGNOSIS — E538 Deficiency of other specified B group vitamins: Secondary | ICD-10-CM | POA: Diagnosis not present

## 2022-05-27 DIAGNOSIS — F419 Anxiety disorder, unspecified: Secondary | ICD-10-CM

## 2022-05-27 DIAGNOSIS — I152 Hypertension secondary to endocrine disorders: Secondary | ICD-10-CM | POA: Diagnosis not present

## 2022-05-27 DIAGNOSIS — E559 Vitamin D deficiency, unspecified: Secondary | ICD-10-CM

## 2022-05-27 LAB — COMPREHENSIVE METABOLIC PANEL
ALT: 31 U/L (ref 0–35)
AST: 29 U/L (ref 0–37)
Albumin: 3.7 g/dL (ref 3.5–5.2)
Alkaline Phosphatase: 122 U/L — ABNORMAL HIGH (ref 39–117)
BUN: 11 mg/dL (ref 6–23)
CO2: 28 mEq/L (ref 19–32)
Calcium: 9.4 mg/dL (ref 8.4–10.5)
Chloride: 98 mEq/L (ref 96–112)
Creatinine, Ser: 0.69 mg/dL (ref 0.40–1.20)
GFR: 103.31 mL/min (ref >60.00)
Glucose, Bld: 263 mg/dL — ABNORMAL HIGH (ref 70–99)
Potassium: 4.7 mEq/L (ref 3.5–5.1)
Sodium: 136 mEq/L (ref 135–145)
Total Bilirubin: 0.5 mg/dL (ref 0.2–1.2)
Total Protein: 7.9 g/dL (ref 6.0–8.3)

## 2022-05-27 LAB — CBC WITH DIFFERENTIAL/PLATELET
Basophils Absolute: 0.1 10*3/uL (ref 0.0–0.1)
Basophils Relative: 0.6 % (ref 0.0–3.0)
Eosinophils Absolute: 0.2 10*3/uL (ref 0.0–0.7)
Eosinophils Relative: 1.8 % (ref 0.0–5.0)
HCT: 42.2 % (ref 36.0–46.0)
Hemoglobin: 13.5 g/dL (ref 12.0–15.0)
Lymphocytes Relative: 20.2 % (ref 12.0–46.0)
Lymphs Abs: 2.4 10*3/uL (ref 0.7–4.0)
MCHC: 32.1 g/dL (ref 30.0–36.0)
MCV: 80.4 fl (ref 78.0–100.0)
Monocytes Absolute: 0.6 10*3/uL (ref 0.1–1.0)
Monocytes Relative: 5 % (ref 3.0–12.0)
Neutro Abs: 8.5 10*3/uL — ABNORMAL HIGH (ref 1.4–7.7)
Neutrophils Relative %: 72.4 % (ref 43.0–77.0)
Platelets: 291 10*3/uL (ref 150.0–400.0)
RBC: 5.24 Mil/uL — ABNORMAL HIGH (ref 3.87–5.11)
RDW: 15.8 % — ABNORMAL HIGH (ref 11.5–15.5)
WBC: 11.8 10*3/uL — ABNORMAL HIGH (ref 4.0–10.5)

## 2022-05-27 LAB — LIPASE: Lipase: 7 U/L — ABNORMAL LOW (ref 11.0–59.0)

## 2022-05-27 LAB — TSH: TSH: 1.94 u[IU]/mL (ref 0.35–5.50)

## 2022-05-27 LAB — VITAMIN B12: Vitamin B-12: 247 pg/mL (ref 211–911)

## 2022-05-27 LAB — HEMOGLOBIN A1C: Hgb A1c MFr Bld: 12.3 % — ABNORMAL HIGH (ref 4.6–6.5)

## 2022-05-27 MED ORDER — TRULICITY 1.5 MG/0.5ML ~~LOC~~ SOAJ: 1.5000 mg | SUBCUTANEOUS | 0 refills | Status: DC

## 2022-05-27 MED ORDER — GLIPIZIDE ER 5 MG PO TB24: 5.0000 mg | ORAL_TABLET | Freq: Every day | ORAL | 0 refills | Status: DC

## 2022-05-27 MED ORDER — VITAMIN D (ERGOCALCIFEROL) 1.25 MG (50000 UNIT) PO CAPS: 50000.0000 [IU] | ORAL_CAPSULE | ORAL | 2 refills | Status: DC

## 2022-05-27 NOTE — Patient Instructions (Addendum)
Please go downstairs for labs.   I increased your Trulicity dose and added a pill to take with breakfast for diabetes.   Continue your current medications.   You will hear from the endocrinologist to schedule a visit for your diabetes.   Start on once weekly vitamin D.   Follow up with [facility]

## 2022-05-27 NOTE — Progress Notes (Signed)
Subjective:     Patient ID: April Gonzales, female    DOB: [birth year], 47 y.o.   MRN: [id number]  Chief Complaint  Patient presents with   Follow-up    2 month f/u    HPI Patient is in today for follow up on multiple health conditions.   A1c last time was 13% Reports good compliance with Farxiga, 10 mg, metformin XX123456 mg bid, and Trulicity A999333 mg weekly.   HTN- taking lisinopril 40 mg daily   She is not on a statin due to lipids being in goal range. LDL <60  She has a psychiatrist with [facility].  She also has a therapist.  Her mood is improving.   Spine and Scoliosis - started gabapentin  Moderate spinal stenosis.   Works at [employer] - states her back specialist and psychiatrist have filled out disability paperwork.   Denies fever, chills, dizziness, chest pain, palpitations, shortness of breath, abdominal pain, N/V/D, urinary symptoms, LE edema.     Health Maintenance Due  Topic Date Due   FOOT EXAM  Never done   OPHTHALMOLOGY EXAM  Never done   HIV Screening  Never done   Hepatitis C Screening  Never done   PAP SMEAR-Modifier  Never done   COLONOSCOPY (Pts 45-95yr Insurance coverage will need to be confirmed)  Never done    Past Medical History:  Diagnosis Date   Anemia    Anxiety    B12 deficiency    Back pain    Celiac disease    Chronic fatigue syndrome    Constipation    Depression    Gallbladder problem    Heartburn    Hypertension    Joint pain    Pancreatic disease    Psoriasis    SOBOE (shortness of breath on exertion)    Type 2 diabetes mellitus without complication, [place] 11/19/[place]   Vitamin D deficiency     Past Surgical History:  Procedure Laterality Date   CHOLECYSTECTOMY     WISDOM TOOTH EXTRACTION      Family History  Problem Relation Age of Onset   Hyperlipidemia Mother    Diabetes Mother    Diabetes Mellitus II Mother    Hypertension Mother    Obesity Mother    Obesity  Father    Hypertension Father    CAD Father    Chronic Renal Failure Father    Hyperlipidemia Father    Heart disease Father    Kidney disease Father    Alcoholism Father     Social History   Socioeconomic History   Marital status: Single    Spouse name: Not on file   Number of children: Not on file   Years of education: Not on file   Highest education level: Not on file  Occupational History   Occupation: Currently on leave of absence  Tobacco Use   Smoking status: Never   Smokeless tobacco: Never  Substance and Sexual Activity   Alcohol use: No   Drug use: No   Sexual activity: Not on file  Other Topics Concern   Not on file  Social History Narrative   Not on file   Social Determinants of Health   Financial Resource Strain: Not on file  Food Insecurity: Not on file  Transportation Needs: Not on file  Physical Activity: Not on file  Stress: Not on file  Social Connections: Not on file  Intimate Partner Violence: Not on  file    Outpatient Medications Prior to Visit  Medication Sig Dispense Refill   busPIRone (BUSPAR) 5 MG tablet Take 5 mg by mouth 2 (two) times daily.     dapagliflozin propanediol (FARXIGA) 10 MG TABS tablet Take 1 tablet (10 mg total) by mouth daily before breakfast. 90 tablet 1   escitalopram (LEXAPRO) 20 MG tablet Take 1 tablet (20 mg total) by mouth daily. 30 tablet 2   hydrocortisone-pramoxine (PROCTOFOAM-HC) rectal foam Place 1 applicator rectally 2 (two) times daily. 10 g 1   lisinopril (ZESTRIL) 40 MG tablet Take 1 tablet (40 mg total) by mouth daily. 90 tablet 1   metFORMIN (GLUCOPHAGE) 1000 MG tablet TAKE 1 TABLET (1,000 MG TOTAL) BY MOUTH TWICE A DAY WITH FOOD 180 tablet 0   Risankizumab-rzaa (SKYRIZI) 150 MG/ML SOSY Inject into the skin.     triamcinolone cream (KENALOG) 0.1 % Apply 1 application topically 2 (two) times daily as needed (psoriasis).      Dulaglutide (TRULICITY) A999333 0000000 SOPN Inject 0.75 mg into the skin once a  week. 2 mL 2   No facility-administered medications prior to visit.    Allergies  Allergen Reactions   Dilaudid [Hydromorphone Hcl] Nausea And Vomiting   Hydromorphone     Other reaction(s): Unknown   Methotrexate Other (See Comments)    ROS     Objective:    Physical Exam Constitutional:      General: She is not in acute distress.    Appearance: She is not ill-appearing.  Cardiovascular:     Rate and Rhythm: Normal rate.  Pulmonary:     Effort: Pulmonary effort is normal.  Skin:    General: Skin is warm and dry.  Neurological:     General: No focal deficit present.     Mental Status: She is alert and oriented to person, place, and time.  Psychiatric:        Mood and Affect: Mood normal.        Behavior: Behavior normal.        Thought Content: Thought content normal.     BP 136/80 (BP Location: Left Arm, Patient Position: Sitting, Cuff Size: Large)   Pulse 88   Temp 97.6 F (36.4 C) (Temporal)   Ht 5' 5"$  (1.651 m)   Wt (!) 304 lb (137.9 kg)   SpO2 97%   BMI 50.59 kg/m  Wt Readings from Last 3 Encounters:  05/27/22 (!) 304 lb (137.9 kg)  05/19/22 299 lb (135.6 kg)  04/06/22 (!) 302 lb (137 kg)       Assessment & Plan:   Problem List Items Addressed This Visit       Cardiovascular and Mediastinum   Hypertension associated with diabetes ([place])    Blood pressure controlled.  Continue lisinopril 40 mg daily.  Recommend low-sodium diet.      Relevant Medications   Dulaglutide (TRULICITY) 1.5 0000000 SOPN   glipiZIDE (GLUCOTROL XL) 5 MG 24 hr tablet   Other Relevant Orders   CBC with Differential/Platelet (Completed)   Comprehensive metabolic panel (Completed)     Endocrine   Uncontrolled type 2 diabetes mellitus with hyperglycemia ([place]) - Primary    Unfortunately we have not been able to get her diabetes under control.  She has not been in favor of insulin, does not want a daily injection.  She will continue Farxiga10 mg and metformin 1,000 mg bid.   She has been doing well with Trulicity and I will increase her dose today.  She does have a history of pancreatitis and is aware that if she starts having any abdominal pain, nausea or vomiting that she will need to stop the medication and follow-up with me.  I will add glipizide XR today.  Urgent referral to endocrinology.  In order to have back surgery which she needs due to spinal stenosis she needs to get her A1c close to 7% range. She is seeing [facility].      Relevant Medications   Dulaglutide (TRULICITY) 1.5 0000000 SOPN   glipiZIDE (GLUCOTROL XL) 5 MG 24 hr tablet   Other Relevant Orders   CBC with Differential/Platelet (Completed)   Comprehensive metabolic panel (Completed)   Hemoglobin A1c (Completed)   TSH (Completed)   Ambulatory referral to Endocrinology     Other   Anxiety and depression    She is now under the [facility]  She also has a therapist.  Her mood is good.      B12 deficiency   Relevant Orders   Vitamin B12 (Completed)   History of pancreatitis    No signs of pancreatitis.  Continue to closely monitor.      Relevant Orders   Lipase (Completed)   Morbid obesity (HCC)   Relevant Medications   Dulaglutide (TRULICITY) 1.5 0000000 SOPN   glipiZIDE (GLUCOTROL XL) 5 MG 24 hr tablet   Other Relevant Orders   TSH (Completed)   Vitamin D deficiency    Vitamin D prescribed.      Relevant Medications   Vitamin D, Ergocalciferol, (DRISDOL) 1.25 MG (50000 UNIT) CAPS capsule    I have discontinued [name] Free A. Zadrozny's Trulicity. I am also having her start on Vitamin D (Ergocalciferol), Trulicity, and glipiZIDE. Additionally, I am having her maintain her triamcinolone cream, hydrocortisone-pramoxine, dapagliflozin propanediol, escitalopram, Skyrizi, lisinopril, metFORMIN, and busPIRone.  Meds ordered this encounter  Medications   Vitamin D, Ergocalciferol, (DRISDOL) 1.25 MG (50000 UNIT) CAPS capsule     Sig: Take 1 capsule (50,000 Units total) by mouth every 7 (seven) days.    Dispense:  4 capsule    Refill:  2    Order Specific Question:   Supervising Provider    Answer:   Pricilla Holm A [[id number]]   Dulaglutide (TRULICITY) 1.5 0000000 SOPN    Sig: Inject 1.5 mg into the skin once a week.    Dispense:  6 mL    Refill:  0    Order Specific Question:   Supervising Provider    Answer:   Pricilla Holm A [[id number]]   glipiZIDE (GLUCOTROL XL) 5 MG 24 hr tablet    Sig: Take 1 tablet (5 mg total) by mouth daily with breakfast.    Dispense:  90 tablet    Refill:  0    Order Specific Question:   Supervising Provider    Answer:   Pricilla Holm A [id number]

## 2022-05-28 DIAGNOSIS — E559 Vitamin D deficiency, unspecified: Secondary | ICD-10-CM | POA: Insufficient documentation

## 2022-05-28 NOTE — Assessment & Plan Note (Signed)
Vitamin D prescribed.

## 2022-05-28 NOTE — Assessment & Plan Note (Signed)
Blood pressure controlled.  Continue lisinopril 40 mg daily.  Recommend low-sodium diet.

## 2022-05-28 NOTE — Assessment & Plan Note (Signed)
No signs of pancreatitis.  Continue to closely monitor.

## 2022-05-28 NOTE — Assessment & Plan Note (Signed)
Unfortunately we have not been able to get her diabetes under control.  She has not been in favor of insulin, does not want a daily injection.  She will continue Farxiga10 mg and metformin 1,000 mg bid.  She has been doing well with Trulicity and I will increase her dose today.  She does have a history of pancreatitis and is aware that if she starts having any abdominal pain, nausea or vomiting that she will need to stop the medication and follow-up with me.  I will add glipizide XR today.  Urgent referral to endocrinology.  In order to have back surgery which she needs due to spinal stenosis she needs to get her A1c close to 7% range. [facility] is seeing [facility].

## 2022-05-28 NOTE — Assessment & Plan Note (Signed)
She is now under the care of [facility].  She also has a therapist.  Her mood is good.

## 2022-06-02 ENCOUNTER — Encounter (INDEPENDENT_AMBULATORY_CARE_PROVIDER_SITE_OTHER): Payer: Self-pay | Admitting: Family Medicine

## 2022-06-02 ENCOUNTER — Ambulatory Visit (INDEPENDENT_AMBULATORY_CARE_PROVIDER_SITE_OTHER): Payer: Commercial Managed Care - HMO | Admitting: Family Medicine

## 2022-06-02 VITALS — BP 138/79 | HR 84 | Temp 98.4°F | Ht 65.0 in | Wt 299.0 lb

## 2022-06-02 DIAGNOSIS — E1165 Type 2 diabetes mellitus with hyperglycemia: Secondary | ICD-10-CM | POA: Diagnosis not present

## 2022-06-02 DIAGNOSIS — G4730 Sleep apnea, unspecified: Secondary | ICD-10-CM | POA: Diagnosis not present

## 2022-06-02 DIAGNOSIS — Z6841 Body Mass Index (BMI) 40.0 and over, adult: Secondary | ICD-10-CM

## 2022-06-02 DIAGNOSIS — E66813 Obesity, class 3: Secondary | ICD-10-CM

## 2022-06-02 DIAGNOSIS — E88819 Insulin resistance, unspecified: Secondary | ICD-10-CM | POA: Diagnosis not present

## 2022-06-02 DIAGNOSIS — Z7984 Long term (current) use of oral hypoglycemic drugs: Secondary | ICD-10-CM

## 2022-06-02 DIAGNOSIS — E559 Vitamin D deficiency, unspecified: Secondary | ICD-10-CM

## 2022-06-02 MED ORDER — VITAMIN D (ERGOCALCIFEROL) 1.25 MG (50000 UNIT) PO CAPS: 50000.0000 [IU] | ORAL_CAPSULE | ORAL | 0 refills | Status: DC

## 2022-06-02 NOTE — Assessment & Plan Note (Signed)
0 pounds of weight loss in 2 weeks of medically supervised weight management Patient has struggled to comply with prescribed meal plan due to financial strain and hyperphasia.  Reviewed budget friendly grocery store options for fiber and lean protein with specific examples provided today.  Will reduce intake of highly processed food, added sugar and meals out.  Reviewed website for food stamp application.

## 2022-06-02 NOTE — Assessment & Plan Note (Signed)
Patient does endorse daytime somnolence, snoring at night and sometimes morning headaches.  She has a thick neck circumference and appears to be high risk for obstructive sleep apnea.  Referral to neurology placed for polysomnogram.

## 2022-06-02 NOTE — Assessment & Plan Note (Signed)
Reviewed labs from last visit Fasting insulin very high at 79.5 consistent with hyperinsulinemia Reviewed metabolic sequelae of insulin resistance including increased hunger and weight gain from excess intake of carbohydrates and sugar.  Will focus on a low sugar, reduced refined carbohydrate diet, weight loss and regular exercise  Patient is on Trulicity and metformin through her PCP

## 2022-06-02 NOTE — Assessment & Plan Note (Signed)
Reviewed her most recent A1c drawn by her PCP 05/27/2022 at 12.3.  Her Trulicity dose was recently increased to 1.5 mg once weekly injection and she has yet to start this.  She is also on Farxiga 10 mg daily, glipizide XL 5 mg daily and metformin XR 1000 mg twice daily AM fasting glucoses are over 200  Counseled on dietary changes to improve glucose control Look for improved satiety with her higher dose of Trulicity Insurance did not cover Mounjaro or Ozempic Consider bariatric surgery as a long-term solution

## 2022-06-02 NOTE — Assessment & Plan Note (Signed)
Reviewed labs from last visit including a vitamin D level low at 4.7.  Her PCP did start her on prescription vitamin D 50,000 IU once weekly last week.  She does complain of fatigue.  Due to her extremely low vitamin D levels, we will increase her vitamin D to 50,000 IU twice weekly and recheck level in 3 months.

## 2022-06-02 NOTE — Progress Notes (Signed)
Office: [phone]  /  Fax: [phone]  WEIGHT SUMMARY AND BIOMETRICS  Medical Weight Loss Height: 5' 5"$  (1.651 m) Weight: 299 lb (135.6 kg) Temp: 98.4 F (36.9 C) Pulse Rate: 84 BP: 138/79 SpO2: 100 % Fasting: no Labs: no Today's Visit #: 2 Weight at Last VIsit: 299lb Weight Lost Since Last Visit: 0  Body Fat %: 55.3 % Fat Mass (lbs): 165.6 lbs Muscle Mass (lbs): 127.2 lbs Total Body Water (lbs): 95.8 lbs Visceral Fat Rating : 20 Starting Date: 05/19/22 Starting Weight: 299lb    HPI  Chief Complaint: OBESITY  April Gonzales is here to discuss her progress with her obesity treatment plan. She is on the the Category 1 Plan and states she is following her eating plan approximately 80 % of the time. She states she is not exercising.    Interval History:  Since last office visit she is down zero pounds since last visit Started short walks at home Has good support from friends Picky about veggies Has financial strain Has not applied for food stamps, currently out of work Reports back grocery shopping including tuna helper, Activia yogurt, canned vegetables, low calorie popcorn, beans, low calorie bread   Pharmacotherapy: Trulicity per PCP  PHYSICAL EXAM:  Blood pressure 138/79, pulse 84, temperature 98.4 F (36.9 C), height 5' 5"$  (1.651 m), weight 299 lb (135.6 kg), SpO2 100 %. Body mass index is 49.76 kg/m.  General: She is overweight, cooperative, alert, well developed, and in no acute distress. PSYCH: Has normal mood, affect and thought process.   HEENT: EOMI, sclerae are anicteric. Lungs: Normal breathing effort, no conversational dyspnea. Extremities: No edema.  Neurologic: No gross sensory or motor deficits. No tremors or fasciculations noted.    DIAGNOSTIC DATA REVIEWED:  BMET    Component Value Date/Time   NA 136 05/27/2022 1524   K 4.7 05/27/2022 1524   CL 98 05/27/2022 1524   CO2 28 05/27/2022 1524   GLUCOSE 263 (H) 05/27/2022 1524   BUN 11  05/27/2022 1524   CREATININE 0.69 05/27/2022 1524   CALCIUM 9.4 05/27/2022 1524   GFRNONAA >60 04/06/2019 1242   GFRAA >60 04/06/2019 1242   Lab Results  Component Value Date   HGBA1C 12.3 (H) 05/27/2022   HGBA1C 6.6 (H) 02/21/2016   Lab Results  Component Value Date   INSULIN 79.5 (H) 05/19/2022   Lab Results  Component Value Date   TSH 1.94 05/27/2022   CBC    Component Value Date/Time   WBC 11.8 (H) 05/27/2022 1524   RBC 5.24 (H) 05/27/2022 1524   HGB 13.5 05/27/2022 1524   HCT 42.2 05/27/2022 1524   PLT 291.0 05/27/2022 1524   MCV 80.4 05/27/2022 1524   MCH 25.7 (L) 04/06/2019 1242   MCHC 32.1 05/27/2022 1524   RDW 15.8 (H) 05/27/2022 1524   Iron Studies No results found for: "IRON", "TIBC", "FERRITIN", "IRONPCTSAT" Lipid Panel     Component Value Date/Time   CHOL 105 12/24/2021 1052   TRIG 119.0 12/24/2021 1052   HDL 38.60 (L) 12/24/2021 1052   CHOLHDL 3 12/24/2021 1052   VLDL 23.8 12/24/2021 1052   LDLCALC 42 12/24/2021 1052   Hepatic Function Panel     Component Value Date/Time   PROT 7.9 05/27/2022 1524   ALBUMIN 3.7 05/27/2022 1524   AST 29 05/27/2022 1524   ALT 31 05/27/2022 1524   ALKPHOS 122 (H) 05/27/2022 1524   BILITOT 0.5 05/27/2022 1524      Component Value Date/Time  TSH 1.94 05/27/2022 1524   Nutritional Lab Results  Component Value Date   VD25OH 4.7 (L) 05/19/2022     ASSESSMENT AND PLAN  TREATMENT PLAN FOR OBESITY:  Recommended Dietary Goals  Leanndra is currently in the action stage of change. As such, her goal is to continue weight management plan. She has agreed to the Category 4 Plan.  Behavioral Intervention  We discussed the following Behavioral Modification Strategies today: increasing lean protein intake, decreasing simple carbohydrates , increasing vegetables, increasing fiber rich foods, increase water intake, decrease liquid calories, work on meal planning and easy cooking plans, and think about ways to increase  physical activity.  Additional resources provided today: NA  Recommended Physical Activity Goals  April Gonzales has been advised to work up to 150 minutes of moderate intensity aerobic activity a week and strengthening exercises 2-3 times per week for cardiovascular health, weight loss maintenance and preservation of muscle mass.   She has agreed to increase physical activity in their day and reduce sedentary time (increase NEAT).    Pharmacotherapy We discussed various medication options to help April Gonzales with her weight loss efforts and we both agreed to none.  ASSOCIATED CONDITIONS ADDRESSED TODAY  Uncontrolled type 2 diabetes mellitus with hyperglycemia [facility]) Assessment & Plan: Reviewed her most recent A1c drawn by her PCP 05/27/2022 at 12.3.  Her Trulicity dose was recently increased to 1.5 mg once weekly injection and she has yet to start this.  She is also on Farxiga 10 mg daily, glipizide XL 5 mg daily and metformin XR 1000 mg twice daily AM fasting glucoses are over 200  Counseled on dietary changes to improve glucose control Look for improved satiety with her higher dose of [employer] did not cover Mounjaro or Ozempic Consider bariatric surgery as a long-term solution   Obesity,current BMI 49.9  Vitamin D deficiency Assessment & Plan: Reviewed labs from last visit including a vitamin D level low at 4.7.  Her PCP did start her on prescription vitamin D 50,000 IU once weekly last week.  She does complain of fatigue.  Due to her extremely low vitamin D levels, we will increase her vitamin D to 50,000 IU twice weekly and recheck level in 3 months.  Orders: -     Vitamin D (Ergocalciferol); Take 1 capsule (50,000 Units total) by mouth 2 (two) times a week.  Dispense: 10 capsule; Refill: 0  Sleep-disordered breathing Assessment & Plan: Patient does endorse daytime somnolence, snoring at night and sometimes morning headaches.  She has a thick neck circumference and appears to be  high risk for obstructive sleep apnea.  Referral to neurology placed for polysomnogram.  Orders: -     Ambulatory referral to Neurology  Morbid obesity ([place])  B12 deficiency  Insulin resistance Assessment & Plan: Reviewed labs from last visit Fasting insulin very high at 79.5 consistent with hyperinsulinemia Reviewed metabolic sequelae of insulin resistance including increased hunger and weight gain from excess intake of carbohydrates and sugar.  Will focus on a low sugar, reduced refined carbohydrate diet, weight loss and regular exercise  Patient is on Trulicity and metformin through her PCP       No follow-ups on file.[name] She was informed of the importance of frequent follow up visits to maximize her success with intensive lifestyle modifications for her multiple health conditions.   ATTESTASTION STATEMENTS:  Reviewed by clinician on day of visit: allergies, medications, problem list, medical history, surgical history, family history, social history, and previous encounter notes.  Time spent on visit including pre-visit chart review and post-visit care and charting was 40 minutes.    Dell Ponto, DO

## 2022-06-03 NOTE — Addendum Note (Signed)
Addended by: Dell Ponto on: 06/03/2022 02:03 PM   Modules accepted: Orders

## 2022-06-10 ENCOUNTER — Encounter: Payer: Self-pay | Admitting: Family Medicine

## 2022-06-10 DIAGNOSIS — E1165 Type 2 diabetes mellitus with hyperglycemia: Secondary | ICD-10-CM

## 2022-06-11 MED ORDER — TRULICITY 1.5 MG/0.5ML ~~LOC~~ SOAJ
1.5000 mg | SUBCUTANEOUS | 0 refills | Status: DC
  Filled 2022-07-05: qty 2, 28d supply, fill #0

## 2022-06-11 NOTE — Progress Notes (Unsigned)
Chief Complaint:   OBESITY April Gonzales (MR# [id number]) is a 47 y.o. female who presents for evaluation and treatment of obesity and related comorbidities. Current BMI is Body mass index is 49.76 kg/m. April Gonzales has been struggling with her weight for many years and has been unsuccessful in either losing weight, maintaining weight loss, or reaching her healthy weight goal.  April Gonzales is currently in the action stage of change and ready to dedicate time achieving and maintaining a healthier weight. April Gonzales is interested in becoming our patient and working on intensive lifestyle modifications including (but not limited to) diet and exercise for weight loss.  April Gonzales is currently not working.  She lives alone, gets up late, and does not exercise. April Gonzales has lunch, a snack, and dinner. She snacks on chips and popcorn.  Trulicity is helping reduce portion sizes. [name] orders [employer] 2-3 times a week and uses [employer] for groceries.  She craves starches and sweets.  April Gonzales's habits were reviewed today and are as follows: her desired weight loss is 99 lbs, she has been heavy most of her life, her heaviest weight ever was 375 pounds, she snacks frequently in the evenings, she is frequently drinking liquids with calories, and she struggles with emotional eating.  Depression Screen April Gonzales's Food and Mood (modified PHQ-9) score was 24.     05/19/2022    9:41 AM  Depression screen PHQ 2/9  Decreased Interest 3  Down, Depressed, Hopeless 3  PHQ - 2 Score 6  Altered sleeping 3  Tired, decreased energy 3  Change in appetite 3  Feeling bad or failure about yourself  3  Trouble concentrating 3  Moving slowly or fidgety/restless 2  Suicidal thoughts 1  PHQ-9 Score 24  Difficult doing work/chores Extremely dIfficult   Subjective:   1. Other fatigue [name] admits to daytime somnolence and admits to waking up still tired. Patient has a history of symptoms of daytime fatigue and morning fatigue. April Gonzales  generally gets 3 or 4 hours of sleep per night, and states that she has poor sleep quality. Snoring "DON'T KNOW" present. Apneic episodes are not present. Epworth Sleepiness Score is 14.  EKG: Normal sinus rhythm without ischemia  2. SOBOE (shortness of breath on exertion) April Gonzales Free notes increasing shortness of breath with exercising and seems to be worsening over time with weight gain. She notes getting out of breath sooner with activity than she used to. This has gotten worse recently. April Gonzales denies shortness of breath at rest or orthopnea. Expected BMR 1986 Actual BMR 2261 = more than expected  3. Type 2 diabetes mellitus with other specified complication, without long-term current use of insulin (HCC) Medications: Farxiga 10 mg daily; Trulicity [id number] mg weekly; Metformin 1,000 mg daily Denies nausea. April Gonzales has known history of pancreatitis x 2. Morning fasting sugar: 201 02/26/2022 A1c 13.1  4. Essential hypertension BP elevated today Medication: lisinopril 40 mg daily [name] did not take medication this morning.  5. 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  6. History of pancreatitis April Gonzales reports 2 previous episodes of pancreatitis without history of stones. She denies abnormal pain, nausea, or vomiting.  7. Spinal stenosis, unspecified spinal region 04/09/2022 MRI lumbar spine reviewed. Status post right laminectomy L5-S1.  Moderate subarticular stenosis B/L Moderate spinal stenosis L3-L4 with L4 nerve root impingement.  Assessment/Plan:   1. Other fatigue April Gonzales does feel that her weight is causing her  energy to be lower than it should be. Fatigue may be related to obesity, depression or many other causes. Labs will be ordered, and in the meanwhile, April Gonzales will focus on self care including making healthy food choices, increasing physical activity and focusing on stress reduction. Labs reviewed and update  labs today.  Lab/Orders today: - EKG 12-Lead - Folate - Insulin, random - VITAMIN D 25 Hydroxy (Vit-D Deficiency, Fractures)  2. SOBOE (shortness of breath on exertion) April Gonzales does feel that she gets out of breath more easily that she used to when she exercises. April Gonzales's shortness of breath appears to be obesity related and exercise induced. She has agreed to work on weight loss and gradually increase exercise to treat her exercise induced shortness of breath. Will continue to monitor closely.  3. Type 2 diabetes mellitus with other specified complication, without long-term current use of insulin (HCC) Continue all meds per PCP. Begins prescribed diet.  4. Essential hypertension Take all BP meds as directed. Look for BP improvements with weight loss.  5. Other depression Consider adding Wellbutrin and/or CBT.  6. History of pancreatitis Continue GLP-1 agonist with caution.  7. Spinal stenosis, unspecified spinal region Exercise as tolerated; plan to add in swimming.  8. Depression screening April Gonzales had a positive depression screening. Depression is commonly associated with obesity and often results in emotional eating behaviors. We will monitor this closely and work on CBT to help improve the non-hunger eating patterns. Referral to Psychology may be required if no improvement is seen as she continues in our clinic.  9. Morbid obesity ([place]) 10. BMI 45.0-49.9, adult ([place]) Cut out regular sodas. Reduce meals out.  [name] is currently in the action stage of change and her goal is to continue with weight loss efforts. I recommend April Gonzales begin the structured treatment plan as follows:  She has agreed to the Category 3 Plan.  Exercise goals: All adults should avoid inactivity. Some physical activity is better than none, and adults who participate in any amount of physical activity gain some health benefits.   Behavioral modification strategies: increasing lean protein intake, increasing  vegetables, increasing water intake, decreasing liquid calories, increasing high fiber foods, decreasing eating out, no skipping meals, meal planning and cooking strategies, keeping healthy foods in the home, and planning for success.  She was informed of the importance of frequent follow-up visits to maximize her success with intensive lifestyle modifications for her multiple health conditions. She was informed we would discuss her lab results at her next visit unless there is a critical issue that needs to be addressed sooner. Trent agreed to keep her next visit at the agreed upon time to discuss these results.  Objective:   Blood pressure (!) 174/84, pulse 94, temperature 98.9 F (37.2 C), height '5\' 5"'$  (1.651 m), weight 299 lb (135.6 kg), SpO2 99 %. Body mass index is 49.76 kg/m.  EKG: Normal sinus rhythm, rate 80.  Indirect Calorimeter completed today shows a VO2 of 328 and a REE of 2261.  Her calculated basal metabolic rate is [id number] thus her basal metabolic rate is better than expected.  General: Cooperative, alert, well developed, in no acute distress. HEENT: Conjunctivae and lids unremarkable. Cardiovascular: Regular rhythm.  Lungs: Normal work of breathing. Neurologic: No focal deficits.   Lab Results  Component Value Date   CREATININE 0.69 05/27/2022   BUN 11 05/27/2022   NA 136 05/27/2022   K 4.7 05/27/2022   CL 98 05/27/2022   CO2 28 05/27/2022  Lab Results  Component Value Date   ALT 31 05/27/2022   AST 29 05/27/2022   ALKPHOS 122 (H) 05/27/2022   BILITOT 0.5 05/27/2022   Lab Results  Component Value Date   HGBA1C 12.3 (H) 05/27/2022   HGBA1C 13.1 (H) 02/26/2022   HGBA1C 13.8 (H) 11/19/2021   HGBA1C 6.6 (H) 02/21/2016   Lab Results  Component Value Date   INSULIN 79.5 (H) 05/19/2022   Lab Results  Component Value Date   TSH 1.94 05/27/2022   Lab Results  Component Value Date   CHOL 105 12/24/2021   HDL 38.60 (L) 12/24/2021   LDLCALC 42 12/24/2021    TRIG 119.0 12/24/2021   CHOLHDL 3 12/24/2021   Lab Results  Component Value Date   WBC 11.8 (H) 05/27/2022   HGB 13.5 05/27/2022   HCT 42.2 05/27/2022   MCV 80.4 05/27/2022   PLT 291.0 05/27/2022   Attestation Statements:   Reviewed by clinician on day of visit: allergies, medications, problem list, medical history, surgical history, family history, social history, and previous encounter notes.  Time spent on visit including pre-visit chart review and post-visit charting and care was 45 minutes.   I, Kathlene November, BS, CMA, am acting as transcriptionist for Loyal Gambler, DO.   I have reviewed the above documentation for accuracy and completeness, and I agree with the above. - ***

## 2022-06-21 ENCOUNTER — Telehealth: Payer: Self-pay

## 2022-06-21 NOTE — Telephone Encounter (Signed)
Patient Advocate Encounter  Prior Authorization for Trulicity 1.'5MG'$ /0.5ML pen-injectors has been approved through [employer].    Effective: 06-21-2022 to 06-21-2023

## 2022-06-21 NOTE — Telephone Encounter (Signed)
PA started for Trulicity Key: [employer]

## 2022-06-22 ENCOUNTER — Encounter (INDEPENDENT_AMBULATORY_CARE_PROVIDER_SITE_OTHER): Payer: Self-pay | Admitting: Physician Assistant

## 2022-06-22 ENCOUNTER — Ambulatory Visit (INDEPENDENT_AMBULATORY_CARE_PROVIDER_SITE_OTHER): Payer: Commercial Managed Care - HMO | Admitting: Physician Assistant

## 2022-06-22 VITALS — BP 139/79 | HR 82 | Temp 98.7°F | Ht 65.0 in | Wt 299.0 lb

## 2022-06-22 DIAGNOSIS — Z6841 Body Mass Index (BMI) 40.0 and over, adult: Secondary | ICD-10-CM

## 2022-06-22 DIAGNOSIS — E1169 Type 2 diabetes mellitus with other specified complication: Secondary | ICD-10-CM

## 2022-06-22 DIAGNOSIS — E559 Vitamin D deficiency, unspecified: Secondary | ICD-10-CM | POA: Diagnosis not present

## 2022-06-22 DIAGNOSIS — Z7985 Long-term (current) use of injectable non-insulin antidiabetic drugs: Secondary | ICD-10-CM

## 2022-06-22 DIAGNOSIS — E669 Obesity, unspecified: Secondary | ICD-10-CM

## 2022-06-22 DIAGNOSIS — Z8719 Personal history of other diseases of the digestive system: Secondary | ICD-10-CM

## 2022-06-22 NOTE — Progress Notes (Signed)
Office: [phone]  /  Fax: [phone]  WEIGHT SUMMARY AND BIOMETRICS  Weight Lost Since Last Visit: 0  No data recorded  Vitals Temp: 98.7 F (37.1 C) BP: 139/79 Pulse Rate: 82 SpO2: 97 %   Anthropometric Measurements Height: '5\' 5"'$  (1.651 m) Weight: 299 lb (135.6 kg) BMI (Calculated): 49.76 Weight at Last Visit: 299 lb Weight Lost Since Last Visit: 0 Starting Weight: 299 lb   Body Composition  Body Fat %: 55.6 % Fat Mass (lbs): 166.6 lbs Muscle Mass (lbs): 126.4 lbs Total Body Water (lbs): 97.4 lbs Visceral Fat Rating : 20   Other Clinical Data Fasting: no Labs: no Today's Visit #: 3 Starting Date: 05/19/22     HPI  Chief Complaint: OBESITY  Caylin is here to discuss her progress with her obesity treatment plan. She is on the the Category 4 Plan and states she is following her eating plan approximately 75 % of the time. She states she is exercising 0 minutes 0 days per week.   Interval History:  Since last office visit she has not been able to obtain Trulicity over the past week. Trulicity was just increased to 1.5 mg weekly. She has been trying to decrease simple carbohydrates, notes is drinking very little Pepsi's now and trying to grocery shop and plan meals .  She reports hunger and appetite are increased off of Trulicity. We were able to find the 1.5 mg at the [facility] today and she is going to see if she can get her prescription moved and start back on Trulicity today.  She has an appointment to see Endocrinology 08/05/2022.  Reports spinal stenosis with need for surgery is limiting her mobility. Cannot have surgery until A1c improves. Using rollator to walk in clinic.    PHYSICAL EXAM:  Blood pressure 139/79, pulse 82, temperature 98.7 F (37.1 C), height '5\' 5"'$  (1.651 m), weight 299 lb (135.6 kg), SpO2 97 %. Body mass index is 49.76 kg/m.  General: She is overweight, cooperative, alert, well developed, and in no acute distress. PSYCH:  Has normal mood, affect and thought process.   Extremities: No edema.  Neurologic: No gross sensory or motor deficits. No tremors or fasciculations noted.    DIAGNOSTIC DATA REVIEWED:  BMET    Component Value Date/Time   NA 136 05/27/2022 1524   K 4.7 05/27/2022 1524   CL 98 05/27/2022 1524   CO2 28 05/27/2022 1524   GLUCOSE 263 (H) 05/27/2022 1524   BUN 11 05/27/2022 1524   CREATININE 0.69 05/27/2022 1524   CALCIUM 9.4 05/27/2022 1524   GFRNONAA >60 04/06/2019 1242   GFRAA >60 04/06/2019 1242   Lab Results  Component Value Date   HGBA1C 12.3 (H) 05/27/2022   HGBA1C 6.6 (H) 02/21/2016   Lab Results  Component Value Date   INSULIN 79.5 (H) 05/19/2022   Lab Results  Component Value Date   TSH 1.94 05/27/2022   CBC    Component Value Date/Time   WBC 11.8 (H) 05/27/2022 1524   RBC 5.24 (H) 05/27/2022 1524   HGB 13.5 05/27/2022 1524   HCT 42.2 05/27/2022 1524   PLT 291.0 05/27/2022 1524   MCV 80.4 05/27/2022 1524   MCH 25.7 (L) 04/06/2019 1242   MCHC 32.1 05/27/2022 1524   RDW 15.8 (H) 05/27/2022 1524   Iron Studies No results found for: "IRON", "TIBC", "FERRITIN", "IRONPCTSAT" Lipid Panel     Component Value Date/Time   CHOL 105 12/24/2021 1052   TRIG 119.0 12/24/2021  1052   HDL 38.60 (L) 12/24/2021 1052   CHOLHDL 3 12/24/2021 1052   VLDL 23.8 12/24/2021 1052   LDLCALC 42 12/24/2021 1052   Hepatic Function Panel     Component Value Date/Time   PROT 7.9 05/27/2022 1524   ALBUMIN 3.7 05/27/2022 1524   AST 29 05/27/2022 1524   ALT 31 05/27/2022 1524   ALKPHOS 122 (H) 05/27/2022 1524   BILITOT 0.5 05/27/2022 1524      Component Value Date/Time   TSH 1.94 05/27/2022 1524   Nutritional Lab Results  Component Value Date   VD25OH 4.7 (L) 05/19/2022     ASSESSMENT AND PLAN  TREATMENT PLAN FOR OBESITY:  Recommended Dietary Goals  Kaitlee is currently in the action stage of change. As such, her goal is to continue weight management plan. She has  agreed to the Category 4 Plan.  Behavioral Intervention  We discussed the following Behavioral Modification Strategies today: increasing lean protein intake, decreasing simple carbohydrates , increasing water intake, and work on meal planning and easy cooking plans.  Additional resources provided today: NA  Recommended Physical Activity Goals  Briarrose has been advised to work up to 150 minutes of moderate intensity aerobic activity a week and strengthening exercises 2-3 times per week for cardiovascular health, weight loss maintenance and preservation of muscle mass.   She has agreed to increase physical activity in their day and reduce sedentary time (increase NEAT).    Pharmacotherapy We discussed various medication options to help Dekyra with her weight loss efforts and we both agreed to continue Trulicity per her PCP and we were able to locate Trulicity 1.5 mg dose at the [facility] today.  ASSOCIATED CONDITIONS ADDRESSED TODAY  Action/Plan  Type 2 diabetes mellitus with other specified complication, without long-term current use of insulin (HCC)  Vitamin D deficiency  Obesity ([place])- Start BMI- 50.26  BMI 45.0-49.9, adult (HCC) Current BMI 49.9  Type 2 Diabetes Mellitus with other specified complication, without long-term current use of insulin HgbA1c is not at goal. Last A1c was 12.3  Episodes of hypoglycemia: no Medication(s):  Trulicity 1.5 mg SQ weekly  Lab Results  Component Value Date   HGBA1C 12.3 (H) 05/27/2022   HGBA1C 13.1 (H) 02/26/2022   HGBA1C 13.8 (H) 11/19/2021   Lab Results  Component Value Date   MICROALBUR 7.9 (H) 02/26/2022   LDLCALC 42 12/24/2021   CREATININE 0.69 05/27/2022   Lab Results  Component Value Date   GFR 103.31 05/27/2022   GFR 104.54 03/26/2022   GFR 101.39 02/26/2022    Plan: Continue Trulicity 1.5 mg SQ weekly    Lab Results  Component Value Date   HGBA1C 12.3 (H) 05/27/2022   HGBA1C 13.1 (H) 02/26/2022   HGBA1C  13.8 (H) 11/19/2021   Lab Results  Component Value Date   MICROALBUR 7.9 (H) 02/26/2022   LDLCALC 42 12/24/2021   CREATININE 0.69 05/27/2022     Vitamin D Deficiency Vitamin D is not at goal of 50.  Most recent vitamin D level was 4.7. She is on  prescription ergocalciferol 50,000 IU twice weekly. Lab Results  Component Value Date   VD25OH 4.7 (L) 05/19/2022    Plan: Continue prescription vitamin D 50,000 IU twice weekly.   Return in about 2 weeks (around 07/06/2022).[name] She was informed of the importance of frequent follow up visits to maximize her success with intensive lifestyle modifications for her multiple health conditions.   ATTESTASTION STATEMENTS:  Reviewed by clinician on day of  visit: allergies, medications, problem list, medical history, surgical history, family history, social history, and previous encounter notes.   Time spent on visit including pre-visit chart review and post-visit care and charting was 35 minutes.    Kelon Easom,PA-C

## 2022-07-05 ENCOUNTER — Other Ambulatory Visit (HOSPITAL_BASED_OUTPATIENT_CLINIC_OR_DEPARTMENT_OTHER): Payer: Self-pay

## 2022-07-06 ENCOUNTER — Encounter (INDEPENDENT_AMBULATORY_CARE_PROVIDER_SITE_OTHER): Payer: Self-pay | Admitting: Physician Assistant

## 2022-07-06 ENCOUNTER — Ambulatory Visit (INDEPENDENT_AMBULATORY_CARE_PROVIDER_SITE_OTHER): Payer: Commercial Managed Care - HMO | Admitting: Physician Assistant

## 2022-07-06 VITALS — BP 143/81 | HR 82 | Temp 98.1°F | Ht 65.0 in | Wt 300.0 lb

## 2022-07-06 DIAGNOSIS — Z6841 Body Mass Index (BMI) 40.0 and over, adult: Secondary | ICD-10-CM

## 2022-07-06 DIAGNOSIS — Z7985 Long-term (current) use of injectable non-insulin antidiabetic drugs: Secondary | ICD-10-CM

## 2022-07-06 DIAGNOSIS — E559 Vitamin D deficiency, unspecified: Secondary | ICD-10-CM

## 2022-07-06 DIAGNOSIS — Z7984 Long term (current) use of oral hypoglycemic drugs: Secondary | ICD-10-CM

## 2022-07-06 DIAGNOSIS — E1169 Type 2 diabetes mellitus with other specified complication: Secondary | ICD-10-CM | POA: Diagnosis not present

## 2022-07-06 NOTE — Assessment & Plan Note (Signed)
Type 2 Diabetes Mellitus with other specified complication, without long-term current use of insulin HgbA1c is not at goal. Last A1c was 12.3  Medication(s): Trulicity 1.5 mg SQ weekly- no side effects   Farxiga 10 mg daily- no side effects    Glipizide XL 5 mg daily- no side effects   Metformin 1000 mg twice daily with food- no side effects.   Lab Results  Component Value Date   HGBA1C 12.3 (H) 05/27/2022   HGBA1C 13.1 (H) 02/26/2022   HGBA1C 13.8 (H) 11/19/2021   Lab Results  Component Value Date   MICROALBUR 7.9 (H) 02/26/2022   LDLCALC 42 12/24/2021   CREATININE 0.69 05/27/2022   Lab Results  Component Value Date   GFR 103.31 05/27/2022   GFR 104.54 03/26/2022   GFR 101.39 02/26/2022    Plan: Continue Trulicity 1.5 mg SQ weekly, Farxiga, metformin and glipizide.  Seeing Endocrinology in April and hopefully A1C will be significantly improved.  Continue working on nutrition plan to decrease simple carbohydrates, increase lean proteins and exercise to promote weight loss, and improve glycemic control.

## 2022-07-06 NOTE — Assessment & Plan Note (Signed)
Vitamin D Deficiency Vitamin D is not at goal of 50.  Most recent vitamin D level was 4.7. She is on  prescription ergocalciferol 50,000 IU twice weekly. Lab Results  Component Value Date   VD25OH 4.7 (L) 05/19/2022    Plan: Continue prescription vitamin D 50,000 IU weekly. Reports does not need refill yet. Recheck level in 2-3 months.

## 2022-07-06 NOTE — Progress Notes (Signed)
Office: [phone]  /  Fax: [phone]  WEIGHT SUMMARY AND BIOMETRICS  Vitals Temp: 98.1 F (36.7 C) BP: (!) 143/81 Pulse Rate: 82 SpO2: 95 %   Anthropometric Measurements Height: 5\' 5"  (1.651 m) Weight: 300 lb (136.1 kg) BMI (Calculated): 49.92 Weight at Last Visit: 299 lb Weight Lost Since Last Visit: 0 lb Weight Gained Since Last Visit: 1 lb Starting Weight: 299 lb Total Weight Loss (lbs): 0 lb (0 kg)   Body Composition  Body Fat %: 55.5 % Fat Mass (lbs): 166.6 lbs Muscle Mass (lbs): 126.6 lbs Total Body Water (lbs): 97.2 lbs Visceral Fat Rating : 20   Other Clinical Data Fasting: No Labs: No Today's Visit #: 4 Starting Date: 05/19/22     HPI  Chief Complaint: OBESITY  Aleysa is here to discuss her progress with her obesity treatment plan. She is on the the Category 4 Plan and states she is following her eating plan approximately 80 % of the time. She states she is exercising 0 minutes 0 times per week.   Interval History:  Since last office visit she obtained Trulicity 1.5 mg from [facility] and will take first dose tomorrow.  Reports decreased stress as her disability has finally come through and she will be receiving some back income as well which will decrease her financial stressors.  She is interested in pool therapy and plans to contact [facility] for information for programs.  She is wanting to get out more when the weather warms with spring.  Sleep in interrupted by pain at times.   She is scheduled for sleep medicine 07/26/22 Scheduled for new patient Endocrinology 08/05/22  Goal of Hgb A1C < 7 in order for surgery for spinal stenosis. Activity very limited due to back pain.   Uses instacart for grocery shopping and we discussed strategies to avoid choosing unhealthy snacks/beverages, like eating a healthy high protein meal prior to placing order.   Pharmacotherapy: Trulicity 1.5 mg weekly to start 07/07/22.  Insurance would not continue  to cover Ozempic in late 2023  PHYSICAL EXAM:  Blood pressure (!) 143/81, pulse 82, temperature 98.1 F (36.7 C), height 5\' 5"  (1.651 m), weight 300 lb (136.1 kg), SpO2 95 %. Body mass index is 49.92 kg/m.  General: She is overweight, cooperative, alert, well developed, and in no acute distress. Uses rollator to walk as is afraid of falling. Cushingoid appearance.  PSYCH: Has normal mood, affect and thought process.   Lungs: Normal breathing effort, no conversational dyspnea.  DIAGNOSTIC DATA REVIEWED:  BMET    Component Value Date/Time   NA 136 05/27/2022 1524   K 4.7 05/27/2022 1524   CL 98 05/27/2022 1524   CO2 28 05/27/2022 1524   GLUCOSE 263 (H) 05/27/2022 1524   BUN 11 05/27/2022 1524   CREATININE 0.69 05/27/2022 1524   CALCIUM 9.4 05/27/2022 1524   GFRNONAA >60 04/06/2019 1242   GFRAA >60 04/06/2019 1242   Lab Results  Component Value Date   HGBA1C 12.3 (H) 05/27/2022   HGBA1C 6.6 (H) 02/21/2016   Lab Results  Component Value Date   INSULIN 79.5 (H) 05/19/2022   Lab Results  Component Value Date   TSH 1.94 05/27/2022   CBC    Component Value Date/Time   WBC 11.8 (H) 05/27/2022 1524   RBC 5.24 (H) 05/27/2022 1524   HGB 13.5 05/27/2022 1524   HCT 42.2 05/27/2022 1524   PLT 291.0 05/27/2022 1524   MCV 80.4 05/27/2022 1524   MCH  25.7 (L) 04/06/2019 1242   MCHC 32.1 05/27/2022 1524   RDW 15.8 (H) 05/27/2022 1524   Iron Studies No results found for: "IRON", "TIBC", "FERRITIN", "IRONPCTSAT" Lipid Panel     Component Value Date/Time   CHOL 105 12/24/2021 1052   TRIG 119.0 12/24/2021 1052   HDL 38.60 (L) 12/24/2021 1052   CHOLHDL 3 12/24/2021 1052   VLDL 23.8 12/24/2021 1052   LDLCALC 42 12/24/2021 1052   Hepatic Function Panel     Component Value Date/Time   PROT 7.9 05/27/2022 1524   ALBUMIN 3.7 05/27/2022 1524   AST 29 05/27/2022 1524   ALT 31 05/27/2022 1524   ALKPHOS 122 (H) 05/27/2022 1524   BILITOT 0.5 05/27/2022 1524      Component  Value Date/Time   TSH 1.94 05/27/2022 1524   Nutritional Lab Results  Component Value Date   VD25OH 4.7 (L) 05/19/2022    ASSOCIATED CONDITIONS ADDRESSED TODAY  ASSESSMENT AND PLAN  Problem List Items Addressed This Visit     Morbid obesity ([place])   Type 2 diabetes mellitus with other specified complication ([place]) - Primary    Type 2 Diabetes Mellitus with other specified complication, without long-term current use of insulin HgbA1c is not at goal. Last A1c was 12.3  Medication(s): Trulicity 1.5 mg SQ weekly- no side effects   Farxiga 10 mg daily- no side effects    Glipizide XL 5 mg daily- no side effects   Metformin 1000 mg twice daily with food- no side effects.   Lab Results  Component Value Date   HGBA1C 12.3 (H) 05/27/2022   HGBA1C 13.1 (H) 02/26/2022   HGBA1C 13.8 (H) 11/19/2021   Lab Results  Component Value Date   MICROALBUR 7.9 (H) 02/26/2022   LDLCALC 42 12/24/2021   CREATININE 0.69 05/27/2022   Lab Results  Component Value Date   GFR 103.31 05/27/2022   GFR 104.54 03/26/2022   GFR 101.39 02/26/2022   Plan: Continue Trulicity 1.5 mg SQ weekly, Farxiga, metformin and glipizide.  Seeing Endocrinology in April and hopefully A1C will be significantly improved.  Continue working on nutrition plan to decrease simple carbohydrates, increase lean proteins and exercise to promote weight loss, and improve glycemic control.        Vitamin D deficiency    Vitamin D Deficiency Vitamin D is not at goal of 50.  Most recent vitamin D level was 4.7. She is on  prescription ergocalciferol 50,000 IU twice weekly. Lab Results  Component Value Date   VD25OH 4.7 (L) 05/19/2022   Plan: Continue prescription vitamin D 50,000 IU weekly. Reports does not need refill yet. Recheck level in 2-3 months.        BMI 45.0-49.9, adult (HCC) Current BMI 49.9      TREATMENT PLAN FOR OBESITY:  Recommended Dietary Goals  Shanikka is currently in the action stage of change. As  such, her goal is to continue weight management plan. She has agreed to the Category 4 Plan.  Behavioral Intervention  We discussed the following Behavioral Modification Strategies today: increasing lean protein intake, decreasing simple carbohydrates , increasing vegetables, increasing water intake, identifying sources and decreasing liquid calories, avoiding temptations and identifying enticing environmental cues, and ways to avoid boredom eating.  Additional resources provided today: NA  Recommended Physical Activity Goals  Soua has been advised to work up to 150 minutes of moderate intensity aerobic activity a week and strengthening exercises 2-3 times per week for cardiovascular health, weight loss maintenance and preservation  of muscle mass.   She has agreed to Continue current level of physical activity    Pharmacotherapy We discussed various medication options to help Ashna with her weight loss efforts and we both agreed to continue Trulicity for Type 2 diabetes.    Return in about 2 weeks (around 07/20/2022).[name] She was informed of the importance of frequent follow up visits to maximize her success with intensive lifestyle modifications for her multiple health conditions.   ATTESTASTION STATEMENTS:  Reviewed by clinician on day of visit: allergies, medications, problem list, medical history, surgical history, family history, social history, and previous encounter notes.   I have personally spent 42 minutes total time today in preparation, patient care, nutritional counseling and documentation for this visit, including the following: review of clinical lab tests; review of medical tests/procedures/services.      Kaylee Wombles, PA-C

## 2022-07-08 ENCOUNTER — Encounter: Payer: Self-pay | Admitting: Nurse Practitioner

## 2022-07-09 LAB — LAB REPORT - SCANNED: EGFR: 108

## 2022-07-20 ENCOUNTER — Other Ambulatory Visit: Payer: Self-pay | Admitting: Family Medicine

## 2022-07-20 ENCOUNTER — Ambulatory Visit (INDEPENDENT_AMBULATORY_CARE_PROVIDER_SITE_OTHER): Payer: Commercial Managed Care - HMO | Admitting: Physician Assistant

## 2022-07-20 ENCOUNTER — Other Ambulatory Visit (HOSPITAL_BASED_OUTPATIENT_CLINIC_OR_DEPARTMENT_OTHER): Payer: Self-pay

## 2022-07-20 ENCOUNTER — Encounter (INDEPENDENT_AMBULATORY_CARE_PROVIDER_SITE_OTHER): Payer: Self-pay | Admitting: Physician Assistant

## 2022-07-20 VITALS — BP 161/99 | HR 86 | Temp 98.4°F | Ht 65.0 in | Wt 299.0 lb

## 2022-07-20 DIAGNOSIS — Z6841 Body Mass Index (BMI) 40.0 and over, adult: Secondary | ICD-10-CM | POA: Diagnosis not present

## 2022-07-20 DIAGNOSIS — Z7984 Long term (current) use of oral hypoglycemic drugs: Secondary | ICD-10-CM

## 2022-07-20 DIAGNOSIS — E559 Vitamin D deficiency, unspecified: Secondary | ICD-10-CM

## 2022-07-20 DIAGNOSIS — E1169 Type 2 diabetes mellitus with other specified complication: Secondary | ICD-10-CM

## 2022-07-20 DIAGNOSIS — Z7985 Long-term (current) use of injectable non-insulin antidiabetic drugs: Secondary | ICD-10-CM

## 2022-07-20 DIAGNOSIS — E1165 Type 2 diabetes mellitus with hyperglycemia: Secondary | ICD-10-CM

## 2022-07-20 MED ORDER — VITAMIN D (ERGOCALCIFEROL) 1.25 MG (50000 UNIT) PO CAPS: 50000.0000 [IU] | ORAL_CAPSULE | ORAL | 0 refills | Status: DC

## 2022-07-20 MED ORDER — TRULICITY 1.5 MG/0.5ML ~~LOC~~ SOAJ
1.5000 mg | SUBCUTANEOUS | 0 refills | Status: DC
  Filled 2022-07-20: qty 6, 84d supply, fill #0
  Filled 2022-08-03: qty 2, 28d supply, fill #0

## 2022-07-20 NOTE — Assessment & Plan Note (Addendum)
Vitamin D Deficiency Vitamin D is not at goal of 50.  Most recent vitamin D level was 4.7. She is on  prescription ergocalciferol 50,000 IU twice weekly. Lab Results  Component Value Date   VD25OH 4.7 (L) 05/19/2022    Plan: [name] [name] prescription vitamin D 50,000 IU twice weekly.

## 2022-07-20 NOTE — Progress Notes (Signed)
Office: [phone]  /  Fax: [phone]  WEIGHT SUMMARY AND BIOMETRICS  Vitals Temp: 98.4 F (36.9 C) BP: (!) 161/99 Pulse Rate: 86 SpO2: 96 %   Anthropometric Measurements Height: 5\' 5"  (1.651 m) Weight: 299 lb (135.6 kg) BMI (Calculated): 49.76 Weight at Last Visit: 300 lb Weight Lost Since Last Visit: 1 lb Weight Gained Since Last Visit: 0 lb Starting Weight: 299 lb Total Weight Loss (lbs): 0 lb (0 kg)   Body Composition  Body Fat %: 39.4 % Fat Mass (lbs): 118.2 lbs Muscle Mass (lbs): 172.6 lbs Total Body Water (lbs): 101.6 lbs Visceral Fat Rating : 14   Other Clinical Data Fasting: No Labs: No Today's Visit #: 5 Starting Date: 05/19/22     HPI  Chief Complaint: OBESITY  April Gonzales is here to discuss her progress with her obesity treatment plan. She is on the the Category 4 Plan and states she is following her eating plan approximately 75 % of the time. She states she is exercising 0 minutes 0 times per week.   Interval History:  Since last office visit she is down 1 lb.  She is using instacart for shopping due to problems with left hip/leg pain and unable to walk much.  Trying to decrease snacking. Drinking 1 Pepsi daily for caffeine as reports gets headache if does not drink regular Pepsi. Discussed trying to decrease to 1/2 Pepsi at most.  Brkfast- 3 Boiled eggs, toast and yogurt Lunch- [place] burger chips or popcorn Dinner- Chili beans, [place], tomatoes.  Eating out some-discussed trying to meal plan/prep/cook at home.  She is scheduled for sleep medicine 07/26/22 Scheduled for new patient Endocrinology 08/05/22   Goal of Hgb A1C < 7 in order for surgery for spinal stenosis. Activity very limited due to back pain.   Pharmacotherapy: Trulicity 1.5 mg weekly for Type 2 diabetes. No side effects with Trulicity.  Previously on Ozempic, but lost coverage in late 2023.   PHYSICAL EXAM:  Blood pressure (!) 161/99, pulse 86, temperature 98.4 F (36.9  C), height 5\' 5"  (1.651 m), weight 299 lb (135.6 kg), SpO2 96 %. Body mass index is 49.76 kg/m.  General: She is overweight, cooperative, alert, well developed, and in no acute distress.  Moon facies/cushingoid appearance.  PSYCH: Has normal mood, affect and thought process.   Cardiovascular: regular rhythm Lungs: Normal breathing effort, no conversational dyspnea. Walks with rolling walker.   DIAGNOSTIC DATA REVIEWED:  BMET    Component Value Date/Time   NA 136 05/27/2022 1524   K 4.7 05/27/2022 1524   CL 98 05/27/2022 1524   CO2 28 05/27/2022 1524   GLUCOSE 263 (H) 05/27/2022 1524   BUN 11 05/27/2022 1524   CREATININE 0.69 05/27/2022 1524   CALCIUM 9.4 05/27/2022 1524   GFRNONAA >60 04/06/2019 1242   GFRAA >60 04/06/2019 1242   Lab Results  Component Value Date   HGBA1C 12.3 (H) 05/27/2022   HGBA1C 6.6 (H) 02/21/2016   Lab Results  Component Value Date   INSULIN 79.5 (H) 05/19/2022   Lab Results  Component Value Date   TSH 1.94 05/27/2022   CBC    Component Value Date/Time   WBC 11.8 (H) 05/27/2022 1524   RBC 5.24 (H) 05/27/2022 1524   HGB 13.5 05/27/2022 1524   HCT 42.2 05/27/2022 1524   PLT 291.0 05/27/2022 1524   MCV 80.4 05/27/2022 1524   MCH 25.7 (L) 04/06/2019 1242   MCHC 32.1 05/27/2022 1524   RDW 15.8 (H) 05/27/2022  1524   Iron Studies No results found for: "IRON", "TIBC", "FERRITIN", "IRONPCTSAT" Lipid Panel     Component Value Date/Time   CHOL 105 12/24/2021 1052   TRIG 119.0 12/24/2021 1052   HDL 38.60 (L) 12/24/2021 1052   CHOLHDL 3 12/24/2021 1052   VLDL 23.8 12/24/2021 1052   LDLCALC 42 12/24/2021 1052   Hepatic Function Panel     Component Value Date/Time   PROT 7.9 05/27/2022 1524   ALBUMIN 3.7 05/27/2022 1524   AST 29 05/27/2022 1524   ALT 31 05/27/2022 1524   ALKPHOS 122 (H) 05/27/2022 1524   BILITOT 0.5 05/27/2022 1524      Component Value Date/Time   TSH 1.94 05/27/2022 1524   Nutritional Lab Results  Component  Value Date   VD25OH 4.7 (L) 05/19/2022    ASSOCIATED CONDITIONS ADDRESSED TODAY  ASSESSMENT AND PLAN  Problem List Items Addressed This Visit     Morbid obesity   Relevant Medications   Dulaglutide (TRULICITY) 1.5 0000000 SOPN   Type 2 diabetes mellitus with other specified complication - Primary    Type 2 Diabetes Mellitus with other specified complication, without long-term current use of insulin HgbA1c is at goal. Last A1c was 12.3 CBGs: Fasting low 200's at times Episodes of hypoglycemia: no Medication(s): Trulicity 1.5 mg SQ weekly- no side effects                         Farxiga 10 mg daily- no side effects                            Glipizide XL 5 mg daily- no side effects                         Metformin 1000 mg twice daily with food- no side effects.   Lab Results  Component Value Date   HGBA1C 12.3 (H) 05/27/2022   HGBA1C 13.1 (H) 02/26/2022   HGBA1C 13.8 (H) 11/19/2021   Lab Results  Component Value Date   MICROALBUR 7.9 (H) 02/26/2022   LDLCALC 42 12/24/2021   CREATININE 0.69 05/27/2022   Lab Results  Component Value Date   GFR 103.31 05/27/2022   GFR 104.54 03/26/2022   GFR 101.39 02/26/2022   Plan: Continue and refill Trulicity 1.5 mg SQ weekly Continue :        Farxiga 10 mg daily-                         Glipizide XL 5 mg daily                         Metformin 1000 mg twice daily  Continue to work on nutrition plan, increase compliance with plan to promote weight loss and improve glycemic control. She has new patient evaluation with Endocrinology 08/05/22      Relevant Medications   Dulaglutide (TRULICITY) 1.5 0000000 SOPN   Vitamin D deficiency    Vitamin D Deficiency Vitamin D is not at goal of 50.  Most recent vitamin D level was 4.7. She is on  prescription ergocalciferol 50,000 IU twice weekly. Lab Results  Component Value Date   VD25OH 4.7 (L) 05/19/2022   Plan: April Gonzales [name] prescription vitamin D 50,000 IU twice weekly.        Relevant Medications   Vitamin D, Ergocalciferol, (  DRISDOL) 1.25 MG (50000 UNIT) CAPS capsule (Start on 07/22/2022)   BMI 45.0-49.9, adult (HCC) Current BMI 49.9   Relevant Medications   Dulaglutide (TRULICITY) 1.5 0000000 SOPN      TREATMENT PLAN FOR OBESITY:  Recommended Dietary Goals  April Gonzales is currently in the action stage of change. As such, her goal is to continue weight management plan. She has agreed to the Category 4 Plan.  Behavioral Intervention  We discussed the following Behavioral Modification Strategies today: increasing lean protein intake, decreasing simple carbohydrates , increasing water intake, work on meal planning and preparation, reading food labels , identifying sources and decreasing liquid calories, continue to work on implementation of reduced calorie nutritional plan, planning for success, and keeping healthy foods at home.  Additional resources provided today: NA  Recommended Physical Activity Goals  Miche has been advised to work up to 150 minutes of moderate intensity aerobic activity a week and strengthening exercises 2-3 times per week for cardiovascular health, weight loss maintenance and preservation of muscle mass.   She has agreed to Continue current level of physical activity    Pharmacotherapy We discussed various medication options to help Tanesa with her weight loss efforts and we both agreed to continue Trulicity 1.5 mg weekly for Type 2 diabetes.    Return in about 2 weeks (around 08/03/2022).[name] She was informed of the importance of frequent follow up visits to maximize her success with intensive lifestyle modifications for her multiple health conditions.   ATTESTASTION STATEMENTS:  Reviewed by clinician on day of visit: allergies, medications, problem list, medical history, surgical history, family history, social history, and previous encounter notes.   I have personally spent 36 minutes total time today in preparation, patient care,  nutritional counseling and documentation for this visit, including the following: review of clinical lab tests; review of medical tests/procedures/services.      Denyla Cortese, PA-C

## 2022-07-20 NOTE — Assessment & Plan Note (Addendum)
Type 2 Diabetes Mellitus with other specified complication, without long-term current use of insulin HgbA1c is at goal. Last A1c was 12.3 CBGs: Fasting low 200's at times Episodes of hypoglycemia: no Medication(s): Trulicity 1.5 mg SQ weekly- no side effects                         Farxiga 10 mg daily- no side effects                            Glipizide XL 5 mg daily- no side effects                         Metformin 1000 mg twice daily with food- no side effects.   Lab Results  Component Value Date   HGBA1C 12.3 (H) 05/27/2022   HGBA1C 13.1 (H) 02/26/2022   HGBA1C 13.8 (H) 11/19/2021   Lab Results  Component Value Date   MICROALBUR 7.9 (H) 02/26/2022   LDLCALC 42 12/24/2021   CREATININE 0.69 05/27/2022   Lab Results  Component Value Date   GFR 103.31 05/27/2022   GFR 104.54 03/26/2022   GFR 101.39 02/26/2022    Plan: Continue and refill Trulicity 1.5 mg SQ weekly Continue :        Farxiga 10 mg daily-                         Glipizide XL 5 mg daily                         Metformin 1000 mg twice daily  Continue to work on nutrition plan, increase compliance with plan to promote weight loss and improve glycemic control. She has new patient evaluation with Endocrinology 08/05/22

## 2022-07-26 ENCOUNTER — Institutional Professional Consult (permissible substitution): Payer: Commercial Managed Care - HMO | Admitting: Neurology

## 2022-07-27 ENCOUNTER — Other Ambulatory Visit: Payer: Self-pay

## 2022-07-27 DIAGNOSIS — E1165 Type 2 diabetes mellitus with hyperglycemia: Secondary | ICD-10-CM

## 2022-07-28 ENCOUNTER — Other Ambulatory Visit: Payer: Self-pay | Admitting: Family Medicine

## 2022-07-28 DIAGNOSIS — E1165 Type 2 diabetes mellitus with hyperglycemia: Secondary | ICD-10-CM

## 2022-08-03 ENCOUNTER — Ambulatory Visit (INDEPENDENT_AMBULATORY_CARE_PROVIDER_SITE_OTHER): Payer: Commercial Managed Care - HMO | Admitting: Physician Assistant

## 2022-08-03 ENCOUNTER — Other Ambulatory Visit (HOSPITAL_BASED_OUTPATIENT_CLINIC_OR_DEPARTMENT_OTHER): Payer: Self-pay

## 2022-08-03 ENCOUNTER — Encounter (INDEPENDENT_AMBULATORY_CARE_PROVIDER_SITE_OTHER): Payer: Self-pay | Admitting: Physician Assistant

## 2022-08-03 VITALS — BP 142/83 | HR 86 | Temp 97.7°F | Ht 65.0 in | Wt 300.0 lb

## 2022-08-03 DIAGNOSIS — E1169 Type 2 diabetes mellitus with other specified complication: Secondary | ICD-10-CM

## 2022-08-03 DIAGNOSIS — E559 Vitamin D deficiency, unspecified: Secondary | ICD-10-CM

## 2022-08-03 DIAGNOSIS — Z7985 Long-term (current) use of injectable non-insulin antidiabetic drugs: Secondary | ICD-10-CM

## 2022-08-03 DIAGNOSIS — I10 Essential (primary) hypertension: Secondary | ICD-10-CM

## 2022-08-03 DIAGNOSIS — Z7984 Long term (current) use of oral hypoglycemic drugs: Secondary | ICD-10-CM

## 2022-08-03 DIAGNOSIS — G4730 Sleep apnea, unspecified: Secondary | ICD-10-CM | POA: Diagnosis not present

## 2022-08-03 DIAGNOSIS — Z6841 Body Mass Index (BMI) 40.0 and over, adult: Secondary | ICD-10-CM

## 2022-08-03 NOTE — Assessment & Plan Note (Signed)
Hypertension Hypertension no significant medication side effects noted, poorly controlled, needs further observation, and needs improvement.  Medication(s): Lisinopril 40 mg daily.   BP Readings from Last 3 Encounters:  08/03/22 (!) 142/83  07/20/22 (!) 161/99  07/06/22 (!) 143/81   Lab Results  Component Value Date   CREATININE 0.69 05/27/2022   CREATININE 0.66 03/26/2022   CREATININE 0.71 02/26/2022   Lab Results  Component Value Date   GFR 103.31 05/27/2022   GFR 104.54 03/26/2022   GFR 101.39 02/26/2022    Plan: Continue all antihypertensives at current dosages. Recent increases. Patient feels is related to being in hot apartment and Seeing Endocrinology over the next week .  Will plan to add medication if not improving.  Continue to work on nutrition plan to promote weight loss and improve BP control.

## 2022-08-03 NOTE — Patient Instructions (Signed)

## 2022-08-03 NOTE — Assessment & Plan Note (Signed)
Vitamin D Deficiency Vitamin D is not at goal of 50.  Most recent vitamin D level was 4.7. She is on  prescription ergocalciferol 50,000 IU twice weekly. Lab Results  Component Value Date   VD25OH 4.7 (L) 05/19/2022    Plan: Continue  prescription ergocalciferol 50,000 IU twice weekly Low vitamin D levels can be associated with adiposity and may result in leptin resistance and weight gain. Also associated with fatigue. Currently on vitamin D supplementation without any adverse effects.  Plan to recheck Vitamin D level over the next 1-2 months.

## 2022-08-03 NOTE — Progress Notes (Signed)
Office: [phone]  /  Fax: [phone]  WEIGHT SUMMARY AND BIOMETRICS  Vitals Temp: 97.7 F (36.5 C) BP: (!) 142/83 Pulse Rate: 86 SpO2: 98 %   Anthropometric Measurements Height:  (1.651 m) Weight: 300 lb (136.1 kg) BMI (Calculated): 49.92 Weight at Last Visit: 299 lb Weight Lost Since Last Visit: 0 lb Weight Gained Since Last Visit: 1 lb Starting Weight: 299 lb Total Weight Loss (lbs): 0 lb (0 kg)   Body Composition  Body Fat %: 36.4 % Fat Mass (lbs): 109.2 lbs Muscle Mass (lbs): 181.2 lbs Total Body Water (lbs): 105.4 lbs Visceral Fat Rating : 13   Other Clinical Data Fasting: no Labs: no Today's Visit #: 6 Starting Date: 05/19/22     HPI  Chief Complaint: OBESITY  April Gonzales is here to discuss her progress with her obesity treatment plan. She is on the the Category 4 Plan and states she is following her eating plan approximately 85 % of the time. She states she is exercising 0 minutes 0 times per week.   Interval History:  Since last office visit she is up 1 lb. Does well when has all groceries in home, but gets off track if doesn't have what she needs.  Discussed strategies, uses [employer] or other service to buy groceries.  Hunger/appetite variable, not always well controlled.   Sleep study was rescheduled to 08/16/22. Initial Endocrinology scheduled for later this week 08/05/22.   Dermatology follow up for psoriasis- Labs 07/08/22- Media- Glucose 367/ Alk phos 128/AST 36/ALT 36  Goal of Hgb A1C < 7 in order for surgery for spinal stenosis. Activity very limited due to back pain.   Pharmacotherapy: Trulicity 1.5 mg weekly. Some constipation recently. No other GI side effects. No neck mass or difficulty swallowing. Mood stable- sees Psychiatry every month.  Previously on Ozempic, but lost insurance coverage in 2023.  PHYSICAL EXAM:  Blood pressure (!) 142/83, pulse 86, temperature 97.7 F (36.5 C), height  (1.651 m), weight 300 lb  (136.1 kg), SpO2 98 %. Body mass index is 49.92 kg/m.  General: She is overweight, cooperative, alert, well developed, and in no acute distress. Cushingoid appearance. Walks with rolling walker.  PSYCH: Has normal mood, affect and thought process.   Cardiovascular: HR 80's regular rhythm Lungs: Normal breathing effort, no conversational dyspnea.  DIAGNOSTIC DATA REVIEWED:  BMET    Component Value Date/Time   NA 136 05/27/2022 1524   K 4.7 05/27/2022 1524   CL 98 05/27/2022 1524   CO2 28 05/27/2022 1524   GLUCOSE 263 (H) 05/27/2022 1524   BUN 11 05/27/2022 1524   CREATININE 0.69 05/27/2022 1524   CALCIUM 9.4 05/27/2022 1524   GFRNONAA >60 04/06/2019 1242   GFRAA >60 04/06/2019 1242   Lab Results  Component Value Date   HGBA1C 12.3 (H) 05/27/2022   HGBA1C 6.6 (H) 02/21/2016   Lab Results  Component Value Date   INSULIN 79.5 (H) 05/19/2022   Lab Results  Component Value Date   TSH 1.94 05/27/2022   CBC    Component Value Date/Time   WBC 11.8 (H) 05/27/2022 1524   RBC 5.24 (H) 05/27/2022 1524   HGB 13.5 05/27/2022 1524   HCT 42.2 05/27/2022 1524   PLT 291.0 05/27/2022 1524   MCV 80.4 05/27/2022 1524   MCH 25.7 (L) 04/06/2019 1242   MCHC 32.1 05/27/2022 1524   RDW 15.8 (H) 05/27/2022 1524   Iron Studies No results found for: "IRON", "TIBC", "FERRITIN", "IRONPCTSAT" Lipid Panel  Component Value Date/Time   CHOL 105 12/24/2021 1052   TRIG 119.0 12/24/2021 1052   HDL 38.60 (L) 12/24/2021 1052   CHOLHDL 3 12/24/2021 1052   VLDL 23.8 12/24/2021 1052   LDLCALC 42 12/24/2021 1052   Hepatic Function Panel     Component Value Date/Time   PROT 7.9 05/27/2022 1524   ALBUMIN 3.7 05/27/2022 1524   AST 29 05/27/2022 1524   ALT 31 05/27/2022 1524   ALKPHOS 122 (H) 05/27/2022 1524   BILITOT 0.5 05/27/2022 1524      Component Value Date/Time   TSH 1.94 05/27/2022 1524   Nutritional Lab Results  Component Value Date   VD25OH 4.7 (L) 05/19/2022     ASSOCIATED CONDITIONS ADDRESSED TODAY  ASSESSMENT AND PLAN  Problem List Items Addressed This Visit     Morbid obesity   Type 2 diabetes mellitus with other specified complication - Primary    Type 2 Diabetes Mellitus with other specified complication, without long-term current use of insulin HgbA1c is not at goal. Last A1c was 12.3 CBGs: Fasting 200-250 Episodes of hypoglycemia: no Medication(s): Trulicity 1.5 mg SQ weekly and metformin 1000 mg twice daily and glipizide 5 mg daily, Farxiga 10 mg daily.   Lab Results  Component Value Date   HGBA1C 12.3 (H) 05/27/2022   HGBA1C 13.1 (H) 02/26/2022   HGBA1C 13.8 (H) 11/19/2021   Lab Results  Component Value Date   MICROALBUR 7.9 (H) 02/26/2022   LDLCALC 42 12/24/2021   CREATININE 0.69 05/27/2022   Lab Results  Component Value Date   GFR 103.31 05/27/2022   GFR 104.54 03/26/2022   GFR 101.39 02/26/2022   Plan: Continue Trulicity 1.5 mg SQ weekly and Trulicity 1.5 mg SQ weekly and metformin 1000 mg twice daily and glipizide 5 mg daily, Farxiga 10 mg daily.  She has Endocrinology evaluation this week. Will hold off making other changes, but would favor Mounjaro for management as as made little progress on Trulicity with regards to improvement in A1C and promoting weight loss.  Will defer to Endocrinology.         Essential hypertension    Hypertension Hypertension no significant medication side effects noted, poorly controlled, needs further observation, and needs improvement.  Medication(s): Lisinopril 40 mg daily.   BP Readings from Last 3 Encounters:  08/03/22 (!) 142/83  07/20/22 (!) 161/99  07/06/22 (!) 143/81   Lab Results  Component Value Date   CREATININE 0.69 05/27/2022   CREATININE 0.66 03/26/2022   CREATININE 0.71 02/26/2022   Lab Results  Component Value Date   GFR 103.31 05/27/2022   GFR 104.54 03/26/2022   GFR 101.39 02/26/2022   Plan: Continue all antihypertensives at current dosages.  Recent increases. Patient feels is related to being in hot apartment and Seeing Endocrinology over the next week .  Will plan to add medication if not improving.  Continue to work on nutrition plan to promote weight loss and improve BP control.         Vitamin D deficiency    Vitamin D Deficiency Vitamin D is not at goal of 50.  Most recent vitamin D level was 4.7. She is on  prescription ergocalciferol 50,000 IU twice weekly. Lab Results  Component Value Date   VD25OH 4.7 (L) 05/19/2022   Plan: Continue  prescription ergocalciferol 50,000 IU twice weekly Low vitamin D levels can be associated with adiposity and may result in leptin resistance and weight gain. Also associated with fatigue. Currently on vitamin D supplementation  without any adverse effects.  Plan to recheck Vitamin D level over the next 1-2 months.         Sleep-disordered breathing    Reports disrupted , non restorative sleep patterns due to multiple issues. Problems with air conditioning in apartment and unable to sleep well recently.  Plan: She is going to sleep study 08/16/22.  Working with apartment complex to have air conditioning fixed.       BMI 45.0-49.9, adult (HCC) Current BMI 49.9      TREATMENT PLAN FOR OBESITY:  Recommended Dietary Goals  Ariane is currently in the action stage of change. As such, her goal is to continue weight management plan. She has agreed to the Category 4 Plan.  Behavioral Intervention  We discussed the following Behavioral Modification Strategies today: increasing lean protein intake, decreasing simple carbohydrates , increasing vegetables, increasing fiber rich foods, avoiding skipping meals, increasing water intake, work on meal planning and preparation, and emotional eating strategies and understanding the difference between hunger signals and cravings.  Additional resources provided today: NA  Recommended Physical Activity Goals  Lariah has been advised to work up  to 150 minutes of moderate intensity aerobic activity a week and strengthening exercises 2-3 times per week for cardiovascular health, weight loss maintenance and preservation of muscle mass.   She has agreed to Continue current level of physical activity    Pharmacotherapy We discussed various medication options to help Clarine with her weight loss efforts and we both agreed to continue Trulicity but we have also discussed Mounjaro as has not made much progress with Trulicity for Type 2 diabetes. She is seeing Endocrinology later this week and will defer changes in medications at this point.    Return in about 3 weeks (around 08/24/2022).[name] She was informed of the importance of frequent follow up visits to maximize her success with intensive lifestyle modifications for her multiple health conditions.   ATTESTASTION STATEMENTS:  Reviewed by clinician on day of visit: allergies, medications, problem list, medical history, surgical history, family history, social history, and previous encounter notes.   I have personally spent 48 minutes total time today in preparation, patient care, nutritional counseling and documentation for this visit, including the following: review of clinical lab tests; review of medical tests/procedures/services.      Jayveion Stalling, PA-C

## 2022-08-03 NOTE — Assessment & Plan Note (Signed)
Type 2 Diabetes Mellitus with other specified complication, without long-term current use of insulin HgbA1c is not at goal. Last A1c was 12.3 CBGs: Fasting 200-250 Episodes of hypoglycemia: no Medication(s): Trulicity 1.5 mg SQ weekly and metformin 1000 mg twice daily and glipizide 5 mg daily, Farxiga 10 mg daily.   Lab Results  Component Value Date   HGBA1C 12.3 (H) 05/27/2022   HGBA1C 13.1 (H) 02/26/2022   HGBA1C 13.8 (H) 11/19/2021   Lab Results  Component Value Date   MICROALBUR 7.9 (H) 02/26/2022   LDLCALC 42 12/24/2021   CREATININE 0.69 05/27/2022   Lab Results  Component Value Date   GFR 103.31 05/27/2022   GFR 104.54 03/26/2022   GFR 101.39 02/26/2022    Plan: Continue Trulicity 1.5 mg SQ weekly and Trulicity 1.5 mg SQ weekly and metformin 1000 mg twice daily and glipizide 5 mg daily, Farxiga 10 mg daily.  She has Endocrinology evaluation this week. Will hold off making other changes, but would favor Mounjaro for management as as made little progress on Trulicity with regards to improvement in A1C and promoting weight loss.  Will defer to Endocrinology.

## 2022-08-03 NOTE — Assessment & Plan Note (Signed)
Reports disrupted , non restorative sleep patterns due to multiple issues. Problems with air conditioning in apartment and unable to sleep well recently.  Plan: She is going to sleep study 08/16/22.  Working with apartment complex to have air conditioning fixed.

## 2022-08-05 ENCOUNTER — Encounter: Payer: Self-pay | Admitting: Nurse Practitioner

## 2022-08-05 ENCOUNTER — Ambulatory Visit: Payer: Commercial Managed Care - HMO | Admitting: Nurse Practitioner

## 2022-08-05 VITALS — BP 140/80 | HR 80 | Ht 65.0 in | Wt 302.4 lb

## 2022-08-05 DIAGNOSIS — E1165 Type 2 diabetes mellitus with hyperglycemia: Secondary | ICD-10-CM

## 2022-08-05 NOTE — Progress Notes (Signed)
Endocrinology Consult Note       08/05/2022, 1:47 PM   Subjective:    Patient ID: April Gonzales, female    DOB: 1976/02/04.  April Gonzales is being seen in consultation for management of currently uncontrolled symptomatic diabetes requested by  Avanell Shackleton, NP-C.   Past Medical History:  Diagnosis Date   Anemia    Anxiety    B12 deficiency    Back pain    Celiac disease    Chronic fatigue syndrome    Constipation    Depression    Gallbladder problem    Heartburn    Hypertension    Joint pain    Pancreatic disease    Psoriasis    SOBOE (shortness of breath on exertion)    Type 2 diabetes mellitus without complication, without long-term current use of insulin 11/19/2021   Vitamin D deficiency     Past Surgical History:  Procedure Laterality Date   CHOLECYSTECTOMY     WISDOM TOOTH EXTRACTION      Social History   Socioeconomic History   Marital status: Single    Spouse name: Not on file   Number of children: Not on file   Years of education: Not on file   Highest education level: Not on file  Occupational History   Occupation: Currently on leave of absence  Tobacco Use   Smoking status: Never   Smokeless tobacco: Never  Substance and Sexual Activity   Alcohol use: No   Drug use: No   Sexual activity: Not on file  Other Topics Concern   Not on file  Social History Narrative   Not on file   Social Determinants of Health   Financial Resource Strain: Not on file  Food Insecurity: Not on file  Transportation Needs: Not on file  Physical Activity: Not on file  Stress: Not on file  Social Connections: Not on file    Family History  Problem Relation Age of Onset   Hyperlipidemia Mother    Diabetes Mother    Diabetes Mellitus II Mother    Hypertension Mother    Obesity Mother    Obesity Father    Hypertension Father    CAD Father    Chronic Renal Failure Father     Hyperlipidemia Father    Heart disease Father    Kidney disease Father    Alcoholism Father     Outpatient Encounter Medications as of 08/05/2022  Medication Sig   busPIRone (BUSPAR) 5 MG tablet Take 5 mg by mouth 2 (two) times daily.   Dulaglutide (TRULICITY) 1.5 MG/0.5ML SOPN Inject 1.5 mg into the skin once a week.   escitalopram (LEXAPRO) 20 MG tablet Take 1 tablet (20 mg total) by mouth daily.   fluconazole (DIFLUCAN) 100 MG tablet Take 100 mg by mouth 3 (three) times a week.   glipiZIDE (GLUCOTROL XL) 5 MG 24 hr tablet TAKE 1 TABLET BY MOUTH EVERY DAY WITH BREAKFAST   hydrocortisone-pramoxine (PROCTOFOAM-HC) rectal foam Place 1 applicator rectally 2 (two) times daily.   lisinopril (ZESTRIL) 40 MG tablet Take 1 tablet (40 mg total) by mouth daily.   metFORMIN (GLUCOPHAGE) 1000 MG tablet TAKE 1  TABLET (1,000 MG TOTAL) BY MOUTH TWICE A DAY WITH FOOD   nystatin cream (MYCOSTATIN) Apply 1 Application topically 2 (two) times daily.   pregabalin (LYRICA) 75 MG capsule Take 75 mg by mouth as needed.   Risankizumab-rzaa (SKYRIZI) 150 MG/ML SOSY Inject into the skin.   Tapinarof (VTAMA) 1 % CREA 1 application Externally Once a day for 30 days   triamcinolone cream (KENALOG) 0.1 % Apply 1 application topically 2 (two) times daily as needed (psoriasis).    Urea 45 % CREA 1 application as needed Externally Once a day   Vitamin D, Ergocalciferol, (DRISDOL) 1.25 MG (50000 UNIT) CAPS capsule Take 1 capsule (50,000 Units total) by mouth 2 (two) times a week.   [DISCONTINUED] dapagliflozin propanediol (FARXIGA) 10 MG TABS tablet Take 1 tablet (10 mg total) by mouth daily before breakfast.   No facility-administered encounter medications on file as of 08/05/2022.    ALLERGIES: Allergies  Allergen Reactions   Dilaudid [Hydromorphone Hcl] Nausea And Vomiting   Hydromorphone     Other reaction(s): Unknown   Methotrexate Other (See Comments)    VACCINATION STATUS: Immunization History   Administered Date(s) Administered   Influenza,inj,Quad PF,6+ Mos 12/24/2021   Tdap 10/11/2020    Diabetes She presents for her initial diabetic visit. She has type 2 diabetes mellitus. Onset time: formally diagnosed at age 37. Her disease course has been fluctuating. There are no hypoglycemic associated symptoms. Associated symptoms include fatigue, polydipsia and polyuria. There are no hypoglycemic complications. Symptoms are stable. There are no diabetic complications. Risk factors for coronary artery disease include diabetes mellitus, dyslipidemia, obesity, hypertension and sedentary lifestyle. Current diabetic treatment includes oral agent (triple therapy) (and Trulicity). She is compliant with treatment most of the time. Her weight is stable. She is following a generally unhealthy diet. When asked about meal planning, she reported none. She has not had a previous visit with a dietitian. She rarely participates in exercise. Her overall blood glucose range is >200 mg/dl. (She presents today for her consultation with no meter or logs to review.  Her most recent A1c on 05/27/22 was 12.3%.  She monitors glucose once daily and has been getting readings ranging between 200-250.  She drinks mostly water, some soda (otherwise she will have caffeine withdrawal headache), and eats 3 meals per day with occasional snacks between.  She does not engage in routine physical activity but is looking into water aerobics.  She is due for eye exam, has never been seen by podiatry in the past.) An ACE inhibitor/angiotensin II receptor blocker is being taken. She does not see a podiatrist.Eye exam is not current.     Review of systems  Constitutional: + Minimally fluctuating body weight, current Body mass index is 50.32 kg/m., no fatigue, no subjective hyperthermia, no subjective hypothermia Eyes: no blurry vision, no xerophthalmia ENT: no sore throat, no nodules palpated in throat, no dysphagia/odynophagia, no  hoarseness Cardiovascular: no chest pain, no shortness of breath, no palpitations, no leg swelling Respiratory: no cough, no shortness of breath Gastrointestinal: no nausea/vomiting/diarrhea Musculoskeletal: no muscle/joint aches Skin: + dry flaky skin to BLE, + hyperemia to BLE Neurological: no tremors, no numbness, no tingling, no dizziness Psychiatric: no depression, no anxiety  Objective:     BP (!) 140/80 (BP Location: Left Arm, Patient Position: Sitting, Cuff Size: Large)   Pulse 80   Ht  (1.651 m)   Wt (!) 302 lb 6.4 oz (137.2 kg)   BMI 50.32 kg/m   Wt  Readings from Last 3 Encounters:  08/05/22 (!) 302 lb 6.4 oz (137.2 kg)  08/03/22 300 lb (136.1 kg)  07/20/22 299 lb (135.6 kg)     BP Readings from Last 3 Encounters:  08/05/22 (!) 140/80  08/03/22 (!) 142/83  07/20/22 (!) 161/99     Physical Exam- Limited  Constitutional:  Body mass index is 50.32 kg/m. , not in acute distress, normal state of mind Eyes:  EOMI, no exophthalmos Neck: Supple Cardiovascular: RRR, no murmurs, rubs, or gallops, no edema Respiratory: Adequate breathing efforts, no crackles, rales, rhonchi, or wheezing, diminished breath sounds bilateral bases Musculoskeletal: no gross deformities, strength intact in all four extremities, no gross restriction of joint movements Skin:  + dry flaky skin to BLE, + hyperemia to BLE Neurological: no tremor with outstretched hands   Diabetic Foot Exam - Simple   No data filed     CMP ( most recent) CMP     Component Value Date/Time   NA 136 05/27/2022 1524   K 4.7 05/27/2022 1524   CL 98 05/27/2022 1524   CO2 28 05/27/2022 1524   GLUCOSE 263 (H) 05/27/2022 1524   BUN 11 05/27/2022 1524   CREATININE 0.69 05/27/2022 1524   CALCIUM 9.4 05/27/2022 1524   PROT 7.9 05/27/2022 1524   ALBUMIN 3.7 05/27/2022 1524   AST 29 05/27/2022 1524   ALT 31 05/27/2022 1524   ALKPHOS 122 (H) 05/27/2022 1524   BILITOT 0.5 05/27/2022 1524   GFRNONAA >60  04/06/2019 1242   GFRAA >60 04/06/2019 1242     Diabetic Labs (most recent): Lab Results  Component Value Date   HGBA1C 12.3 (H) 05/27/2022   HGBA1C 13.1 (H) 02/26/2022   HGBA1C 13.8 (H) 11/19/2021   MICROALBUR 7.9 (H) 02/26/2022     Lipid Panel ( most recent) Lipid Panel     Component Value Date/Time   CHOL 105 12/24/2021 1052   TRIG 119.0 12/24/2021 1052   HDL 38.60 (L) 12/24/2021 1052   CHOLHDL 3 12/24/2021 1052   VLDL 23.8 12/24/2021 1052   LDLCALC 42 12/24/2021 1052      Lab Results  Component Value Date   TSH 1.94 05/27/2022   TSH 2.11 02/26/2022   TSH 1.95 11/19/2021   FREET4 1.10 11/19/2021           Assessment & Plan:   1) Type 2 diabetes mellitus with hyperglycemia, without long-term current use of insulin  She presents today for her consultation with no meter or logs to review.  Her most recent A1c on 05/27/22 was 12.3%.  She monitors glucose once daily and has been getting readings ranging between 200-250.  She drinks mostly water, some soda (otherwise she will have caffeine withdrawal headache), and eats 3 meals per day with occasional snacks between.  She does not engage in routine physical activity but is looking into water aerobics.  She is due for eye exam, has never been seen by podiatry in the past.  - April Gonzales has currently uncontrolled symptomatic type 2 DM since 47 years of age, with most recent A1c of 12.3 %.   -Recent labs reviewed.  - I had a long discussion with her about the progressive nature of diabetes and the pathology behind its complications. -her diabetes is complicated by pancreatitis (on 2 separate occasions) and she remains at a high risk for more acute and chronic complications which include CAD, CVA, CKD, retinopathy, and neuropathy. These are all discussed in detail with her.  The  following Lifestyle Medicine recommendations according to American College of Lifestyle Medicine [facility]) were discussed and offered to patient  and she agrees to start the journey:  A. Whole Foods, Plant-based plate comprising of fruits and vegetables, plant-based proteins, whole-grain carbohydrates was discussed in detail with the patient.   A list for source of those nutrients were also provided to the patient.  Patient will use only water or unsweetened tea for hydration. B.  The need to stay away from risky substances including alcohol, smoking; obtaining 7 to 9 hours of restorative sleep, at least 150 minutes of moderate intensity exercise weekly, the importance of healthy social connections,  and stress reduction techniques were discussed. C.  A full color page of  Calorie density of various food groups per pound showing examples of each food groups was provided to the patient.  - I have counseled her on diet and weight management by adopting a carbohydrate restricted/protein rich diet. Patient is encouraged to switch to unprocessed or minimally processed complex starch and increased protein intake (animal or plant source), fruits, and vegetables. -  she is advised to stick to a routine mealtimes to eat 3 meals a day and avoid unnecessary snacks (to snack only to correct hypoglycemia).   - she acknowledges that there is a room for improvement in her food and drink choices. - Suggestion is made for her to avoid simple carbohydrates from her diet including Cakes, Sweet Desserts, Ice Cream, Soda (diet and regular), Sweet Tea, April, Chips, Cookies, Store Bought Juices, Alcohol in Excess of 1-2 drinks a day, Artificial Sweeteners, Coffee Creamer, and "Sugar-free" Products. This will help patient to have more stable blood glucose profile and potentially avoid unintended weight gain.  - she will be scheduled with Norm Salt, RDN, CDE for diabetes education.  - I have approached her with the following individualized plan to manage her diabetes and patient agrees:   --she is encouraged to start monitoring glucose 2 times daily, before  breakfast and before bed, to log their readings on the clinic sheets provided, and bring them to review at follow up appointment in 4 weeks.  - Adjustment parameters are given to her for hypo and hyperglycemia in writing. - she is encouraged to call clinic for blood glucose levels less than 70 or above 300 mg /dl. - she is advised to continue Metformin 1000 mg po twice daily with meals and Glipizide 5 mg XL po daily, therapeutically suitable for patient . - her April Gonzales will be discontinued, risk outweighs benefit for this patient- has had yeast and UTI infections since starting this medication.  - she is not an ideal candidate for incretin therapy given her history of pancreatitis on 2 separate occasions in the past.  However, she has been on Trulicity 1.5 mg SQ weekly and tolerating it well, therefore she can continue it for now.  I did advise patient to stop this medication and call the clinic if she develops s/s of pancreatitis.  There is a chance she may need at least basal insulin in the future if she cannot gain control with lifestyle modifications and her current medications as she may have some pancreatic dysfunction.  - Specific targets for  A1c; LDL, HDL, and Triglycerides were discussed with the patient.  2) Blood Pressure /Hypertension:  her blood pressure is controlled to target.   she is advised to continue her current medications including Lisinopril 40 mg p.o. daily with breakfast.  3) Lipids/Hyperlipidemia:    Review of her recent  lipid panel from 12/24/21 showed controlled LDL at 42 . She is not currently on any lipid lowering medications.  4)  Weight/Diet:  her Body mass index is 50.32 kg/m.  -  clearly complicating her diabetes care.   she is a candidate for weight loss. I discussed with her the fact that loss of 5 - 10% of her  current body weight will have the most impact on her diabetes management.  Exercise, and detailed carbohydrates information provided  -  detailed on  discharge instructions.  5) Chronic Care/Health Maintenance: -she is on ACEI/ARB and not on Statin medications and is encouraged to initiate and continue to follow up with Ophthalmology, Dentist, Podiatrist at least yearly or according to recommendations, and advised to stay away from smoking. I have recommended yearly flu vaccine and pneumonia vaccine at least every 5 years; moderate intensity exercise for up to 150 minutes weekly; and sleep for at least 7 hours a day.  - she is advised to maintain close follow up with Avanell Shackleton, NP-C for primary care needs, as well as her other providers for optimal and coordinated care.   - Time spent in this patient care: 60 min, of which > 50% was spent in counseling her about her diabetes and the rest reviewing her blood glucose logs, discussing her hypoglycemia and hyperglycemia episodes, reviewing her current and previous labs/studies (including abstraction from other facilities) and medications doses and developing a long term treatment plan based on the latest standards of care/guidelines; and documenting her care.    Please refer to Patient Instructions for Blood Glucose Monitoring and Insulin/Medications Dosing Guide" in media tab for additional information. Please also refer to "Patient Self Inventory" in the Media tab for reviewed elements of pertinent patient history.  Harvel Ricks participated in the discussions, expressed understanding, and voiced agreement with the above plans.  All questions were answered to her satisfaction. she is encouraged to contact clinic should she have any questions or concerns prior to her return visit.     Follow up plan: - Return in about 4 weeks (around 09/02/2022) for Diabetes F/U with A1c in office, Bring meter and logs.    Ronny Bacon, [facility] [address] [place], [place] [address] Phone: [phone] Fax: [phone]  08/05/2022, 1:47 PM

## 2022-08-11 ENCOUNTER — Other Ambulatory Visit (INDEPENDENT_AMBULATORY_CARE_PROVIDER_SITE_OTHER): Payer: Self-pay | Admitting: Physician Assistant

## 2022-08-11 DIAGNOSIS — E559 Vitamin D deficiency, unspecified: Secondary | ICD-10-CM

## 2022-08-16 ENCOUNTER — Ambulatory Visit (INDEPENDENT_AMBULATORY_CARE_PROVIDER_SITE_OTHER): Payer: Commercial Managed Care - HMO | Admitting: Neurology

## 2022-08-16 ENCOUNTER — Encounter: Payer: Self-pay | Admitting: Neurology

## 2022-08-16 VITALS — BP 146/86 | HR 76 | Ht 65.0 in | Wt 305.0 lb

## 2022-08-16 DIAGNOSIS — Z9189 Other specified personal risk factors, not elsewhere classified: Secondary | ICD-10-CM

## 2022-08-16 DIAGNOSIS — E1165 Type 2 diabetes mellitus with hyperglycemia: Secondary | ICD-10-CM

## 2022-08-16 DIAGNOSIS — Z6841 Body Mass Index (BMI) 40.0 and over, adult: Secondary | ICD-10-CM

## 2022-08-16 DIAGNOSIS — F5105 Insomnia due to other mental disorder: Secondary | ICD-10-CM | POA: Diagnosis not present

## 2022-08-16 DIAGNOSIS — F99 Mental disorder, not otherwise specified: Secondary | ICD-10-CM | POA: Insufficient documentation

## 2022-08-16 DIAGNOSIS — G8929 Other chronic pain: Secondary | ICD-10-CM | POA: Diagnosis not present

## 2022-08-16 DIAGNOSIS — E66813 Obesity, class 3: Secondary | ICD-10-CM | POA: Insufficient documentation

## 2022-08-16 DIAGNOSIS — G4701 Insomnia due to medical condition: Secondary | ICD-10-CM

## 2022-08-16 DIAGNOSIS — L409 Psoriasis, unspecified: Secondary | ICD-10-CM

## 2022-08-16 NOTE — Patient Instructions (Addendum)
ASSESSMENT AND PLAN 47 y.o. year old female  here with:   1) chronic insomnia- sleep initiation: She is struggling to fall asleep- not because of pain as much as because her mind is racing. Anxiety induced. She feels worried all the time.   2) insomnia for sleep duration: She has fragmented sleep, mostly waking up feeling hot or through pain in the left leg.  She has psoriasis, arthritis and radiculopathy and sees spine and scoliosis specialist. Dr Sharolyn Douglas is her spinal /neurosurgeon.   The cats are rarely waking her, and they are not in the bedroom with her .    Nocturia is present, but it is not the urge to urinate  that wakes her, she is already awake and then goes to the bathroom. Uncontrolled DM may cause polyuria.   3)she is not sure about snoring or apnea: But risk factors for sleep apnea are plenty, 50 plus BMI and  upper airway anatomy, small oral opening and larger neck. Waking with dry mouth and sometimes morning headaches, dull and localized to the forehead and anterior temples. This can be a hypoxia related symptom.      Insomnia Insomnia is a sleep disorder that makes it difficult to fall asleep or stay asleep. Insomnia can cause fatigue, low energy, difficulty concentrating, mood swings, and poor performance at work or school. There are three different ways to classify insomnia: Difficulty falling asleep. Difficulty staying asleep. Waking up too early in the morning. Any type of insomnia can be long-term (chronic) or short-term (acute). Both are common. Short-term insomnia usually lasts for 3 months or less. Chronic insomnia occurs at least three times a week for longer than 3 months. What are the causes? Insomnia may be caused by another condition, situation, or substance, such as: Having certain mental health conditions, such as anxiety and depression. Using caffeine, alcohol, tobacco, or drugs. Having gastrointestinal conditions, such as gastroesophageal reflux  disease (GERD). Having certain medical conditions. These include: Asthma. Alzheimer's disease. Stroke. Chronic pain. An overactive thyroid gland (hyperthyroidism). Other sleep disorders, such as restless legs syndrome and sleep apnea. Menopause. Sometimes, the cause of insomnia may not be known. What increases the risk? Risk factors for insomnia include: Gender. Females are affected more often than males. Age. Insomnia is more common as people get older. Stress and certain medical and mental health conditions. Lack of exercise. Having an irregular work schedule. This may include working night shifts and traveling between different time zones. What are the signs or symptoms? If you have insomnia, the main symptom is having trouble falling asleep or having trouble staying asleep. This may lead to other symptoms, such as: Feeling tired or having low energy. Feeling nervous about going to sleep. Not feeling rested in the morning. Having trouble concentrating. Feeling irritable, anxious, or depressed. How is this diagnosed? This condition may be diagnosed based on: Your symptoms and medical history. Your health care provider may ask about: Your sleep habits. Any medical conditions you have. Your mental health. A physical exam. How is this treated? Treatment for insomnia depends on the cause. Treatment may focus on treating an underlying condition that is causing the insomnia. Treatment may also include: Medicines to help you sleep. Counseling or therapy. Lifestyle adjustments to help you sleep better. Follow these instructions at home: Eating and drinking  Limit or avoid alcohol, caffeinated beverages, and products that contain nicotine and tobacco, especially close to bedtime. These can disrupt your sleep. Do not eat a large meal or eat  spicy foods right before bedtime. This can lead to digestive discomfort that can make it hard for you to sleep. Sleep habits  Keep a sleep  diary to help you and your health care provider figure out what could be causing your insomnia. Write down: When you sleep. When you wake up during the night. How well you sleep and how rested you feel the next day. Any side effects of medicines you are taking. What you eat and drink. Make your bedroom a dark, comfortable place where it is easy to fall asleep. Put up shades or blackout curtains to block light from outside. Use a white noise machine to block noise. Keep the temperature cool. Limit screen use before bedtime. This includes: Not watching TV. Not using your smartphone, tablet, or computer. Stick to a routine that includes going to bed and waking up at the same times every day and night. This can help you fall asleep faster. Consider making a quiet activity, such as reading, part of your nighttime routine. Try to avoid taking naps during the day so that you sleep better at night. Get out of bed if you are still awake after 15 minutes of trying to sleep. Keep the lights down, but try reading or doing a quiet activity. When you feel sleepy, go back to bed. General instructions Take over-the-counter and prescription medicines only as told by your health care provider. Exercise regularly as told by your health care provider. However, avoid exercising in the hours right before bedtime. Use relaxation techniques to manage stress. Ask your health care provider to suggest some techniques that may work well for you. These may include: Breathing exercises. Routines to release muscle tension. Visualizing peaceful scenes. Make sure that you drive carefully. Do not drive if you feel very sleepy. Keep all follow-up visits. This is important. Contact a health care provider if: You are tired throughout the day. You have trouble in your daily routine due to sleepiness. You continue to have sleep problems, or your sleep problems get worse. Get help right away if: You have thoughts about  hurting yourself or someone else. Get help right away if you feel like you may hurt yourself or others, or have thoughts about taking your own life. Go to your nearest emergency room or: Call 911. Call the National Suicide Prevention Lifeline at 252-448-7783 or 988. This is open 24 hours a day. Text the Crisis Text Line at (331)113-2369. Summary Insomnia is a sleep disorder that makes it difficult to fall asleep or stay asleep. Insomnia can be long-term (chronic) or short-term (acute). Treatment for insomnia depends on the cause. Treatment may focus on treating an underlying condition that is causing the insomnia. Keep a sleep diary to help you and your health care provider figure out what could be causing your insomnia. This information is not intended to replace advice given to you by your health care provider. Make sure you discuss any questions you have with your health care provider. Document Revised: 03/16/2021 Document Reviewed: 03/16/2021 Elsevier Patient Education  2023 [employer].

## 2022-08-16 NOTE — Progress Notes (Signed)
SLEEP MEDICINE CLINIC    Provider:  Melvyn Novas, MD  Primary Care Physician:  Avanell Shackleton, NP-C [address] [place] [address]     Referring Provider: Mart Piggs [address] [place],  [place] [address]          Chief Complaint according to patient   Patient presents with:     New Patient (Initial Visit)     RM 1 alone, NEW SLEEP CONSULT - reports Chronic insomnia-reports trouble falling asleep and staying asleep, being in pain- daytime fatigue mind is racing- classic anxiety insomnia.   She has morning headaches about 1-2 times a week  pain is in the forehead, bilateral.  If she drinks caffeine the headache will go away. Symptoms have gradually worsen over the last year. Uncontrolled diabetes. Morbid Obesity BMI 50.       HISTORY OF PRESENT ILLNESS:  April Gonzales is a 47 y.o. female patient who is seen upon referral on 08/16/2022 from NP [facility].  Referred for chronic Insomnia.  Chief concern according to patient :  see above     April Gonzales was seen on 08/16/22 upon NP referral by Charlyn Minerva, NP, a right-handed female with an insomnia sleep disorder, chronic and worsening over the last 12 months. Wakes in pain.   The patient had no previous sleep study. Sleep relevant medical history: Nocturia  every 2 hours, chronic  back and radicular leg pain , left leg numbness.  Ankle Edema, no Sleep walking,  uncontrolled DM 2, lower spine surgery , discectomy 2011-2012, weight gain and now reportedly lumbar spinal stenosis. Family medical /sleep history: no other family member with OSA.   Social history:  Patient is  disabled , used to be working for [employer] , unable to work due to pain - and lives in a household alone. She lives in a studio- uses a walker.  Family status is single. Pets are present: 2 cats . Tobacco use- none.  ETOH use ; none, Caffeine intake in form of Coffee( /) Soda( pepsi 1-2 of 16 ounce cups a day ) Tea ( sweet tea 1-2  cups a day) or energy drinks. Exercise is limited.     Sleep habits are as follows: The patient's dinner time is between 5-6 PM. The patient goes to bed at 9-10.30 PM and continues to read on her smart phone in bed- it takes 45 -60 minutes to sleep . Once asleep , she sleeps in intervals of 2  hours, wakes for  pain or unknown reason , but goes for bathroom breaks, the first time at 2 AM.  The bedroom is cool,, quiet and dark after she switches the phone off,  fan is running.  Showers in the morning.  The preferred sleep position is left sided, with the support of 2-4 pillows.  Dreams are reportedly frequent/vivid. Sometimes having nightmares.   The patient wakes up spontaneously or is woken by her cats: 9.30  with an alarm. 10.30  AM is the usual rise time. She would spent 12 hours in bed. Has  never heard of avoiding screen time, blue light or setting routines.  She reports not feeling refreshed or restored in AM, with symptoms such as dry mouth,  drowsy, with dull morning headaches, and residual fatigue.  Naps are taken in front of the Tv , rather frequently, and lasting from 30 to 60 minutes and are refreshing.    Review  of Systems: Out of a complete 14 system review, the patient complains of only the following symptoms, and all other reviewed systems are negative.:  Fatigue, sleepiness , snoring, fragmented sleep, Insomnia, spinal degenerative disease, status post surgery, radicular pain.  Frequent Nocturia    How likely are you to doze in the following situations: 0 = not likely, 1 = slight chance, 2 = moderate chance, 3 = high chance   Sitting and Reading? Watching Television? Sitting inactive in a public place (theater or meeting)? As a passenger in a car for an hour without a break? Lying down in the afternoon when circumstances permit? Sitting and talking to someone? Sitting quietly after lunch without alcohol? In a car, while stopped for a few minutes in traffic?   Total = 13/  24 points   FSS endorsed at 45/ 63 points.   Social History   Socioeconomic History   Marital status: Single    Spouse name: Not on file   Number of children: Not on file   Years of education: Not on file   Highest education level: Not on file  Occupational History   Occupation: Currently on leave of absence  Tobacco Use   Smoking status: Never   Smokeless tobacco: Never  Substance and Sexual Activity   Alcohol use: No   Drug use: No   Sexual activity: Not on file  Other Topics Concern   Not on file  Social History Narrative   Not on file   Social Determinants of Health   Financial Resource Strain: Not on file  Food Insecurity: Not on file  Transportation Needs: Not on file  Physical Activity: Not on file  Stress: Not on file  Social Connections: Not on file    Family History  Problem Relation Age of Onset   Hyperlipidemia Mother    Diabetes Mother    Diabetes Mellitus II Mother    Hypertension Mother    Obesity Mother    Obesity Father    Hypertension Father    CAD Father    Chronic Renal Failure Father    Hyperlipidemia Father    Heart disease Father    Kidney disease Father    Alcoholism Father     Past Medical History:  Diagnosis Date   Anemia    Anxiety    B12 deficiency    Back pain    Celiac disease    Chronic fatigue syndrome    Constipation    Depression    Gallbladder problem    Heartburn    Hypertension    Joint pain    Pancreatic disease    Psoriasis    SOBOE (shortness of breath on exertion)    Type 2 diabetes mellitus without complication, without long-term current use of insulin (HCC) 11/19/2021   Vitamin D deficiency     Past Surgical History:  Procedure Laterality Date   CHOLECYSTECTOMY     WISDOM TOOTH EXTRACTION       Current Outpatient Medications on File Prior to Visit  Medication Sig Dispense Refill   busPIRone (BUSPAR) 5 MG tablet Take 5 mg by mouth 2 (two) times daily.     Dulaglutide (TRULICITY) 1.5 MG/0.5ML  SOPN Inject 1.5 mg into the skin once a week. 6 mL 0   escitalopram (LEXAPRO) 20 MG tablet Take 1 tablet (20 mg total) by mouth daily. 30 tablet 2   fluconazole (DIFLUCAN) 100 MG tablet Take 100 mg by mouth 3 (three) times a week.  glipiZIDE (GLUCOTROL XL) 5 MG 24 hr tablet TAKE 1 TABLET BY MOUTH EVERY DAY WITH BREAKFAST 90 tablet 1   hydrocortisone-pramoxine (PROCTOFOAM-HC) rectal foam Place 1 applicator rectally 2 (two) times daily. 10 g 1   lisinopril (ZESTRIL) 40 MG tablet Take 1 tablet (40 mg total) by mouth daily. 90 tablet 1   metFORMIN (GLUCOPHAGE) 1000 MG tablet TAKE 1 TABLET (1,000 MG TOTAL) BY MOUTH TWICE A DAY WITH FOOD 180 tablet 1   nystatin cream (MYCOSTATIN) Apply 1 Application topically 2 (two) times daily.     pregabalin (LYRICA) 75 MG capsule Take 75 mg by mouth as needed.     Risankizumab-rzaa (SKYRIZI) 150 MG/ML SOSY Inject into the skin.     Tapinarof (VTAMA) 1 % CREA 1 application Externally Once a day for 30 days     triamcinolone cream (KENALOG) 0.1 % Apply 1 application topically 2 (two) times daily as needed (psoriasis).      Urea 45 % CREA 1 application as needed Externally Once a day     Vitamin D, Ergocalciferol, (DRISDOL) 1.25 MG (50000 UNIT) CAPS capsule Take 1 capsule (50,000 Units total) by mouth 2 (two) times a week. 10 capsule 0   No current facility-administered medications on file prior to visit.    Allergies  Allergen Reactions   Dilaudid [Hydromorphone Hcl] Nausea And Vomiting   Hydromorphone     Other reaction(s): Unknown   Methotrexate Other (See Comments)     DIAGNOSTIC DATA (LABS, IMAGING, TESTING) - I reviewed patient records, labs, notes, testing and imaging myself where available.  Lab Results  Component Value Date   WBC 11.8 (H) 05/27/2022   HGB 13.5 05/27/2022   HCT 42.2 05/27/2022   MCV 80.4 05/27/2022   PLT 291.0 05/27/2022      Component Value Date/Time   NA 136 05/27/2022 1524   K 4.7 05/27/2022 1524   CL 98 05/27/2022  1524   CO2 28 05/27/2022 1524   GLUCOSE 263 (H) 05/27/2022 1524   BUN 11 05/27/2022 1524   CREATININE 0.69 05/27/2022 1524   CALCIUM 9.4 05/27/2022 1524   PROT 7.9 05/27/2022 1524   ALBUMIN 3.7 05/27/2022 1524   AST 29 05/27/2022 1524   ALT 31 05/27/2022 1524   ALKPHOS 122 (H) 05/27/2022 1524   BILITOT 0.5 05/27/2022 1524   GFRNONAA >60 04/06/2019 1242   GFRAA >60 04/06/2019 1242   Lab Results  Component Value Date   CHOL 105 12/24/2021   HDL 38.60 (L) 12/24/2021   LDLCALC 42 12/24/2021   TRIG 119.0 12/24/2021   CHOLHDL 3 12/24/2021   Lab Results  Component Value Date   HGBA1C 12.3 (H) 05/27/2022   Lab Results  Component Value Date   VITAMINB12 247 05/27/2022   Lab Results  Component Value Date   TSH 1.94 05/27/2022    PHYSICAL EXAM:   Dedicated to SLEEP disorders.    Today's Vitals   08/16/22 1250 08/16/22 1255  BP: (!) 164/78 (!) 146/86  Pulse: 75 76  Weight: (!) 305 lb (138.3 kg)   Height: 5\' 5"  (1.651 m)    Body mass index is 50.75 kg/m.   Wt Readings from Last 3 Encounters:  08/16/22 (!) 305 lb (138.3 kg)  08/05/22 (!) 302 lb 6.4 oz (137.2 kg)  08/03/22 300 lb (136.1 kg)     Ht Readings from Last 3 Encounters:  08/16/22 5\' 5"  (1.651 m)  08/05/22 5\' 5"  (1.651 m)  08/03/22 5\' 5"  (1.651 m)  General: The patient is awake, alert and appears not in acute distress. The patient is well groomed. Head: Normocephalic, atraumatic.  Neck is supple. Mallampati 3 plus , pale ,  neck circumference:18.5 inches .  Nasal airflow is patent.   Retrognathia is seen. Small oral opening,  Dental status: biological  Cardiovascular:  Regular rate and cardiac rhythm by pulse,  without distended neck veins. Respiratory: Lungs are clear to auscultation.  Skin:  With evidence of ankle edema, and left leg rash. Trunk: The patient's posture is stooped.    NEUROLOGIC EXAM: The patient is awake and alert, oriented to place and time.   Memory subjective described as  intact.  Attention span & concentration ability appears normal.  Speech is fluent,  without dysarthria, with only mild dysphonia , no  aphasia.  Mood and affect are appropriate.   Cranial nerves: no loss of smell or taste reported  Pupils are equal and briskly reactive to light. Funduscopic exam deferred. .  Extraocular movements in vertical and horizontal planes were intact and without nystagmus. No Diplopia. Visual fields by finger perimetry are intact. Hearing was intact to soft voice and finger rubbing.   Facial sensation intact to fine touch.  Facial motor strength is symmetric and tongue and uvula move midline.  Neck ROM : rotation, tilt and flexion extension were normal for age and shoulder shrug was symmetrical.    Motor exam:  Symmetric bulk, tone and ROM for upper extremities- .   Normal tone without cog -wheeling, symmetric grip strength .   Sensory:  Fine touch and vibration were tested- left leg and ankles less sensitive to tuning fork, but felt pressure.  Proprioception tested in the upper extremities was normal.   Coordination: Rapid alternating movements in the fingers/hands were of normal speed.  The Finger-to-nose maneuver was intact without evidence of ataxia, dysmetria or tremor.   Gait and station: Patient set on her walker-   Deep tendon reflexes: in the  upper extremities are symmetric and intact.  There was no patella reflex or achilles tendon reflex elicited.  Babinski response was deferred.    ASSESSMENT AND PLAN 47 y.o. year old female  here with:   1) chronic insomnia- sleep initiation: She is struggling to fall asleep- not because of pain as much as because her mind is racing. Anxiety induced. She feels worried all the time.   2) insomnia for sleep duration: She has fragmented sleep, mostly waking up feeling hot or through pain in the left leg.  She has psoriasis, arthritis and radiculopathy and sees spine and scoliosis specialist. Dr Sharolyn Douglas is her  spinal /neurosurgeon.   The cats are rarely waking her, and they are not in the bedroom with her .    Nocturia is present, but it is not the urge to urinate  that wakes her, she is already awake and then goes to the bathroom. Uncontrolled DM may cause polyuria.   3)she is not sure about snoring or apnea: But risk factors for sleep apnea are plenty, 50 plus BMI and  upper airway anatomy, small oral opening and larger neck. Waking with dry mouth and sometimes morning headaches, dull and localized to the forehead and anterior temples. This can be a hypoxia related symptom.    I plan to follow up either personally or through our NP within 3-5 months.   I would like to thank Avanell Shackleton, NP-C and [place], [facility] [address] [place],  [place] [address] for  choosing [place] to provide sleep testing and sleep care for organic sleep disorders at [facility] sleep   CC: I will share my notes with PCP .  After spending a total time of  45  minutes face to face and additional time for physical and neurologic examination, review of laboratory studies,  personal review of imaging studies, reports and results of other testing and review of referral information / records as far as provided in visit,   Electronically signed by: Melvyn Novas, MD 08/16/2022 1:08 PM  [facility] Board certified by The [employer] of Sleep Medicine and Diplomate of the [employer] of Sleep Medicine. Board certified In Neurology through the ABPN, Fellow of the [employer] of Neurology. Medical Director of [facility].

## 2022-08-19 ENCOUNTER — Ambulatory Visit (INDEPENDENT_AMBULATORY_CARE_PROVIDER_SITE_OTHER): Payer: Commercial Managed Care - HMO | Admitting: Physician Assistant

## 2022-08-19 ENCOUNTER — Encounter (INDEPENDENT_AMBULATORY_CARE_PROVIDER_SITE_OTHER): Payer: Self-pay | Admitting: Physician Assistant

## 2022-08-19 ENCOUNTER — Other Ambulatory Visit (HOSPITAL_BASED_OUTPATIENT_CLINIC_OR_DEPARTMENT_OTHER): Payer: Self-pay

## 2022-08-19 VITALS — BP 147/86 | HR 78 | Temp 98.0°F | Ht 65.0 in | Wt 301.0 lb

## 2022-08-19 DIAGNOSIS — G4730 Sleep apnea, unspecified: Secondary | ICD-10-CM | POA: Diagnosis not present

## 2022-08-19 DIAGNOSIS — Z6841 Body Mass Index (BMI) 40.0 and over, adult: Secondary | ICD-10-CM

## 2022-08-19 DIAGNOSIS — Z7985 Long-term (current) use of injectable non-insulin antidiabetic drugs: Secondary | ICD-10-CM

## 2022-08-19 DIAGNOSIS — E1169 Type 2 diabetes mellitus with other specified complication: Secondary | ICD-10-CM

## 2022-08-19 DIAGNOSIS — I10 Essential (primary) hypertension: Secondary | ICD-10-CM | POA: Diagnosis not present

## 2022-08-19 DIAGNOSIS — E559 Vitamin D deficiency, unspecified: Secondary | ICD-10-CM

## 2022-08-19 MED ORDER — TRULICITY 1.5 MG/0.5ML ~~LOC~~ SOAJ
1.5000 mg | SUBCUTANEOUS | 0 refills | Status: DC
  Filled 2022-08-19 – 2022-09-01 (×2): qty 2, 28d supply, fill #0

## 2022-08-19 NOTE — Assessment & Plan Note (Signed)
Hypertension Hypertension asymptomatic, no significant medication side effects noted, poorly controlled, needs further observation, and needs improvement.  Medication(s): lisinopril 40 mg daily ~ (at max dose)     BP Readings from Last 3 Encounters:  08/19/22 (!) 147/86  08/16/22 (!) 146/86  08/05/22 (!) 140/80   Lab Results  Component Value Date   CREATININE 0.69 05/27/2022   CREATININE 0.66 03/26/2022   CREATININE 0.71 02/26/2022   Lab Results  Component Value Date   GFR 103.31 05/27/2022   GFR 104.54 03/26/2022   GFR 101.39 02/26/2022    Plan: BP elevated . Discussed watching salt intake, continuing to work on nutrition plan to promote weight loss.  Patient eating canned soups and discussed usually high in sodium. Discussed limited sodium intake and monitor BP closely over the next 2 weeks. May need additional medication for BP control/follow up with PCP.

## 2022-08-19 NOTE — Assessment & Plan Note (Signed)
Type 2 Diabetes Mellitus with other specified complication, without long-term current use of insulin HgbA1c is not at goal. Last A1c was 12.3 CBGs: Fasting 200-250 Episodes of hypoglycemia: no Medication(s): Trulicity 1.5 mg SQ weekly History of pancreatitis in past, but has tolerated trulicity well.  Denies mass in neck, dysphagia, dyspepsia, persistent hoarseness, abdominal pain, or N/V/Constipation or diarrhea. Has annual eye exam. Mood is stable. Less constipation as eating more apples/increased fiber.    Lab Results  Component Value Date   HGBA1C 12.3 (H) 05/27/2022   HGBA1C 13.1 (H) 02/26/2022   HGBA1C 13.8 (H) 11/19/2021   Lab Results  Component Value Date   MICROALBUR 7.9 (H) 02/26/2022   LDLCALC 42 12/24/2021   CREATININE 0.69 05/27/2022   Lab Results  Component Value Date   GFR 103.31 05/27/2022   GFR 104.54 03/26/2022   GFR 101.39 02/26/2022    Plan: Continue and refill Trulicity 1.5 mg SQ weekly Monitor closely for any signs of abdominal pain with history of pancreatitis in past.  Continue working on nutrition plan to decrease simple carbohydrates, increase lean proteins and exercise to promote weight loss, improve glycemic control.  Follow closely with Endocrinology.

## 2022-08-19 NOTE — Assessment & Plan Note (Addendum)
Vitamin D Deficiency Vitamin D is not at goal of 50.  Most recent vitamin D level was 4.7. She is on  prescription ergocalciferol 50,000 IU twice weekly. Lab Results  Component Value Date   VD25OH 4.7 (L) 05/19/2022    Plan: Continue  prescription ergocalciferol 50,000 IU twice weekly Low vitamin D levels can be associated with adiposity and may result in leptin resistance and weight gain. Also associated with fatigue. Currently on vitamin D supplementation without any adverse effects.  Will plan to recheck vitamin D level next visit .

## 2022-08-19 NOTE — Progress Notes (Signed)
Office: [phone]  /  Fax: [phone]  WEIGHT SUMMARY AND BIOMETRICS  Vitals Temp: 98 F (36.7 C) BP: (!) 147/86 Pulse Rate: 78 SpO2: 99 %   Anthropometric Measurements Height: 5\' 5"  (1.651 m) Weight: (!) 301 lb (136.5 kg) BMI (Calculated): 50.09 Weight at Last Visit: 300 lb Weight Gained Since Last Visit: 1 lb Starting Weight: 299 lb Total Weight Loss (lbs): 0 lb (0 kg)   Body Composition  Body Fat %: 42.7 % Fat Mass (lbs): 128.8 lbs Muscle Mass (lbs): 164.2 lbs Total Body Water (lbs): 102.2 lbs Visceral Fat Rating : 15   Other Clinical Data Fasting: yes Labs: No Today's Visit #: 7 Starting Date: 05/19/22     HPI  Chief Complaint: OBESITY  April Gonzales is here to discuss her progress with her obesity treatment plan. She is on the the Category 4 Plan and states she is following her eating plan approximately 85 % of the time. She states she is exercising 0 minutes 0 times per week.   Interval History:  Since last office visit she is up 1 lb. Reports visit with Endocrinology- Ronny Bacon, FNP, very helpful and has follow up in ~2 weeks.   Hunger/appetite- fair control- reports increased hunger at times.  Hydration- Trying to increase water intake, but continues to drink some 2 mini Pepsi daily.  Sleep- disrupted, reports waking up due to pain at times. Is going to do home sleep study per  Dr. Vickey Huger.  Exercise- very limited due to back pain, balance issues. Is interested in getting into pool and wants to go [place] [facility] as easier getting into and out of this pool. Will open at end of month.   Snacking on popcorn and orange sherbet and we discussed better snacking options and provided 100 calorie protein snack handout to aid in choices.   Goal of Hgb A1C < 7 in order for surgery for spinal stenosis. Activity very limited due to back pain.   Pharmacotherapy: Trulicity 1.5 mg weekly. Hx of pancreatitis in past x 2, but has tolerated Trulicity well.   Denies mass in neck, dysphagia, dyspepsia, persistent hoarseness, abdominal pain, or N/V/Constipation or diarrhea. Has annual eye exam. Mood is stable- sees Psychiatry monthly.    PHYSICAL EXAM:  Blood pressure (!) 147/86, pulse 78, temperature 98 F (36.7 C), height 5\' 5"  (1.651 m), weight (!) 301 lb (136.5 kg), SpO2 99 %. Body mass index is 50.09 kg/m.  General: She is overweight, cooperative, alert, well developed, and in no acute distress. Cushingoid appearance. Walks with rolling walker.  PSYCH: Has normal mood, affect and thought process.   Cardiovascular: HR 70's regular Lungs: Normal breathing effort, no conversational dyspnea.  DIAGNOSTIC DATA REVIEWED:  BMET    Component Value Date/Time   NA 136 05/27/2022 1524   K 4.7 05/27/2022 1524   CL 98 05/27/2022 1524   CO2 28 05/27/2022 1524   GLUCOSE 263 (H) 05/27/2022 1524   BUN 11 05/27/2022 1524   CREATININE 0.69 05/27/2022 1524   CALCIUM 9.4 05/27/2022 1524   GFRNONAA >60 04/06/2019 1242   GFRAA >60 04/06/2019 1242   Lab Results  Component Value Date   HGBA1C 12.3 (H) 05/27/2022   HGBA1C 6.6 (H) 02/21/2016   Lab Results  Component Value Date   INSULIN 79.5 (H) 05/19/2022   Lab Results  Component Value Date   TSH 1.94 05/27/2022   CBC    Component Value Date/Time   WBC 11.8 (H) 05/27/2022 1524  RBC 5.24 (H) 05/27/2022 1524   HGB 13.5 05/27/2022 1524   HCT 42.2 05/27/2022 1524   PLT 291.0 05/27/2022 1524   MCV 80.4 05/27/2022 1524   MCH 25.7 (L) 04/06/2019 1242   MCHC 32.1 05/27/2022 1524   RDW 15.8 (H) 05/27/2022 1524   Iron Studies No results found for: "IRON", "TIBC", "FERRITIN", "IRONPCTSAT" Lipid Panel     Component Value Date/Time   CHOL 105 12/24/2021 1052   TRIG 119.0 12/24/2021 1052   HDL 38.60 (L) 12/24/2021 1052   CHOLHDL 3 12/24/2021 1052   VLDL 23.8 12/24/2021 1052   LDLCALC 42 12/24/2021 1052   Hepatic Function Panel     Component Value Date/Time   PROT 7.9 05/27/2022 1524    ALBUMIN 3.7 05/27/2022 1524   AST 29 05/27/2022 1524   ALT 31 05/27/2022 1524   ALKPHOS 122 (H) 05/27/2022 1524   BILITOT 0.5 05/27/2022 1524      Component Value Date/Time   TSH 1.94 05/27/2022 1524   Nutritional Lab Results  Component Value Date   VD25OH 4.7 (L) 05/19/2022    ASSOCIATED CONDITIONS ADDRESSED TODAY  ASSESSMENT AND PLAN  Problem List Items Addressed This Visit     Morbid obesity (HCC)   Relevant Medications   Dulaglutide (TRULICITY) 1.5 MG/0.5ML SOPN   Type 2 diabetes mellitus with other specified complication (HCC)    Type 2 Diabetes Mellitus with other specified complication, without long-term current use of insulin HgbA1c is not at goal. Last A1c was 12.3 CBGs: Fasting 200-250 Episodes of hypoglycemia: no Medication(s): Trulicity 1.5 mg SQ weekly History of pancreatitis in past, but has tolerated trulicity well.  Denies mass in neck, dysphagia, dyspepsia, persistent hoarseness, abdominal pain, or N/V/Constipation or diarrhea. Has annual eye exam. Mood is stable. Less constipation as eating more apples/increased fiber.    Lab Results  Component Value Date   HGBA1C 12.3 (H) 05/27/2022   HGBA1C 13.1 (H) 02/26/2022   HGBA1C 13.8 (H) 11/19/2021   Lab Results  Component Value Date   MICROALBUR 7.9 (H) 02/26/2022   LDLCALC 42 12/24/2021   CREATININE 0.69 05/27/2022   Lab Results  Component Value Date   GFR 103.31 05/27/2022   GFR 104.54 03/26/2022   GFR 101.39 02/26/2022   Plan: Continue and refill Trulicity 1.5 mg SQ weekly Monitor closely for any signs of abdominal pain with history of pancreatitis in past.  Continue working on nutrition plan to decrease simple carbohydrates, increase lean proteins and exercise to promote weight loss, improve glycemic control.  Follow closely with Endocrinology.        Relevant Medications   Dulaglutide (TRULICITY) 1.5 MG/0.5ML SOPN   Essential hypertension - Primary    Hypertension Hypertension  asymptomatic, no significant medication side effects noted, poorly controlled, needs further observation, and needs improvement.  Medication(s): lisinopril 40 mg daily ~ (at max dose)     BP Readings from Last 3 Encounters:  08/19/22 (!) 147/86  08/16/22 (!) 146/86  08/05/22 (!) 140/80   Lab Results  Component Value Date   CREATININE 0.69 05/27/2022   CREATININE 0.66 03/26/2022   CREATININE 0.71 02/26/2022   Lab Results  Component Value Date   GFR 103.31 05/27/2022   GFR 104.54 03/26/2022   GFR 101.39 02/26/2022   Plan: BP elevated . Discussed watching salt intake, continuing to work on nutrition plan to promote weight loss.  Patient eating canned soups and discussed usually high in sodium. Discussed limited sodium intake and monitor BP closely over the next  2 weeks. May need additional medication for BP control/follow up with PCP.         Vitamin D deficiency    Vitamin D Deficiency Vitamin D is not at goal of 50.  Most recent vitamin D level was 4.7. She is on  prescription ergocalciferol 50,000 IU twice weekly. Lab Results  Component Value Date   VD25OH 4.7 (L) 05/19/2022   Plan: Continue  prescription ergocalciferol 50,000 IU twice weekly Low vitamin D levels can be associated with adiposity and may result in leptin resistance and weight gain. Also associated with fatigue. Currently on vitamin D supplementation without any adverse effects.  Will plan to recheck vitamin D level next visit .        Sleep-disordered breathing    Saw Dr. Vickey Huger and going to have home sleep study for suspected sleep apnea.   Plan:Will follow with Dr. Vickey Huger.      BMI 50.0-59.9, adult (HCC)   Relevant Medications   Dulaglutide (TRULICITY) 1.5 MG/0.5ML SOPN   Current BMI 50.2  TREATMENT PLAN FOR OBESITY:  Recommended Dietary Goals  Tameshia is currently in the action stage of change. As such, her goal is to continue weight management plan. She has agreed to the Category 4  Plan.  Behavioral Intervention  We discussed the following Behavioral Modification Strategies today: increasing lean protein intake, decreasing simple carbohydrates , increasing vegetables, avoiding skipping meals, increasing water intake, continue to work on implementation of reduced calorie nutritional plan, continue to practice mindfulness when eating, planning for success, better snacking choices, and keeping healthy foods at home.  Additional resources provided today: 100 calorie protein snack options provided.   Recommended Physical Activity Goals  Maison has been advised to work up to 150 minutes of moderate intensity aerobic activity a week and strengthening exercises 2-3 times per week for cardiovascular health, weight loss maintenance and preservation of muscle mass.   She has agreed to Continue current level of physical activity    Pharmacotherapy We discussed various medication options to help Akiyah with her weight loss efforts and we both agreed to continue Trulicity 1.5 mg weekly for Type 2 diabetes and continue to work on nutritional and behavioral strategies to promote weight loss.    Return in about 2 weeks (around 09/02/2022).[name] She was informed of the importance of frequent follow up visits to maximize her success with intensive lifestyle modifications for her multiple health conditions.   ATTESTASTION STATEMENTS:  Reviewed by clinician on day of visit: allergies, medications, problem list, medical history, surgical history, family history, social history, and previous encounter notes.   I have personally spent 42 minutes total time today in preparation, patient care, nutritional counseling and documentation for this visit, including the following: review of clinical lab tests; review of medical tests/procedures/services.      Parthiv Mucci, PA-C

## 2022-08-19 NOTE — Assessment & Plan Note (Signed)
Saw Dr. Vickey Huger and going to have home sleep study for suspected sleep apnea.   Plan:Will follow with Dr. Vickey Huger.

## 2022-08-24 ENCOUNTER — Encounter (HOSPITAL_BASED_OUTPATIENT_CLINIC_OR_DEPARTMENT_OTHER): Payer: Self-pay

## 2022-08-24 ENCOUNTER — Emergency Department (HOSPITAL_BASED_OUTPATIENT_CLINIC_OR_DEPARTMENT_OTHER)
Admission: EM | Admit: 2022-08-24 | Discharge: 2022-08-24 | Disposition: A | Discharge status: Home / Self Care | Payer: Commercial Managed Care - HMO | Attending: Emergency Medicine | Admitting: Emergency Medicine

## 2022-08-24 ENCOUNTER — Other Ambulatory Visit (HOSPITAL_BASED_OUTPATIENT_CLINIC_OR_DEPARTMENT_OTHER): Payer: Self-pay

## 2022-08-24 ENCOUNTER — Other Ambulatory Visit: Payer: Self-pay

## 2022-08-24 ENCOUNTER — Emergency Department (HOSPITAL_BASED_OUTPATIENT_CLINIC_OR_DEPARTMENT_OTHER): Payer: Commercial Managed Care - HMO

## 2022-08-24 DIAGNOSIS — Z79899 Other long term (current) drug therapy: Secondary | ICD-10-CM | POA: Insufficient documentation

## 2022-08-24 DIAGNOSIS — Z7984 Long term (current) use of oral hypoglycemic drugs: Secondary | ICD-10-CM | POA: Insufficient documentation

## 2022-08-24 DIAGNOSIS — I10 Essential (primary) hypertension: Secondary | ICD-10-CM | POA: Insufficient documentation

## 2022-08-24 DIAGNOSIS — R112 Nausea with vomiting, unspecified: Secondary | ICD-10-CM | POA: Diagnosis present

## 2022-08-24 DIAGNOSIS — E119 Type 2 diabetes mellitus without complications: Secondary | ICD-10-CM | POA: Insufficient documentation

## 2022-08-24 DIAGNOSIS — N201 Calculus of ureter: Secondary | ICD-10-CM

## 2022-08-24 HISTORY — DX: Type 2 diabetes mellitus without complications: E11.9

## 2022-08-24 HISTORY — DX: Acute pancreatitis without necrosis or infection, unspecified: K85.90

## 2022-08-24 LAB — URINALYSIS, ROUTINE W REFLEX MICROSCOPIC
Bacteria, UA: NONE SEEN
Bilirubin Urine: NEGATIVE
Glucose, UA: 500 mg/dL — AB
Hgb urine dipstick: NEGATIVE
Ketones, ur: NEGATIVE mg/dL
Leukocytes,Ua: NEGATIVE
Nitrite: NEGATIVE
Specific Gravity, Urine: >1.046 — ABNORMAL HIGH (ref 1.005–1.030)
pH: 6 (ref 5.0–8.0)

## 2022-08-24 LAB — COMPREHENSIVE METABOLIC PANEL
ALT: 27 U/L (ref 0–44)
AST: 20 U/L (ref 15–41)
Albumin: 3.7 g/dL (ref 3.5–5.0)
Alkaline Phosphatase: 89 U/L (ref 38–126)
Anion gap: 13 (ref 5–15)
BUN: 17 mg/dL (ref 6–20)
CO2: 21 mmol/L — ABNORMAL LOW (ref 22–32)
Calcium: 9 mg/dL (ref 8.9–10.3)
Chloride: 99 mmol/L (ref 98–111)
Creatinine, Ser: 1.05 mg/dL — ABNORMAL HIGH (ref 0.44–1.00)
GFR, Estimated: >60 mL/min (ref >60)
Glucose, Bld: 244 mg/dL — ABNORMAL HIGH (ref 70–99)
Potassium: 4.1 mmol/L (ref 3.5–5.1)
Sodium: 133 mmol/L — ABNORMAL LOW (ref 135–145)
Total Bilirubin: 0.6 mg/dL (ref 0.3–1.2)
Total Protein: 7.9 g/dL (ref 6.5–8.1)

## 2022-08-24 LAB — CBC WITH DIFFERENTIAL/PLATELET
Abs Immature Granulocytes: 0.07 10*3/uL (ref 0.00–0.07)
Basophils Absolute: 0 10*3/uL (ref 0.0–0.1)
Basophils Relative: 0 %
Eosinophils Absolute: 0.1 10*3/uL (ref 0.0–0.5)
Eosinophils Relative: 0 %
HCT: 40.9 % (ref 36.0–46.0)
Hemoglobin: 13.4 g/dL (ref 12.0–15.0)
Immature Granulocytes: 0 %
Lymphocytes Relative: 14 %
Lymphs Abs: 2.1 10*3/uL (ref 0.7–4.0)
MCH: 25.8 pg — ABNORMAL LOW (ref 26.0–34.0)
MCHC: 32.8 g/dL (ref 30.0–36.0)
MCV: 78.8 fL — ABNORMAL LOW (ref 80.0–100.0)
Monocytes Absolute: 0.9 10*3/uL (ref 0.1–1.0)
Monocytes Relative: 6 %
Neutro Abs: 12.6 10*3/uL — ABNORMAL HIGH (ref 1.7–7.7)
Neutrophils Relative %: 80 %
Platelets: 322 10*3/uL (ref 150–400)
RBC: 5.19 MIL/uL — ABNORMAL HIGH (ref 3.87–5.11)
RDW: 14.4 % (ref 11.5–15.5)
WBC: 15.8 10*3/uL — ABNORMAL HIGH (ref 4.0–10.5)
nRBC: 0 % (ref 0.0–0.2)

## 2022-08-24 LAB — LIPASE, BLOOD: Lipase: <10 U/L — ABNORMAL LOW (ref 11–51)

## 2022-08-24 LAB — PREGNANCY, URINE: Preg Test, Ur: NEGATIVE

## 2022-08-24 MED ORDER — OXYCODONE-ACETAMINOPHEN 10-325 MG PO TABS
1.0000 | ORAL_TABLET | Freq: Four times a day (QID) | ORAL | 0 refills | Status: DC | PRN
  Filled 2022-08-24: qty 20, 5d supply, fill #0

## 2022-08-24 MED ORDER — SODIUM CHLORIDE 0.9 % IV BOLUS
1000.0000 mL | Freq: Once | INTRAVENOUS | Status: AC
  Administered 2022-08-24: 1000 mL via INTRAVENOUS

## 2022-08-24 MED ORDER — TAMSULOSIN HCL 0.4 MG PO CAPS
0.4000 mg | ORAL_CAPSULE | Freq: Once | ORAL | Status: AC
  Administered 2022-08-24: 0.4 mg via ORAL
  Filled 2022-08-24: qty 1

## 2022-08-24 MED ORDER — IOHEXOL 300 MG/ML  SOLN
100.0000 mL | Freq: Once | INTRAMUSCULAR | Status: AC | PRN
  Administered 2022-08-24: 100 mL via INTRAVENOUS

## 2022-08-24 MED ORDER — FENTANYL CITRATE PF 50 MCG/ML IJ SOSY
100.0000 ug | PREFILLED_SYRINGE | Freq: Once | INTRAMUSCULAR | Status: AC
  Administered 2022-08-24: 100 ug via INTRAVENOUS
  Filled 2022-08-24: qty 2

## 2022-08-24 MED ORDER — ONDANSETRON HCL 4 MG/2ML IJ SOLN
4.0000 mg | Freq: Once | INTRAMUSCULAR | Status: AC
  Administered 2022-08-24: 4 mg via INTRAVENOUS
  Filled 2022-08-24: qty 2

## 2022-08-24 MED ORDER — TAMSULOSIN HCL 0.4 MG PO CAPS
ORAL_CAPSULE | ORAL | 0 refills | Status: DC
  Filled 2022-08-24: qty 30, 30d supply, fill #0

## 2022-08-24 MED ORDER — ONDANSETRON 8 MG PO TBDP
8.0000 mg | ORAL_TABLET | Freq: Three times a day (TID) | ORAL | 20 refills | Status: DC | PRN
  Filled 2022-08-24: qty 10, 4d supply, fill #0

## 2022-08-24 NOTE — ED Notes (Signed)
Pt given discharge instructions and reviewed prescriptions. Opportunities given for questions. Pt verbalizes understanding. PIV removed x1. Maxi Rodas R, RN 

## 2022-08-24 NOTE — ED Triage Notes (Signed)
Dull mid abdominal pain started early am with nausea and vomited x 2, denies diarrhea. 8/10 pain, denies fever, or dysuria. Last  BM PTA, normal

## 2022-08-24 NOTE — ED Provider Notes (Signed)
[facility] Provider Note: April Dell, MD, FACEP  CSN: [id number] MRN: [id number] ARRIVAL: 08/24/22 at 0333 ROOM: DB011/DB011   CHIEF COMPLAINT  Abdominal Pain   HISTORY OF PRESENT ILLNESS  08/24/22 4:05 AM April Gonzales is a 47 y.o. female with dull mid abdominal pain, radiating to the right, that started earlier this morning with associated nausea and vomiting (2 episodes).  She rates her pain as an 8 out of 10.  She denies fever, diarrhea or urinary symptoms.  Pain is similar to previous pancreatitis.   Past Medical History:  Diagnosis Date   Anemia    Anxiety    B12 deficiency    Back pain    Celiac disease    Chronic fatigue syndrome    Constipation    Depression    DM (diabetes mellitus) (HCC)    Gallbladder problem    Heartburn    Hypertension    Joint pain    Pancreatitis    Psoriasis    SOBOE (shortness of breath on exertion)    Vitamin D deficiency     Past Surgical History:  Procedure Laterality Date   CHOLECYSTECTOMY     WISDOM TOOTH EXTRACTION      Family History  Problem Relation Age of Onset   Hyperlipidemia Mother    Diabetes Mother    Diabetes Mellitus II Mother    Hypertension Mother    Obesity Mother    Obesity Father    Hypertension Father    CAD Father    Chronic Renal Failure Father    Hyperlipidemia Father    Heart disease Father    Kidney disease Father    Alcoholism Father     Social History   Tobacco Use   Smoking status: Never   Smokeless tobacco: Never  Substance Use Topics   Alcohol use: No   Drug use: No    Prior to Admission medications   Medication Sig Start Date End Date Taking? Authorizing Provider  ondansetron (ZOFRAN-ODT) 8 MG disintegrating tablet Take 1 tablet (8 mg total) by mouth every 8 (eight) hours as needed for nausea or vomiting. 08/24/22  Yes Neha Waight, MD  oxyCODONE-acetaminophen (PERCOCET) 10-325 MG tablet Take 1 tablet by mouth every 6 (six) hours as needed for pain (may cause  constipation). 08/24/22  Yes Melinda Gwinner, MD  tamsulosin (FLOMAX) 0.4 MG CAPS capsule Take 1 capsule daily until stone passes. 08/24/22  Yes Kairav Russomanno, MD  busPIRone (BUSPAR) 5 MG tablet Take 5 mg by mouth 2 (two) times daily. 05/10/22   [provider]  Dulaglutide (TRULICITY) 1.5 MG/0.5ML SOPN Inject 1.5 mg into the skin once a week. 08/19/22   Rayburn, Fanny Bien, PA-C  escitalopram (LEXAPRO) 20 MG tablet Take 1 tablet (20 mg total) by mouth daily. 02/26/22   Henson, Vickie L, NP-C  fluconazole (DIFLUCAN) 100 MG tablet Take 100 mg by mouth 3 (three) times a week. 07/08/22   [provider]  glipiZIDE (GLUCOTROL XL) 5 MG 24 hr tablet TAKE 1 TABLET BY MOUTH EVERY DAY WITH BREAKFAST 07/20/22   Henson, Vickie L, NP-C  hydrocortisone-pramoxine (PROCTOFOAM-HC) rectal foam Place 1 applicator rectally 2 (two) times daily. 11/19/21   Henson, Vickie L, NP-C  lisinopril (ZESTRIL) 40 MG tablet Take 1 tablet (40 mg total) by mouth daily. 03/26/22   Henson, Vickie L, NP-C  metFORMIN (GLUCOPHAGE) 1000 MG tablet TAKE 1 TABLET (1,000 MG TOTAL) BY MOUTH TWICE A DAY WITH FOOD 07/28/22   Henson, Vickie L,  NP-C  nystatin cream (MYCOSTATIN) Apply 1 Application topically 2 (two) times daily. 07/08/22   [provider]  pregabalin (LYRICA) 75 MG capsule Take 75 mg by mouth as needed. 06/16/22   [provider]  Risankizumab-rzaa (SKYRIZI) 150 MG/ML SOSY Inject into the skin.    [provider]  Tapinarof (VTAMA) 1 % CREA 1 application Externally Once a day for 30 days    [provider]  triamcinolone cream (KENALOG) 0.1 % Apply 1 application topically 2 (two) times daily as needed (psoriasis).  01/26/18   [provider]  Urea 45 % CREA 1 application as needed Externally Once a day    [provider]  Vitamin D, Ergocalciferol, (DRISDOL) 1.25 MG (50000 UNIT) CAPS capsule Take 1 capsule (50,000 Units total) by mouth 2 (two) times a week. 07/22/22   Rayburn,  Fanny Bien, PA-C    Allergies Dilaudid [hydromorphone hcl], Hydromorphone, and Methotrexate   REVIEW OF SYSTEMS  Negative except as noted here or in the History of Present Illness.   PHYSICAL EXAMINATION  Initial Vital Signs Blood pressure (!) 162/91, pulse 100, temperature 98.2 F (36.8 C), temperature source Oral, resp. rate (!) 22, height 5\' 5"  (1.651 m), weight 136.1 kg, last menstrual period 07/29/2022, SpO2 97 %.  Examination General: Well-developed, high BMI female in no acute distress; appearance consistent with age of record HENT: normocephalic; atraumatic Eyes: Normal appearance Neck: supple Heart: regular rate and rhythm Lungs: clear to auscultation bilaterally Abdomen: soft; nondistended; epigastric and right upper quadrant tenderness; bowel sounds present Extremities: No deformity; full range of motion; pulses normal Neurologic: Awake, alert and oriented; motor function intact in all extremities and symmetric; no facial droop Skin: Warm and dry; psoriatic plaque left lower leg Psychiatric: Normal mood and affect   RESULTS  Summary of this visit's results, reviewed and interpreted by myself:   EKG Interpretation  Date/Time:    Ventricular Rate:    PR Interval:    QRS Duration:   QT Interval:    QTC Calculation:   R Axis:     Text Interpretation:         Laboratory Studies: Results for orders placed or performed during the hospital encounter of 08/24/22 (from the past 24 hour(s))  Lipase, blood     Status: Abnormal   Collection Time: 08/24/22  3:54 AM  Result Value Ref Range   Lipase <10 (L) 11 - 51 U/L  Comprehensive metabolic panel     Status: Abnormal   Collection Time: 08/24/22  3:54 AM  Result Value Ref Range   Sodium 133 (L) 135 - 145 mmol/L   Potassium 4.1 3.5 - 5.1 mmol/L   Chloride 99 98 - 111 mmol/L   CO2 21 (L) 22 - 32 mmol/L   Glucose, Bld 244 (H) 70 - 99 mg/dL   BUN 17 6 - 20 mg/dL   Creatinine, Ser 1.61 (H) 0.44 - 1.00 mg/dL    Calcium 9.0 8.9 - 09.6 mg/dL   Total Protein 7.9 6.5 - 8.1 g/dL   Albumin 3.7 3.5 - 5.0 g/dL   AST 20 15 - 41 U/L   ALT 27 0 - 44 U/L   Alkaline Phosphatase 89 38 - 126 U/L   Total Bilirubin 0.6 0.3 - 1.2 mg/dL   GFR, Estimated >04 >54 mL/min   Anion gap 13 5 - 15  Pregnancy, urine     Status: None   Collection Time: 08/24/22  3:54 AM  Result Value Ref Range  Preg Test, Ur NEGATIVE NEGATIVE  CBC with Differential     Status: Abnormal   Collection Time: 08/24/22  4:08 AM  Result Value Ref Range   WBC 15.8 (H) 4.0 - 10.5 K/uL   RBC 5.19 (H) 3.87 - 5.11 MIL/uL   Hemoglobin 13.4 12.0 - 15.0 g/dL   HCT 16.1 09.6 - 04.5 %   MCV 78.8 (L) 80.0 - 100.0 fL   MCH 25.8 (L) 26.0 - 34.0 pg   MCHC 32.8 30.0 - 36.0 g/dL   RDW 40.9 81.1 - 91.4 %   Platelets 322 150 - 400 K/uL   nRBC 0.0 0.0 - 0.2 %   Neutrophils Relative % 80 %   Neutro Abs 12.6 (H) 1.7 - 7.7 K/uL   Lymphocytes Relative 14 %   Lymphs Abs 2.1 0.7 - 4.0 K/uL   Monocytes Relative 6 %   Monocytes Absolute 0.9 0.1 - 1.0 K/uL   Eosinophils Relative 0 %   Eosinophils Absolute 0.1 0.0 - 0.5 K/uL   Basophils Relative 0 %   Basophils Absolute 0.0 0.0 - 0.1 K/uL   Immature Granulocytes 0 %   Abs Immature Granulocytes 0.07 0.00 - 0.07 K/uL  Urinalysis, Routine w reflex microscopic -Urine, Clean Catch     Status: Abnormal   Collection Time: 08/24/22  5:28 AM  Result Value Ref Range   Color, Urine YELLOW YELLOW   APPearance CLEAR CLEAR   Specific Gravity, Urine >1.046 (H) 1.005 - 1.030   pH 6.0 5.0 - 8.0   Glucose, UA 500 (A) NEGATIVE mg/dL   Hgb urine dipstick NEGATIVE NEGATIVE   Bilirubin Urine NEGATIVE NEGATIVE   Ketones, ur NEGATIVE NEGATIVE mg/dL   Protein, ur TRACE (A) NEGATIVE mg/dL   Nitrite NEGATIVE NEGATIVE   Leukocytes,Ua NEGATIVE NEGATIVE   RBC / HPF 0-5 0 - 5 RBC/hpf   WBC, UA 0-5 0 - 5 WBC/hpf   Bacteria, UA NONE SEEN NONE SEEN   Squamous Epithelial / HPF 0-5 0 - 5 /HPF   Mucus PRESENT    Hyaline Casts, UA  PRESENT    Imaging Studies: CT ABDOMEN PELVIS W CONTRAST  Result Date: 08/24/2022 CLINICAL DATA:  Epigastric pain with nausea and vomiting EXAM: CT ABDOMEN AND PELVIS WITH CONTRAST TECHNIQUE: Multidetector CT imaging of the abdomen and pelvis was performed using the standard protocol following bolus administration of intravenous contrast. RADIATION DOSE REDUCTION: This exam was performed according to the departmental dose-optimization program which includes automated exposure control, adjustment of the mA and/or kV according to patient size and/or use of iterative reconstruction technique. CONTRAST:  OMNIPAQUE IOHEXOL 300 MG/ML  SOLN COMPARISON:  02/20/2016 FINDINGS: Lower chest:  No contributory findings. Hepatobiliary: No focal liver abnormality.Cholecystectomy. No biliary dilatation. Pancreas: Unremarkable. Spleen: Unremarkable. Adrenals/Urinary Tract: Negative adrenals. 8 mm right UPJ calculus with mild hydronephrosis and perinephric stranding. 5 mm intrarenal calculus on the right. Unremarkable bladder. Stomach/Bowel:  No obstruction. No visible bowel inflammation. Vascular/Lymphatic: No acute vascular abnormality. Enlarged lymph nodes in the deep liver drainage, usually reactive. Reproductive:No pathologic findings. Other: No ascites or pneumoperitoneum. Small fatty umbilical hernia. Musculoskeletal: No acute abnormalities. Bridging osteophytes with diffuse spinal ankylosis. IMPRESSION: 1. Obstructing 8 mm right UPJ calculus. 2. Right nephrolithiasis. Electronically Signed   By: Tiburcio Pea M.D.   On: 08/24/2022 05:25    ED COURSE and MDM  Nursing notes, initial and subsequent vitals signs, including pulse oximetry, reviewed and interpreted by myself.  Vitals:   08/24/22 0500 08/24/22 0517 08/24/22 0530  08/24/22 0545  BP: 138/87 (!) 169/76 (!) 166/114 (!) 176/90  Pulse: 81 72 83 83  Resp: 18 18 20    Temp:      TempSrc:      SpO2: 94% 100% 95% 92%  Weight:      Height:        Medications  sodium chloride 0.9 % bolus 1,000 mL (0 mLs Intravenous Stopped 08/24/22 0511)  ondansetron (ZOFRAN) injection 4 mg (4 mg Intravenous Given 08/24/22 0425)  fentaNYL (SUBLIMAZE) injection 100 mcg (100 mcg Intravenous Given 08/24/22 0423)  iohexol (OMNIPAQUE) 300 MG/ML solution 100 mL (100 mLs Intravenous Contrast Given 08/24/22 0504)  fentaNYL (SUBLIMAZE) injection 100 mcg (100 mcg Intravenous Given 08/24/22 0540)  tamsulosin (FLOMAX) capsule 0.4 mg (0.4 mg Oral Given 08/24/22 0543)  sodium chloride 0.9 % bolus 1,000 mL (1,000 mLs Intravenous New Bag/Given 08/24/22 0554)   5:39 AM CT shows a large right proximal ureteral stone as well as a stone within the right kidney itself.  Urinalysis is pending.  5:59 AM Radiologist confirms 8 mm obstructing right UPJ stone.  The patient was advised this will likely require urologic intervention.   PROCEDURES  Procedures   ED DIAGNOSES     ICD-10-CM   1. Ureterolithiasis  N20.1          Girtrude Enslin, MD 08/24/22 [phone]

## 2022-08-27 ENCOUNTER — Other Ambulatory Visit: Payer: Self-pay | Admitting: Urology

## 2022-08-31 ENCOUNTER — Other Ambulatory Visit: Payer: Self-pay | Admitting: Urology

## 2022-08-31 ENCOUNTER — Encounter (INDEPENDENT_AMBULATORY_CARE_PROVIDER_SITE_OTHER): Payer: Self-pay

## 2022-08-31 ENCOUNTER — Ambulatory Visit (INDEPENDENT_AMBULATORY_CARE_PROVIDER_SITE_OTHER): Payer: Commercial Managed Care - HMO | Admitting: Physician Assistant

## 2022-08-31 IMAGING — MG MM DIGITAL SCREENING BILAT W/ TOMO AND CAD
6 of 10 series · 6 of 30 positions shown · non-contrast
Comparison: Previous exam(s).

ACR Breast Density Category a: The breast tissue is almost entirely
fatty.

CLINICAL DATA: Screening.

EXAM:
DIGITAL SCREENING BILATERAL MAMMOGRAM WITH TOMOSYNTHESIS AND CAD
TECHNIQUE: Bilateral screening digital craniocaudal and mediolateral oblique
mammograms were obtained. Bilateral screening digital breast
tomosynthesis was performed. The images were evaluated with
computer-aided detection.

[R MLO synth-2D]
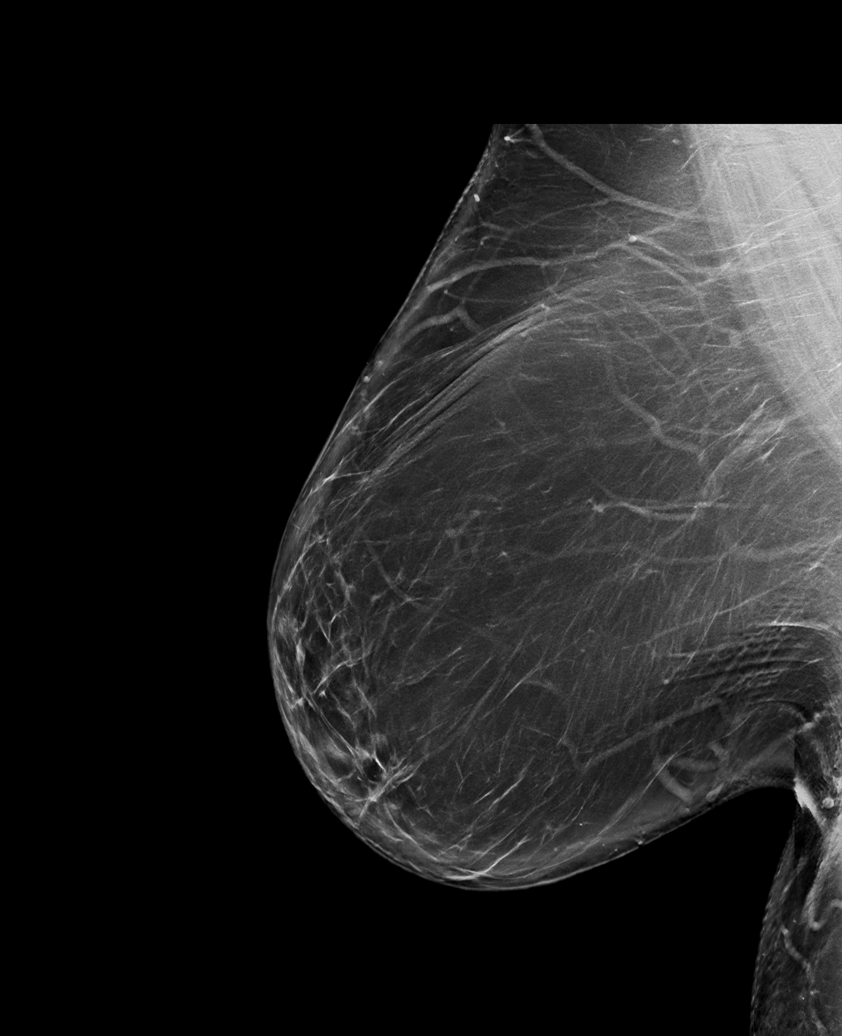

[L MLO synth-2D (1 of 2)]
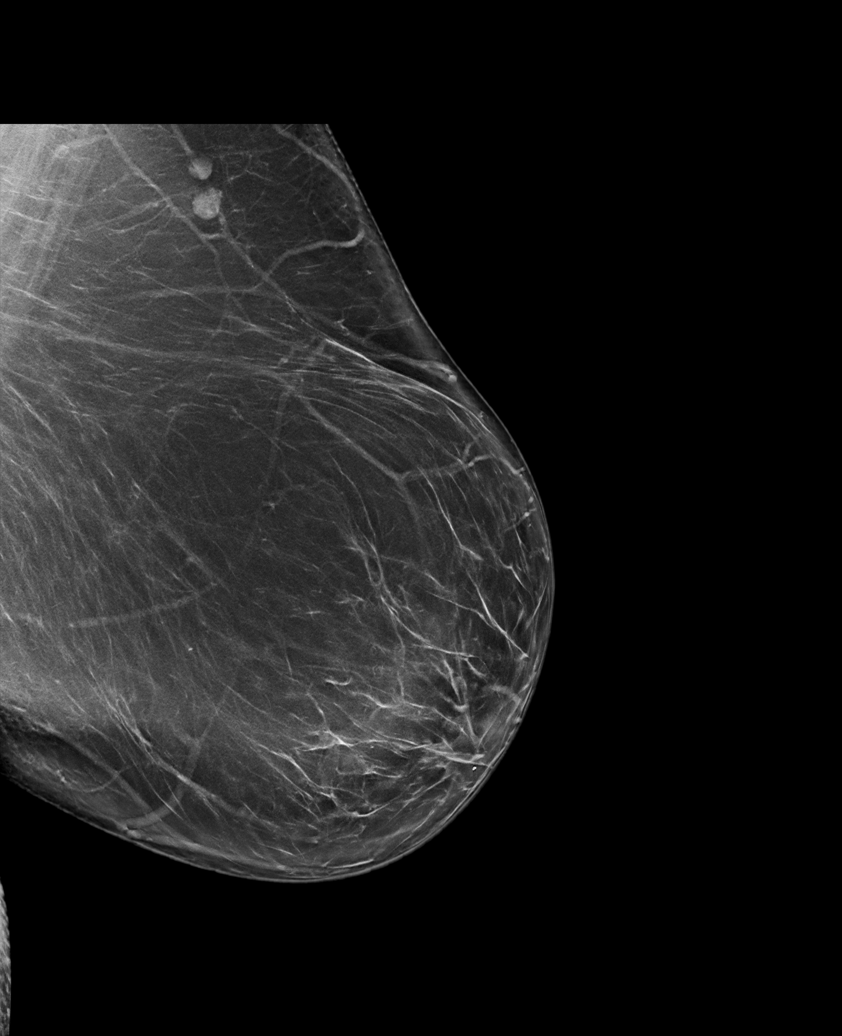

[R CC synth-2D]
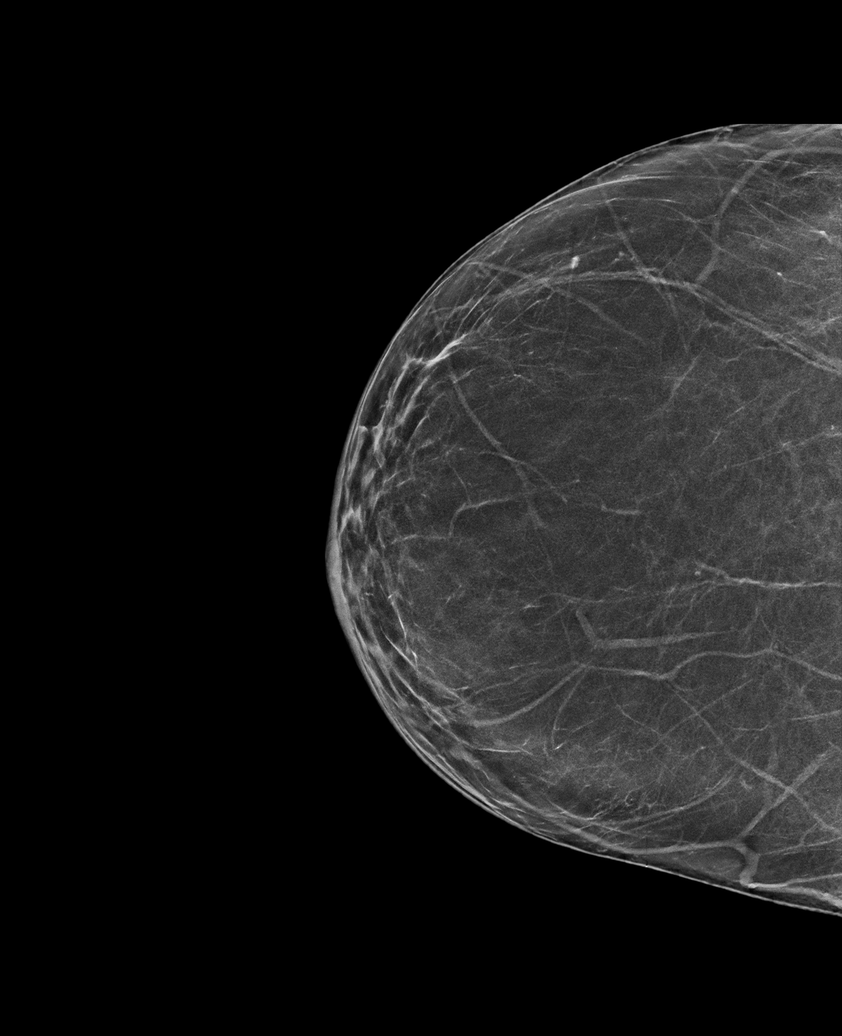

[L MLO synth-2D (2 of 2)]
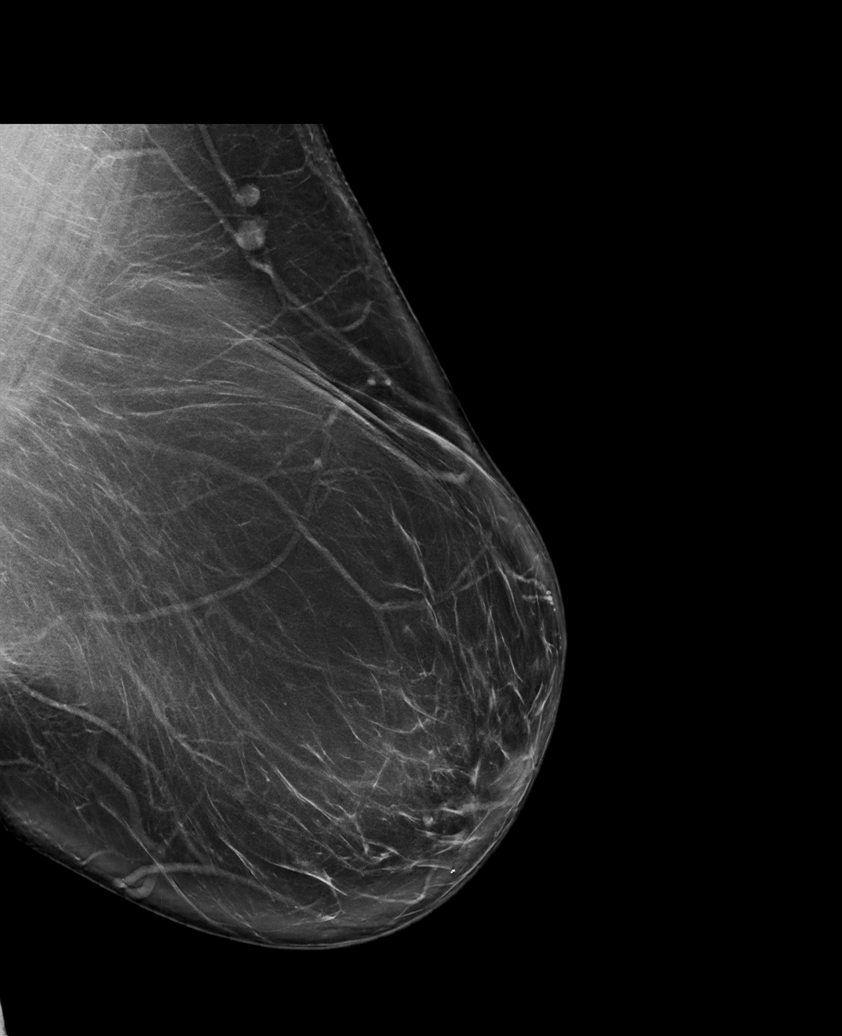

[L CC synth-2D]
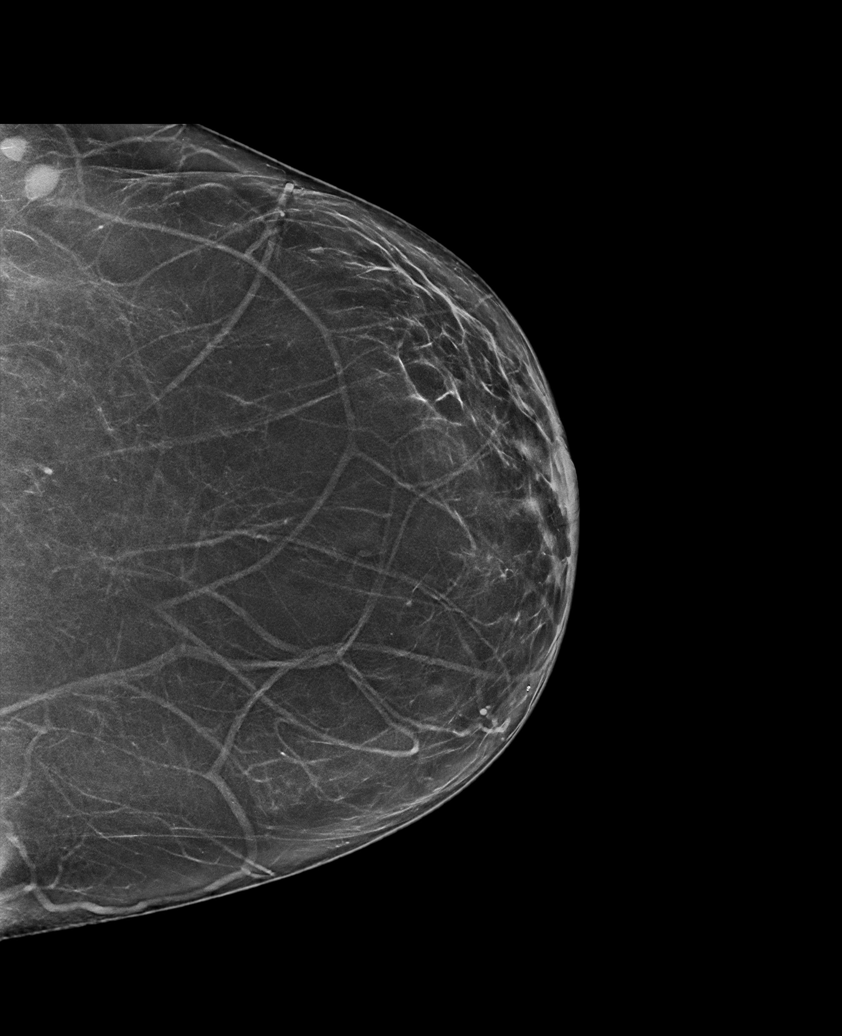

[R CC tomo · tomo slice 31/60.0]
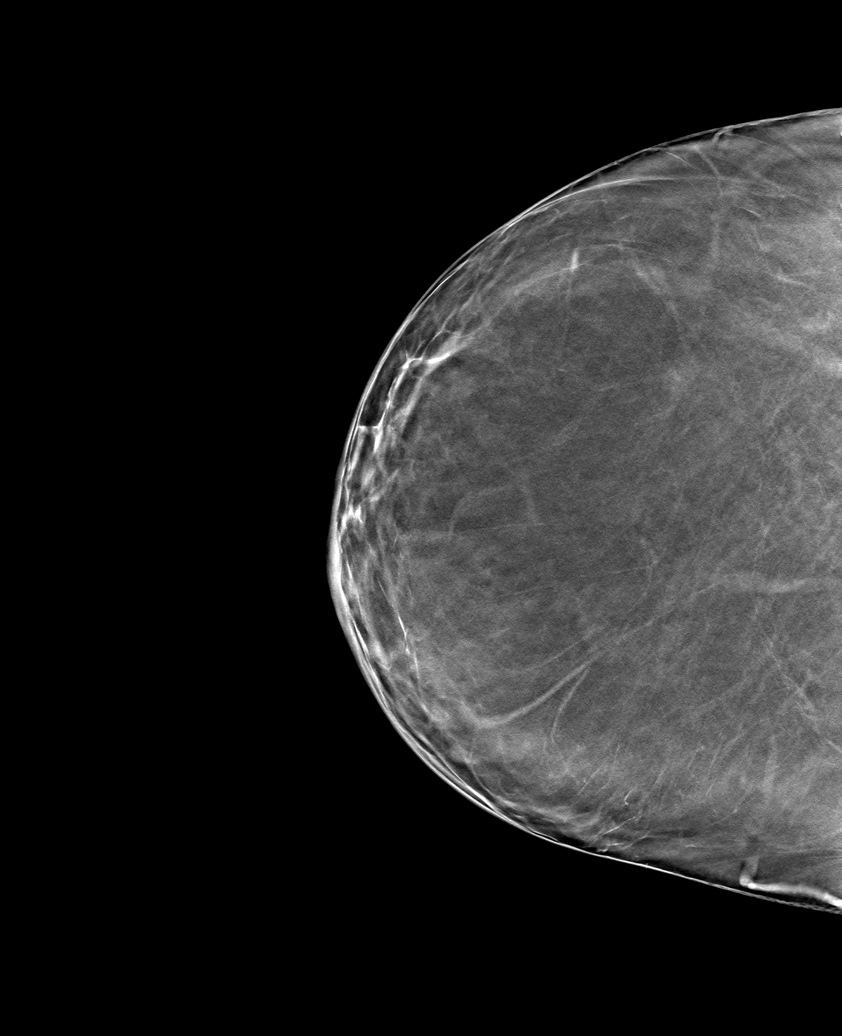

[6 of 30 positions shown; findings below may reference images not displayed]

FINDINGS: There are no findings suspicious for malignancy.
IMPRESSION: No mammographic evidence of malignancy. A result letter of this
screening mammogram will be mailed directly to the patient.

RECOMMENDATION:
Screening mammogram in one year. (Code:0E-3-N98)

BI-RADS CATEGORY  1: Negative.

## 2022-09-02 ENCOUNTER — Ambulatory Visit: Payer: Commercial Managed Care - HMO | Admitting: Nutrition

## 2022-09-02 ENCOUNTER — Ambulatory Visit: Payer: Commercial Managed Care - HMO | Admitting: Nurse Practitioner

## 2022-09-02 ENCOUNTER — Other Ambulatory Visit (HOSPITAL_BASED_OUTPATIENT_CLINIC_OR_DEPARTMENT_OTHER): Payer: Self-pay

## 2022-09-03 ENCOUNTER — Ambulatory Visit (HOSPITAL_BASED_OUTPATIENT_CLINIC_OR_DEPARTMENT_OTHER): Admit: 2022-09-03 | Payer: Commercial Managed Care - HMO | Admitting: Urology

## 2022-09-03 ENCOUNTER — Encounter (HOSPITAL_BASED_OUTPATIENT_CLINIC_OR_DEPARTMENT_OTHER): Payer: Self-pay

## 2022-09-03 SURGERY — LITHOTRIPSY, ESWL
Anesthesia: LOCAL | Laterality: Right

## 2022-09-03 NOTE — Patient Instructions (Signed)
SURGICAL WAITING ROOM VISITATION Patients having surgery or a procedure may have no more than 2 support people in the waiting area - these visitors may rotate.    Children under the age of 30 must have an adult with them who is not the patient.  If the patient needs to stay at the hospital during part of their recovery, the visitor guidelines for inpatient rooms apply. Pre-op nurse will coordinate an appropriate time for 1 support person to accompany patient in pre-op.  This support person may not rotate.    Please refer to the [facility] website for the visitor guidelines for Inpatients (after your surgery is over and you are in a regular room).       Your procedure is scheduled on: 09-21-22   Report to [facility] Main Entrance    Report to admitting at 10:15 AM   Call this number if you have problems the morning of surgery [phone]   Do not eat food or drink liquids :After Midnight.           If you have questions, please contact your surgeon's office.   FOLLOW  ANY ADDITIONAL PRE OP INSTRUCTIONS YOU RECEIVED FROM YOUR SURGEON'S OFFICE!!!     Oral Hygiene is also important to reduce your risk of infection.                                    Remember - BRUSH YOUR TEETH THE MORNING OF SURGERY WITH YOUR REGULAR TOOTHPASTE   Do NOT smoke after Midnight   Take these medicines the morning of surgery with A SIP OF WATER:   Buspirone  Escitalopram  Pregabalin  Tamsulosin  Ondansetron and Oxycodone if needed  How to Manage Your Diabetes Before and After Surgery  Why is it important to control my blood sugar before and after surgery? Improving blood sugar levels before and after surgery helps healing and can limit problems. A way of improving blood sugar control is eating a healthy diet by:  Eating less sugar and carbohydrates  Increasing activity/exercise  Talking with your doctor about reaching your blood sugar goals High blood sugars (greater than 180 mg/dL)  can raise your risk of infections and slow your recovery, so you will need to focus on controlling your diabetes during the weeks before surgery. Make sure that the doctor who takes care of your diabetes knows about your planned surgery including the date and location.  How do I manage my blood sugar before surgery? Check your blood sugar at least 4 times a day, starting 2 days before surgery, to make sure that the level is not too high or low. Check your blood sugar the morning of your surgery when you wake up and every 2 hours until you get to the Short Stay unit. If your blood sugar is less than 70 mg/dL, you will need to treat for low blood sugar: Do not take insulin. Treat a low blood sugar (less than 70 mg/dL) with  cup of clear juice (cranberry or apple), 4 glucose tablets, OR glucose gel. Recheck blood sugar in 15 minutes after treatment (to make sure it is greater than 70 mg/dL). If your blood sugar is not greater than 70 mg/dL on recheck, call [phone] for further instructions. Report your blood sugar to the short stay nurse when you get to Short Stay.  If you are admitted to the hospital after surgery: Your  blood sugar will be checked by the staff and you will probably be given insulin after surgery (instead of oral diabetes medicines) to make sure you have good blood sugar levels. The goal for blood sugar control after surgery is 80-180 mg/dL.   WHAT DO I DO ABOUT MY DIABETES MEDICATION?  Do not take oral diabetes medicines (pills) the morning of surgery.  Hold Trulicity 7 days before surgery (do not take after 09-13-22)        Do not take an evening dose of Glipizide the night before surgery   DO NOT TAKE THE FOLLOWING 7 DAYS PRIOR TO SURGERY: Ozempic, Wegovy, Rybelsus (Semaglutide), Byetta (exenatide), Bydureon (exenatide ER), Victoza, Saxenda (liraglutide), or Trulicity (dulaglutide) Mounjaro (Tirzepatide) Adlyxin (Lixisenatide), Polyethylene Glycol  Loxenatide.   Reviewed and Endorsed by [facility] Patient Education Committee, August 2015  Bring CPAP mask and tubing day of surgery.                              You may not have any metal on your body including hair pins, jewelry, and body piercing             Do not wear make-up, lotions, powders, perfumes or deodorant  Do not wear nail polish including gel and S&S, artificial/acrylic nails, or any other type of covering on natural nails including finger and toenails. If you have artificial nails, gel coating, etc. that needs to be removed by a nail salon please have this removed prior to surgery or surgery may need to be canceled/ delayed if the surgeon/ anesthesia feels like they are unable to be safely monitored.   Do not shave  48 hours prior to surgery.     Do not bring valuables to the hospital. [facility] IS NOT RESPONSIBLE   FOR VALUABLES.   Contacts, dentures or bridgework may not be worn into surgery.  DO NOT BRING YOUR HOME MEDICATIONS TO THE HOSPITAL. PHARMACY WILL DISPENSE MEDICATIONS LISTED ON YOUR MEDICATION LIST TO YOU DURING YOUR ADMISSION IN THE HOSPITAL!    Patients discharged on the day of surgery will not be allowed to drive home.  Someone NEEDS to stay with you for the first 24 hours after anesthesia.   Special Instructions: Bring a copy of your healthcare power of attorney and living will documents the day of surgery if you haven't scanned them before.              Please read over the following fact sheets you were given: IF YOU HAVE QUESTIONS ABOUT YOUR PRE-OP INSTRUCTIONS PLEASE CALL [phone] [place]   If you received a COVID test during your pre-op visit  it is requested that you wear a mask when out in public, stay away from anyone that may not be feeling well and notify your surgeon if you develop symptoms. If you test positive for Covid or have been in contact with anyone that has tested positive in the last 10 days please notify you surgeon.  [facility] - Preparing for Surgery Before surgery, you can play an important role.  Because skin is not sterile, your skin needs to be as free of germs as possible.  You can reduce the number of germs on your skin by washing with CHG (chlorahexidine gluconate) soap before surgery.  CHG is an antiseptic cleaner which kills germs and bonds with the skin to continue killing germs even after washing. Please DO NOT use if you  have an allergy to CHG or antibacterial soaps.  If your skin becomes reddened/irritated stop using the CHG and inform your nurse when you arrive at Short Stay. Do not shave (including legs and underarms) for at least 48 hours prior to the first CHG shower.  You may shave your face/neck.  Please follow these instructions carefully:  1.  Shower with CHG Soap the night before surgery and the  morning of surgery.  2.  If you choose to wash your hair, wash your hair first as usual with your normal  shampoo.  3.  After you shampoo, rinse your hair and body thoroughly to remove the shampoo.                             4.  Use CHG as you would any other liquid soap.  You can apply chg directly to the skin and wash.  Gently with a scrungie or clean washcloth.  5.  Apply the CHG Soap to your body ONLY FROM THE NECK DOWN.   Do   not use on face/ open                           Wound or open sores. Avoid contact with eyes, ears mouth and   genitals (private parts).                       Wash face,  Genitals (private parts) with your normal soap.             6.  Wash thoroughly, paying special attention to the area where your    surgery  will be performed.  7.  Thoroughly rinse your body with warm water from the neck down.  8.  DO NOT shower/wash with your normal soap after using and rinsing off the CHG Soap.                9.  Pat yourself dry with a clean towel.            10.  Wear clean pajamas.            11.  Place clean sheets on your bed the night of your first shower and do not  sleep with  pets. Day of Surgery : Do not apply any lotions/deodorants the morning of surgery.  Please wear clean clothes to the hospital/surgery center.  FAILURE TO FOLLOW THESE INSTRUCTIONS MAY RESULT IN THE CANCELLATION OF YOUR SURGERY  PATIENT SIGNATURE_________________________________  NURSE SIGNATURE__________________________________  ________________________________________________________________________

## 2022-09-03 NOTE — Progress Notes (Signed)
COVID Vaccine Completed:  Date of COVID positive in last 90 days:  PCP - Hetty Blend, NP-C Cardiologist -  Neurologist - Melvyn Novas, MD  Chest x-ray -  EKG - 05-19-22 Epic Stress Test -  ECHO -  Cardiac Cath -  Pacemaker/ICD device last checked: Spinal Cord Stimulator:  Bowel Prep -   Sleep Study -  CPAP -   Fasting Blood Sugar -  Checks Blood Sugar _____ times a day  Trulicity Last dose of GLP1 agonist-  N/A GLP1 instructions:  Hold x7 days (do not take after 09-13-22)   Last dose of SGLT-2 inhibitors-  N/A SGLT-2 instructions: N/A   Blood Thinner Instructions:  Time Aspirin Instructions: Last Dose:  Activity level:  Can go up a flight of stairs and perform activities of daily living without stopping and without symptoms of chest pain or shortness of breath.  Able to exercise without symptoms  Unable to go up a flight of stairs without symptoms of     Anesthesia review:   Patient denies shortness of breath, fever, cough and chest pain at PAT appointment  Patient verbalized understanding of instructions that were given to them at the PAT appointment. Patient was also instructed that they will need to review over the PAT instructions again at home before surgery.

## 2022-09-07 ENCOUNTER — Other Ambulatory Visit (INDEPENDENT_AMBULATORY_CARE_PROVIDER_SITE_OTHER): Payer: Self-pay | Admitting: Physician Assistant

## 2022-09-07 DIAGNOSIS — E559 Vitamin D deficiency, unspecified: Secondary | ICD-10-CM

## 2022-09-08 ENCOUNTER — Other Ambulatory Visit: Payer: Self-pay

## 2022-09-08 ENCOUNTER — Encounter (HOSPITAL_COMMUNITY)
Admission: RE | Admit: 2022-09-08 | Discharge: 2022-09-08 | Disposition: A | Discharge status: Home / Self Care | Payer: Commercial Managed Care - HMO | Source: Ambulatory Visit | Attending: Urology | Admitting: Urology

## 2022-09-08 ENCOUNTER — Encounter (HOSPITAL_COMMUNITY): Payer: Self-pay

## 2022-09-08 VITALS — BP 182/100 | HR 83 | Temp 98.4°F | Resp 16 | Ht 65.0 in | Wt 300.0 lb

## 2022-09-08 DIAGNOSIS — Z01812 Encounter for preprocedural laboratory examination: Secondary | ICD-10-CM | POA: Insufficient documentation

## 2022-09-08 DIAGNOSIS — Z7984 Long term (current) use of oral hypoglycemic drugs: Secondary | ICD-10-CM | POA: Insufficient documentation

## 2022-09-08 DIAGNOSIS — K900 Celiac disease: Secondary | ICD-10-CM | POA: Insufficient documentation

## 2022-09-08 DIAGNOSIS — E119 Type 2 diabetes mellitus without complications: Secondary | ICD-10-CM | POA: Diagnosis not present

## 2022-09-08 DIAGNOSIS — N201 Calculus of ureter: Secondary | ICD-10-CM | POA: Diagnosis not present

## 2022-09-08 DIAGNOSIS — I10 Essential (primary) hypertension: Secondary | ICD-10-CM | POA: Insufficient documentation

## 2022-09-08 HISTORY — DX: Unspecified osteoarthritis, unspecified site: M19.90

## 2022-09-08 HISTORY — DX: Personal history of urinary calculi: Z87.442

## 2022-09-08 HISTORY — DX: Gastro-esophageal reflux disease without esophagitis: K21.9

## 2022-09-08 LAB — HEMOGLOBIN A1C
Hgb A1c MFr Bld: 10.8 % — ABNORMAL HIGH (ref 4.8–5.6)
Mean Plasma Glucose: 263.26 mg/dL

## 2022-09-08 LAB — GLUCOSE, CAPILLARY: Glucose-Capillary: 268 mg/dL — ABNORMAL HIGH (ref 70–99)

## 2022-09-09 ENCOUNTER — Ambulatory Visit: Payer: Commercial Managed Care - HMO | Admitting: Nurse Practitioner

## 2022-09-09 ENCOUNTER — Encounter: Payer: Self-pay | Admitting: Nurse Practitioner

## 2022-09-09 VITALS — BP 146/96 | HR 75 | Ht 65.0 in | Wt 302.6 lb

## 2022-09-09 DIAGNOSIS — E1165 Type 2 diabetes mellitus with hyperglycemia: Secondary | ICD-10-CM

## 2022-09-09 DIAGNOSIS — Z7985 Long-term (current) use of injectable non-insulin antidiabetic drugs: Secondary | ICD-10-CM

## 2022-09-09 DIAGNOSIS — Z7984 Long term (current) use of oral hypoglycemic drugs: Secondary | ICD-10-CM

## 2022-09-09 DIAGNOSIS — I10 Essential (primary) hypertension: Secondary | ICD-10-CM | POA: Diagnosis not present

## 2022-09-09 MED ORDER — BASAGLAR KWIKPEN 100 UNIT/ML ~~LOC~~ SOPN: 30.0000 [IU] | PEN_INJECTOR | Freq: Every day | SUBCUTANEOUS | 3 refills | Status: DC

## 2022-09-09 MED ORDER — PEN NEEDLES 31G X 8 MM MISC: 3 refills | Status: DC

## 2022-09-09 NOTE — Patient Instructions (Signed)
Insulin Injection Instructions, Using Insulin Pens, Adult There are many different types of insulin. The type of insulin that you take may determine how many injections you give yourself and when you need to give the injections. Supplies needed: Soap and water. Your insulin pen. A new needle. Alcohol wipes. A disposal container for sharp items (sharps container), such as an empty plastic bottle with a cover. How to choose a site for injection The body absorbs insulin differently, depending on where the insulin is injected (injection site). It is best to inject insulin into the same body area each time; for example, always in the abdomen. However, you should use a different spot in that area for each injection. Do not inject the insulin in the same spot each time. There are five main areas that can be used for injecting. These areas are: Abdomen. This is the preferred area. Front of thigh. Upper, outer side of thigh. Upper, outer side of arm. Upper, outer part of buttock. How to use an insulin pen  Get ready Wash your hands with soap and water for at least 20 seconds. If soap and water are not available, use hand sanitizer. Test your blood sugar (glucose) level and write down that number. Follow any instructions from your health care provider about what to do if your blood glucose level is higher or lower than your normal range. Make sure the insulin pen has the right kind of insulin and there is enough insulin in the pen for the dose. Check the expiration date. If you are using CLEAR insulin, check to see that it is clear and free of clumps. If you are using CLOUDY insulin, mix it by gently rolling the insulin pen between your palms several times. Do not shake the pen. Remove the cap from the insulin pen. Use an alcohol wipe to clean the rubber tip of the pen. Remove the protective paper tab from the disposable needle. Do not let the needle touch anything. Screw a new, unused needle onto  the pen. Remove the outer plastic needle cover. Do not throw away the outer plastic cover yet. If the pen uses a special safety needle, leave the inner needle shield in place. If the pen does not use a special safety needle, remove the inner plastic cover from the needle. If you skip this step, you may not get the right amount of insulin. Follow the manufacturer's instructions to prime the insulin pen with the volume of insulin needed. Hold the pen with the needle pointing up, and push the button on the opposite end of the pen until a drop of insulin appears at the needle tip. If no insulin appears, repeat this step. Turn the button (dial) to the number of units of insulin that you will be injecting. Inject the insulin Use an alcohol wipe to clean the site where you will be inserting the needle. Let the site air-dry. Grip the base of the pen with a loose fist and rest your thumb on the pen or hold the pen in the palm of your writing hand like a pencil. If directed by your health care provider, use your other hand to pinch and hold about 1 inch (2.5 cm) of skin at the injection site. Do not directly touch the cleaned part of the skin. Gently but quickly, use your writing hand to put the needle straight into the skin. Insert the needle at a 45-degree angle or a 90-degree angle (perpendicular) to the skin, as directed by your health  care provider. When the needle is completely inserted into the skin, let go of the skin that you are pinching. Use your thumb or index finger of your writing hand to push the top button of the pen all the way to inject the insulin. Continue to hold the pen in place with your writing hand. Wait 10 seconds, then pull the needle straight out of the skin. This will allow all of the insulin to go from the pen and needle into your body. Carefully put the outer plastic cover of the needle back over the needle, then unscrew the capped needle and discard it in a sharps container, such  as an empty plastic bottle with a cover. Put the plastic cap back on the insulin pen. How to throw away supplies Discard all used needles in a sharps container. Follow the disposal regulations for the area where you live. Do not use any needle more than one time. Throw away empty disposable pens in the regular trash. Questions to ask your health care provider How often should I be taking insulin? How often should I check my blood glucose? What amount of insulin should I be taking each time? What are the side effects? What should I do if my blood glucose is too high? What should I do if my blood glucose is too low? What should I do if I forget to take my insulin? What number should I call if I have questions? Where to find more information American Diabetes Association (ADA): www.diabetes.org Association of Diabetes Care and Education Specialists (ADCES): www.diabeteseducator.org Summary Before you give yourself an insulin injection, be sure to wash your hands for at least 20 seconds and test your blood glucose level. Write down that number. Check the expiration date and the type of insulin that is in the pen. The type of insulin that you take may determine how many injections you give yourself and when you need to give the injections. It is best to inject insulin into the same body area each time; for example, always in the abdomen. However, you should use a different spot in that area for each injection. Do not use a needle more than one time. This information is not intended to replace advice given to you by your health care provider. Make sure you discuss any questions you have with your health care provider. Document Revised: 06/23/2020 Document Reviewed: 02/02/2020 Elsevier Patient Education  2023 [employer].

## 2022-09-09 NOTE — Progress Notes (Signed)
Endocrinology Follow Up Note       09/09/2022, 9:24 AM   Subjective:    Patient ID: April Gonzales, female    DOB: [birth year].  April Gonzales is being seen in follow up after being seen in consultation for management of currently uncontrolled symptomatic diabetes requested by  Hetty Blend L, NP-C.   Past Medical History:  Diagnosis Date   Anemia    Anxiety    Arthritis    B12 deficiency    Back pain    Celiac disease    Chronic fatigue syndrome    Constipation    Depression    DM (diabetes mellitus) (HCC)    Gallbladder problem    GERD (gastroesophageal reflux disease)    Heartburn    History of kidney stones    Hypertension    Joint pain    Pancreatitis    Psoriasis    SOBOE (shortness of breath on exertion)    Vitamin D deficiency     Past Surgical History:  Procedure Laterality Date   CHOLECYSTECTOMY     WISDOM TOOTH EXTRACTION      Social History   Socioeconomic History   Marital status: Single    Spouse name: Not on file   Number of children: Not on file   Years of education: Not on file   Highest education level: Not on file  Occupational History   Occupation: Currently on leave of absence  Tobacco Use   Smoking status: Never   Smokeless tobacco: Never  Vaping Use   Vaping Use: Never used  Substance and Sexual Activity   Alcohol use: No   Drug use: No   Sexual activity: Not on file  Other Topics Concern   Not on file  Social History Narrative   Not on file   Social Determinants of Health   Financial Resource Strain: Not on file  Food Insecurity: Not on file  Transportation Needs: Not on file  Physical Activity: Not on file  Stress: Not on file  Social Connections: Not on file    Family History  Problem Relation Age of Onset   Hyperlipidemia Mother    Diabetes Mother    Diabetes Mellitus II Mother    Hypertension Mother    Obesity Mother    Obesity Father     Hypertension Father    CAD Father    Chronic Renal Failure Father    Hyperlipidemia Father    Heart disease Father    Kidney disease Father    Alcoholism Father     Outpatient Encounter Medications as of 09/09/2022  Medication Sig   busPIRone (BUSPAR) 5 MG tablet Take 5 mg by mouth 2 (two) times daily.   Dulaglutide (TRULICITY) 1.5 MG/0.5ML SOPN Inject 1.5 mg into the skin once a week.   escitalopram (LEXAPRO) 20 MG tablet Take 1 tablet (20 mg total) by mouth daily.   glipiZIDE (GLUCOTROL XL) 5 MG 24 hr tablet TAKE 1 TABLET BY MOUTH EVERY DAY WITH BREAKFAST   Insulin Glargine (BASAGLAR KWIKPEN) 100 UNIT/ML Inject 30 Units into the skin at bedtime.   Insulin Pen Needle (PEN NEEDLES) 31G X 8 MM MISC Use to inject  insulin once daily   lisinopril (ZESTRIL) 40 MG tablet Take 1 tablet (40 mg total) by mouth daily.   metFORMIN (GLUCOPHAGE) 1000 MG tablet TAKE 1 TABLET (1,000 MG TOTAL) BY MOUTH TWICE A DAY WITH FOOD   ondansetron (ZOFRAN-ODT) 8 MG disintegrating tablet Take 1 tablet (8 mg total) by mouth every 8 (eight) hours as needed for nausea or vomiting.   oxyCODONE-acetaminophen (PERCOCET) 10-325 MG tablet Take 1 tablet by mouth every 6 (six) hours as needed for pain (may cause constipation).   pregabalin (LYRICA) 75 MG capsule Take 75 mg by mouth 2 (two) times daily as needed (pain).   Risankizumab-rzaa (SKYRIZI) 150 MG/ML SOSY Inject 150 mg into the skin every 3 (three) months.   tamsulosin (FLOMAX) 0.4 MG CAPS capsule Take 1 capsule daily until stone passes.   Tapinarof (VTAMA) 1 % CREA Apply 1 Application topically daily as needed (psoriasis).   triamcinolone cream (KENALOG) 0.1 % Apply 1 application topically 2 (two) times daily as needed (psoriasis).    Urea 45 % CREA Apply 1 Application topically daily as needed (irritation).   Vitamin D, Ergocalciferol, (DRISDOL) 1.25 MG (50000 UNIT) CAPS capsule TAKE 1 CAPSULE (50,000 UNITS TOTAL) BY MOUTH TWO TIMES A WEEK    hydrocortisone-pramoxine (PROCTOFOAM-HC) rectal foam Place 1 applicator rectally 2 (two) times daily. (Patient not taking: Reported on 09/07/2022)   No facility-administered encounter medications on file as of 09/09/2022.    ALLERGIES: Allergies  Allergen Reactions   Dilaudid [Hydromorphone Hcl] Nausea And Vomiting   Hydromorphone     Other reaction(s): Unknown   Methotrexate Other (See Comments)    Childhood reaction, foaming at the mouth    VACCINATION STATUS: Immunization History  Administered Date(s) Administered   Influenza,inj,Quad PF,6+ Mos 12/24/2021   Tdap 10/11/2020    Diabetes She presents for her follow-up diabetic visit. She has type 2 diabetes mellitus. Onset time: formally diagnosed at age 47. Her disease course has been improving. There are no hypoglycemic associated symptoms. Associated symptoms include fatigue, polydipsia and polyuria. There are no hypoglycemic complications. Symptoms are stable. There are no diabetic complications. Risk factors for coronary artery disease include diabetes mellitus, dyslipidemia, obesity, hypertension and sedentary lifestyle. Current diabetic treatment includes oral agent (triple therapy) (and Trulicity). She is compliant with treatment most of the time. Her weight is stable. She is following a generally unhealthy diet. When asked about meal planning, she reported none. She has not had a previous visit with a dietitian. She rarely participates in exercise. Her home blood glucose trend is decreasing steadily. Her overall blood glucose range is >200 mg/dl. (She presents today with her meter showing improved, yet still over target glycemic profile.  Her most recent A1c (done as part of preop testing) on 5/22 was 10.8%, improving from last A1c of 12.3%.  She has been dealing with problems regarding kidney stones, scheduled for surgery in June.  Analysis of her meter shows 7-day average of 275, 14-day average of 269, 30-day average of 289, 90-day  average of 302.  She has glucose ranging between 159-419.) An ACE inhibitor/angiotensin II receptor blocker is being taken. She does not see a podiatrist.Eye exam is not current.     Review of systems  Constitutional: + Minimally fluctuating body weight, current Body mass index is 50.36 kg/m., no fatigue, no subjective hyperthermia, no subjective hypothermia Eyes: no blurry vision, no xerophthalmia ENT: no sore throat, no nodules palpated in throat, no dysphagia/odynophagia, no hoarseness Cardiovascular: no chest pain, no shortness of breath,  no palpitations, no leg swelling Respiratory: no cough, no shortness of breath Gastrointestinal: no nausea/vomiting/diarrhea Musculoskeletal: no muscle/joint aches Skin: + dry flaky skin to BLE, + hyperemia to BLE Neurological: no tremors, no numbness, no tingling, no dizziness Psychiatric: no depression, no anxiety  Objective:     BP (!) 146/96 (BP Location: Right Arm, Patient Position: Sitting, Cuff Size: Large) Comment: Retake manuel cuff patient has a kidney stone and is in pain  Pulse 75   Ht 5\' 5"  (1.651 m)   Wt (!) 302 lb 9.6 oz (137.3 kg)   LMP 08/27/2022 (Approximate)   BMI 50.36 kg/m   Wt Readings from Last 3 Encounters:  09/09/22 (!) 302 lb 9.6 oz (137.3 kg)  09/08/22 300 lb (136.1 kg)  08/24/22 300 lb (136.1 kg)     BP Readings from Last 3 Encounters:  09/09/22 (!) 146/96  09/08/22 (!) 182/100  08/24/22 (!) 180/87     Physical Exam- Limited  Constitutional:  Body mass index is 50.36 kg/m. , not in acute distress, normal state of mind Eyes:  EOMI, no exophthalmos Musculoskeletal: no gross deformities, strength intact in all four extremities, no gross restriction of joint movements Skin:  + dry flaky skin to BLE, + hyperemia to BLE Neurological: no tremor with outstretched hands   Diabetic Foot Exam - Simple   Simple Foot Form Diabetic Foot exam was performed with the following findings: Yes 09/09/2022  9:22 AM   Visual Inspection See comments: Yes Sensation Testing Intact to touch and monofilament testing bilaterally: Yes Pulse Check Posterior Tibialis and Dorsalis pulse intact bilaterally: Yes Comments Onychomycosis bilaterally; dry flaky skin bilaterally- sees dermatology     CMP ( most recent) CMP     Component Value Date/Time   NA 133 (L) 08/24/2022 0354   K 4.1 08/24/2022 0354   CL 99 08/24/2022 0354   CO2 21 (L) 08/24/2022 0354   GLUCOSE 244 (H) 08/24/2022 0354   BUN 17 08/24/2022 0354   CREATININE 1.05 (H) 08/24/2022 0354   CALCIUM 9.0 08/24/2022 0354   PROT 7.9 08/24/2022 0354   ALBUMIN 3.7 08/24/2022 0354   AST 20 08/24/2022 0354   ALT 27 08/24/2022 0354   ALKPHOS 89 08/24/2022 0354   BILITOT 0.6 08/24/2022 0354   GFRNONAA >60 08/24/2022 0354   GFRAA >60 04/06/2019 1242     Diabetic Labs (most recent): Lab Results  Component Value Date   HGBA1C 10.8 (H) 09/08/2022   HGBA1C 12.3 (H) 05/27/2022   HGBA1C 13.1 (H) 02/26/2022   MICROALBUR 7.9 (H) 02/26/2022     Lipid Panel ( most recent) Lipid Panel     Component Value Date/Time   CHOL 105 12/24/2021 1052   TRIG 119.0 12/24/2021 1052   HDL 38.60 (L) 12/24/2021 1052   CHOLHDL 3 12/24/2021 1052   VLDL 23.8 12/24/2021 1052   LDLCALC 42 12/24/2021 1052      Lab Results  Component Value Date   TSH 1.94 05/27/2022   TSH 2.11 02/26/2022   TSH 1.95 11/19/2021   FREET4 1.10 11/19/2021           Assessment & Plan:   1) Type 2 diabetes mellitus with hyperglycemia, without long-term current use of insulin  She presents today with her meter showing improved, yet still over target glycemic profile.  Her most recent A1c (done as part of preop testing) on 5/22 was 10.8%, improving from last A1c of 12.3%.  She has been dealing with problems regarding kidney stones, scheduled for surgery  in June.  Analysis of her meter shows 7-day average of 275, 14-day average of 269, 30-day average of 289, 90-day average of 302.   She has glucose ranging between 159-419.  - Harvel Ricks has currently uncontrolled symptomatic type 2 DM since 47 years of age.   -Recent labs reviewed.  - I had a long discussion with her about the progressive nature of diabetes and the pathology behind its complications. -her diabetes is complicated by pancreatitis (on 2 separate occasions) and she remains at a high risk for more acute and chronic complications which include CAD, CVA, CKD, retinopathy, and neuropathy. These are all discussed in detail with her.  The following Lifestyle Medicine recommendations according to American College of Lifestyle Medicine [facility]) were discussed and offered to patient and she agrees to start the journey:  A. Whole Foods, Plant-based plate comprising of fruits and vegetables, plant-based proteins, whole-grain carbohydrates was discussed in detail with the patient.   A list for source of those nutrients were also provided to the patient.  Patient will use only water or unsweetened tea for hydration. B.  The need to stay away from risky substances including alcohol, smoking; obtaining 7 to 9 hours of restorative sleep, at least 150 minutes of moderate intensity exercise weekly, the importance of healthy social connections,  and stress reduction techniques were discussed. C.  A full color page of  Calorie density of various food groups per pound showing examples of each food groups was provided to the patient.  - Nutritional counseling repeated at each appointment due to patients tendency to fall back in to old habits.  - The patient admits there is a room for improvement in their diet and drink choices. -  Suggestion is made for the patient to avoid simple carbohydrates from their diet including Cakes, Sweet Desserts / Pastries, Ice Cream, Soda (diet and regular), Sweet Tea, Candies, Chips, Cookies, Sweet Pastries, Store Bought Juices, Alcohol in Excess of 1-2 drinks a day, Artificial Sweeteners, Coffee  Creamer, and "Sugar-free" Products. This will help patient to have stable blood glucose profile and potentially avoid unintended weight gain.   - I encouraged the patient to switch to unprocessed or minimally processed complex starch and increased protein intake (animal or plant source), fruits, and vegetables.   - Patient is advised to stick to a routine mealtimes to eat 3 meals a day and avoid unnecessary snacks (to snack only to correct hypoglycemia).  - she will be scheduled with Norm Salt, RDN, CDE for diabetes education.  - I have approached her with the following individualized plan to manage her diabetes and patient agrees:   - she is advised to continue Metformin 1000 mg po twice daily with meals and Glipizide 5 mg XL po daily, therapeutically suitable for patient.  She can also continue Trulicity 1.5 mg SQ weekly (will not increase due to risk of pancreatitis).  I did discuss and initiate basal insulin today to help her get to goal quickly as to not delay her kidney stone procedure coming up.  I sent in for Basaglar 30 units SQ nightly.  We went over proper injection technique in the room today.  I did ask that she reach out via MyChart in 2 weeks with readings to adjustments can be made to dosage if needed.  --she is encouraged to continue monitoring glucose 2 times daily, before breakfast and before bed, to and to call the clinic if she has readings less than 70 or above 300 for  3 tests in a row.  - Adjustment parameters are given to her for hypo and hyperglycemia in writing.  - her Marcelline Deist was previously discontinued, risk outweighs benefit for this patient- has had yeast and UTI infections since starting this medication.  - she is not an ideal candidate for incretin therapy given her history of pancreatitis on 2 separate occasions in the past.  However, she has been on Trulicity 1.5 mg SQ weekly and tolerating it well, therefore she can continue it for now.    - Specific  targets for  A1c; LDL, HDL, and Triglycerides were discussed with the patient.  2) Blood Pressure /Hypertension:  her blood pressure is NOT controlled to target but she reports she is in a great deal of pain due to kidney stone.   she is advised to continue her current medications including Lisinopril 40 mg p.o. daily with breakfast.  3) Lipids/Hyperlipidemia:    Review of her recent lipid panel from 12/24/21 showed controlled LDL at 42 . She is not currently on any lipid lowering medications.  4)  Weight/Diet:  her Body mass index is 50.36 kg/m.  -  clearly complicating her diabetes care.   she is a candidate for weight loss. I discussed with her the fact that loss of 5 - 10% of her  current body weight will have the most impact on her diabetes management.  Exercise, and detailed carbohydrates information provided  -  detailed on discharge instructions.  5) Chronic Care/Health Maintenance: -she is on ACEI/ARB and not on Statin medications and is encouraged to initiate and continue to follow up with Ophthalmology, Dentist, Podiatrist at least yearly or according to recommendations, and advised to stay away from smoking. I have recommended yearly flu vaccine and pneumonia vaccine at least every 5 years; moderate intensity exercise for up to 150 minutes weekly; and sleep for at least 7 hours a day.  - she is advised to maintain close follow up with Avanell Shackleton, NP-C for primary care needs, as well as her other providers for optimal and coordinated care.     I spent  48  minutes in the care of the patient today including review of labs from CMP, Lipids, Thyroid Function, Hematology (current and previous including abstractions from other facilities); face-to-face time discussing  her blood glucose readings/logs, discussing hypoglycemia and hyperglycemia episodes and symptoms, medications doses, her options of short and long term treatment based on the latest standards of care / guidelines;   discussion about incorporating lifestyle medicine;  and documenting the encounter. Risk reduction counseling performed per USPSTF guidelines to reduce obesity and cardiovascular risk factors.     Please refer to Patient Instructions for Blood Glucose Monitoring and Insulin/Medications Dosing Guide"  in media tab for additional information. Please  also refer to " Patient Self Inventory" in the Media  tab for reviewed elements of pertinent patient history.  Harvel Ricks participated in the discussions, expressed understanding, and voiced agreement with the above plans.  All questions were answered to her satisfaction. she is encouraged to contact clinic should she have any questions or concerns prior to her return visit.     Follow up plan: - Return in about 3 months (around 12/10/2022) for Diabetes F/U with A1c in office, No previsit labs, Bring meter and logs.   Ronny Bacon, [facility] [address] [place], [place] [address] Phone: [phone] Fax: [phone]  09/09/2022, 9:24 AM

## 2022-09-14 NOTE — Progress Notes (Signed)
Anesthesia Chart Review   Case: [id number] Date/Time: 09/21/22 1215   Procedure: CYSTOSCOPY RIGHT URETEROSCOPY/HOLMIUM LASER/STENT PLACEMENT (Right) - 75 MINS FOR CASE   Anesthesia type: General   Pre-op diagnosis: RIGHT URETERAL PELVIC JUNCTION/ PROXIMAL URETERAL STONE   Location: [facility] PROCEDURE [facility] / [facility]   Surgeons: [name], MD       DISCUSSION:47 y.o. never smoker with h/o HTN, DM, celiac disease, right ureteral pelvic junction/proximal ureteral stone scheduled for above procedure 09/21/2022 with Dr. [name].   Poorly controlled DM.  Seen by endo 09/09/2022.  DM control somewhat improved at this visit with A1C 10.8 from 12.3%. Glucose readings between 159-419. Basal insulin initiated at this visit. Evaluate glucose DOS.   VS: BP (!) 182/100   Pulse 83   Temp 36.9 C (Oral)   Resp 16   Ht 5\' 5"  (1.651 m)   Wt 136.1 kg   LMP 08/27/2022 (Approximate)   SpO2 98%   BMI 49.92 kg/m   PROVIDERS: Avanell Shackleton, NP-C is PCP    LABS: Labs reviewed: Acceptable for surgery. (all labs ordered are listed, but only abnormal results are displayed)  Labs Reviewed  HEMOGLOBIN A1C - Abnormal; Notable for the following components:      Result Value   Hgb A1c MFr Bld 10.8 (*)    All other components within normal limits  GLUCOSE, CAPILLARY - Abnormal; Notable for the following components:   Glucose-Capillary 268 (*)    All other components within normal limits     IMAGES:   EKG:   CV:  Past Medical History:  Diagnosis Date   Anemia    Anxiety    Arthritis    B12 deficiency    Back pain    Celiac disease    Chronic fatigue syndrome    Constipation    Depression    DM (diabetes mellitus) (HCC)    Gallbladder problem    GERD (gastroesophageal reflux disease)    Heartburn    History of kidney stones    Hypertension    Joint pain    Pancreatitis    Psoriasis    SOBOE (shortness of breath on exertion)    Vitamin D deficiency     Past Surgical  History:  Procedure Laterality Date   CHOLECYSTECTOMY     WISDOM TOOTH EXTRACTION      MEDICATIONS:  busPIRone (BUSPAR) 5 MG tablet   Dulaglutide (TRULICITY) 1.5 MG/0.5ML SOPN   escitalopram (LEXAPRO) 20 MG tablet   glipiZIDE (GLUCOTROL XL) 5 MG 24 hr tablet   hydrocortisone-pramoxine (PROCTOFOAM-HC) rectal foam   Insulin Glargine (BASAGLAR KWIKPEN) 100 UNIT/ML   Insulin Pen Needle (PEN NEEDLES) 31G X 8 MM MISC   lisinopril (ZESTRIL) 40 MG tablet   metFORMIN (GLUCOPHAGE) 1000 MG tablet   ondansetron (ZOFRAN-ODT) 8 MG disintegrating tablet   oxyCODONE-acetaminophen (PERCOCET) 10-325 MG tablet   pregabalin (LYRICA) 75 MG capsule   Risankizumab-rzaa (SKYRIZI) 150 MG/ML SOSY   tamsulosin (FLOMAX) 0.4 MG CAPS capsule   Tapinarof (VTAMA) 1 % CREA   triamcinolone cream (KENALOG) 0.1 %   Urea 45 % CREA   Vitamin D, Ergocalciferol, (DRISDOL) 1.25 MG (50000 UNIT) CAPS capsule   No current facility-administered medications for this encounter.     Jodell Cipro Ward, PA-C [facility]-Surgical Testing [phone]

## 2022-09-16 ENCOUNTER — Other Ambulatory Visit (HOSPITAL_BASED_OUTPATIENT_CLINIC_OR_DEPARTMENT_OTHER): Payer: Self-pay

## 2022-09-16 ENCOUNTER — Ambulatory Visit (INDEPENDENT_AMBULATORY_CARE_PROVIDER_SITE_OTHER): Payer: Commercial Managed Care - HMO | Admitting: Physician Assistant

## 2022-09-16 ENCOUNTER — Encounter (INDEPENDENT_AMBULATORY_CARE_PROVIDER_SITE_OTHER): Payer: Self-pay | Admitting: Physician Assistant

## 2022-09-16 VITALS — BP 169/90 | HR 75 | Temp 98.2°F | Ht 65.0 in | Wt 298.0 lb

## 2022-09-16 DIAGNOSIS — Z6841 Body Mass Index (BMI) 40.0 and over, adult: Secondary | ICD-10-CM | POA: Diagnosis not present

## 2022-09-16 DIAGNOSIS — E1169 Type 2 diabetes mellitus with other specified complication: Secondary | ICD-10-CM

## 2022-09-16 DIAGNOSIS — Z794 Long term (current) use of insulin: Secondary | ICD-10-CM

## 2022-09-16 DIAGNOSIS — Z7985 Long-term (current) use of injectable non-insulin antidiabetic drugs: Secondary | ICD-10-CM

## 2022-09-16 DIAGNOSIS — Z7984 Long term (current) use of oral hypoglycemic drugs: Secondary | ICD-10-CM

## 2022-09-16 DIAGNOSIS — E559 Vitamin D deficiency, unspecified: Secondary | ICD-10-CM

## 2022-09-16 MED ORDER — VITAMIN D (ERGOCALCIFEROL) 1.25 MG (50000 UNIT) PO CAPS
ORAL_CAPSULE | ORAL | 0 refills | Status: DC
Start: 2022-09-16 — End: 2022-09-30
  Filled 2022-09-16: qty 8, 28d supply, fill #0

## 2022-09-16 MED ORDER — TRULICITY 1.5 MG/0.5ML ~~LOC~~ SOAJ
1.5000 mg | SUBCUTANEOUS | 0 refills | Status: DC
Start: 2022-09-16 — End: 2022-09-30
  Filled 2022-09-16: qty 2, 28d supply, fill #0

## 2022-09-16 NOTE — Progress Notes (Signed)
.smr  Office: [phone]  /  Fax: [phone]  WEIGHT SUMMARY AND BIOMETRICS  Vitals Temp: 98.2 F (36.8 C) BP: (!) 169/90 Pulse Rate: 75 SpO2: 100 %   Anthropometric Measurements Height: 5\' 5"  (1.651 m) Weight: 298 lb (135.2 kg) BMI (Calculated): 49.59 Weight at Last Visit: 301 lb Weight Lost Since Last Visit: 3 lb Weight Gained Since Last Visit: 0 Starting Weight: 299 lb Total Weight Loss (lbs): 1 lb (0.454 kg)   Body Composition  Body Fat %: 47.5 % Fat Mass (lbs): 141.6 lbs Muscle Mass (lbs): 148.6 lbs Total Body Water (lbs): 98.2 lbs Visceral Fat Rating : 17   Other Clinical Data Fasting: Yes Labs: No Today's Visit #: 8 Starting Date: 05/19/22     HPI  Chief Complaint: OBESITY  April Gonzales is here to discuss her progress with her obesity treatment plan. She is on the the Category 4 Plan and states she is following her eating plan approximately 80 % of the time. She states she is exercising 0 minutes 0 times per week.  Discussed the use of AI scribe software for clinical note transcription with the patient, who gave verbal consent to proceed.   Interval History:  Since last office visit she down 3 lbs.    She reports having difficulty with kidney stones and is scheduled for surgery on 09/19/22 to address this issue.    The patient's appetite has been variable, but she reports trying to eat more vegetables and has been enjoying Yasso ice cream as a low-calorie, high-protein, low-sugar treat. She has been making an effort to stay hydrated, drinking three to four bottles of water a day, along with some Simply lemonade and occasionally Pepsi. We discussed the importance of avoiding sugar sweetened beverages.   Her sleep has been inconsistent, sometimes requiring Percocet for pain management to allow her to rest. She describes her stress level as high, but she has a support system in place with a friend helping her with appointments.  Obesity: Patient has lost 3  pounds. Reports efforts to increase vegetable intake and reduce sugar intake. -Continue current dietary modifications. -Encourage increased water intake and reduction of regular soda consumption.  Pharmacotherapy: Trulicity 1.5 mg weekly. Hx of pancreatitis in past x 2, but has tolerated Trulicity well.  Denies mass in neck, dysphagia, dyspepsia, persistent hoarseness, abdominal pain, or N/V/Constipation or diarrhea. Has annual eye exam. Mood is stable- sees Psychiatry monthly.   TREATMENT PLAN FOR OBESITY:  Recommended Dietary Goals  April Gonzales is currently in the action stage of change. As such, her goal is to continue weight management plan. She has agreed to the Category 4 Plan.  Behavioral Intervention  We discussed the following Behavioral Modification Strategies today: increasing lean protein intake, decreasing simple carbohydrates , increasing vegetables, increasing lower glycemic fruits, increasing fiber rich foods, avoiding skipping meals, increasing water intake, continue to practice mindfulness when eating, and planning for success.  Additional resources provided today: NA  Recommended Physical Activity Goals  April Gonzales has been advised to work up to 150 minutes of moderate intensity aerobic activity a week and strengthening exercises 2-3 times per week for cardiovascular health, weight loss maintenance and preservation of muscle mass. April Gonzales is not able to exercise much with her current physical conditions.   She has agreed to Continue current level of physical activity  and consider for chair yoga therapy once she has recovered following treatment for kidney stones   Pharmacotherapy We discussed various medication options to help April Gonzales with her weight  loss efforts and we both agreed to continue Trulicity and metformin and insulin as directed by Endocrinology for Type 2 diabetes management.   Return in about 2 weeks (around 09/30/2022).[name] She was informed of the importance of frequent  follow up visits to maximize her success with intensive lifestyle modifications for her multiple health conditions.  PHYSICAL EXAM:  Blood pressure (!) 169/90, pulse 75, temperature 98.2 F (36.8 C), height 5\' 5"  (1.651 m), weight 298 lb (135.2 kg), last menstrual period 08/27/2022, SpO2 100 %. Body mass index is 49.59 kg/m.  General: She is overweight, cooperative, alert, well developed, and in mild distress due to kidney stones today. Walking with rolling walker.  PSYCH: Has normal mood, affect and thought process.   Cardiovascular: HR 70's regular. BP elevated today, but reports in pain from kidney stones.  Lungs: Normal breathing effort, no conversational dyspnea.  DIAGNOSTIC DATA REVIEWED:  BMET    Component Value Date/Time   NA 133 (L) 08/24/2022 0354   K 4.1 08/24/2022 0354   CL 99 08/24/2022 0354   CO2 21 (L) 08/24/2022 0354   GLUCOSE 244 (H) 08/24/2022 0354   BUN 17 08/24/2022 0354   CREATININE 1.05 (H) 08/24/2022 0354   CALCIUM 9.0 08/24/2022 0354   GFRNONAA >60 08/24/2022 0354   GFRAA >60 04/06/2019 1242   Lab Results  Component Value Date   HGBA1C 10.8 (H) 09/08/2022   HGBA1C 6.6 (H) 02/21/2016   Lab Results  Component Value Date   INSULIN 79.5 (H) 05/19/2022   Lab Results  Component Value Date   TSH 1.94 05/27/2022   CBC    Component Value Date/Time   WBC 15.8 (H) 08/24/2022 0408   RBC 5.19 (H) 08/24/2022 0408   HGB 13.4 08/24/2022 0408   HCT 40.9 08/24/2022 0408   PLT 322 08/24/2022 0408   MCV 78.8 (L) 08/24/2022 0408   MCH 25.8 (L) 08/24/2022 0408   MCHC 32.8 08/24/2022 0408   RDW 14.4 08/24/2022 0408   Iron Studies No results found for: "IRON", "TIBC", "FERRITIN", "IRONPCTSAT" Lipid Panel     Component Value Date/Time   CHOL 105 12/24/2021 1052   TRIG 119.0 12/24/2021 1052   HDL 38.60 (L) 12/24/2021 1052   CHOLHDL 3 12/24/2021 1052   VLDL 23.8 12/24/2021 1052   LDLCALC 42 12/24/2021 1052   Hepatic Function Panel     Component  Value Date/Time   PROT 7.9 08/24/2022 0354   ALBUMIN 3.7 08/24/2022 0354   AST 20 08/24/2022 0354   ALT 27 08/24/2022 0354   ALKPHOS 89 08/24/2022 0354   BILITOT 0.6 08/24/2022 0354      Component Value Date/Time   TSH 1.94 05/27/2022 1524   Nutritional Lab Results  Component Value Date   VD25OH 4.7 (L) 05/19/2022    ASSOCIATED CONDITIONS ADDRESSED TODAY  ASSESSMENT AND PLAN  Problem List Items Addressed This Visit     Morbid obesity (HCC)   Relevant Medications   Dulaglutide (TRULICITY) 1.5 MG/0.5ML SOPN   Type 2 diabetes mellitus with other specified complication (HCC) - Primary   Relevant Medications   Dulaglutide (TRULICITY) 1.5 MG/0.5ML SOPN   Vitamin D deficiency   Relevant Medications   Vitamin D, Ergocalciferol, (DRISDOL) 1.25 MG (50000 UNIT) CAPS capsule   BMI 45.0-49.9, adult (HCC)   Relevant Medications   Dulaglutide (TRULICITY) 1.5 MG/0.5ML SOPN  Kidney Stones: First occurrence, causing significant discomfort. Scheduled for outpatient surgery on 09/21/2022.Will follow with Urology/PCP.  - Type 2 Diabetes: Recent addition of insulin glargine  30 units at bedtime by endocrinology due to elevated fasting blood sugars and A1c. A1c has improved from 12.3 to 10.8. Fasting blood sugars are decreasing but still elevated (187, 159). Currently on metformin 1000mg  twice daily and Trulicity 1.5mg  weekly. -Continue current regimen under the direction of endocrinology. -Hold Trulicity dose on 09/20/2022 per surgical guidelines. -Refill Trulicity prescription.  Vitamin D Deficiency Vitamin D is not at goal of 50.  Most recent vitamin D level was 4.7. She is on  prescription ergocalciferol 50,000 IU twice weekly. Lab Results  Component Value Date   VD25OH 4.7 (L) 05/19/2022    Plan: Continue and refill  prescription ergocalciferol 50,000 IU twice weekly Low vitamin D levels can be associated with adiposity and may result in leptin resistance and weight gain. Also  associated with fatigue. Currently on vitamin D supplementation without any adverse effects.  Will plan to recheck vitamin D level next visit.   ATTESTASTION STATEMENTS:  Reviewed by clinician on day of visit: allergies, medications, problem list, medical history, surgical history, family history, social history, and previous encounter notes.   I have personally spent 30 minutes total time today in preparation, patient care, nutritional counseling and documentation for this visit, including the following: review of clinical lab tests; review of medical tests/procedures/services.      Krishna Dancel, PA-C

## 2022-09-19 NOTE — H&P (Signed)
Office Visit Report     08/27/2022   --------------------------------------------------------------------------------   April Gonzales  MRN: [id number]  DOB: 1975/04/23, 47 year old Female  SSN:    PRIMARY CARE:  Hetty Blend, NP  PRIMARY CARE FAX:  [phone]  REFERRING:    PROVIDER:  [name], M.D.  LOCATION:  [facility] [phone]     --------------------------------------------------------------------------------   CC/HPI: New patient-   1) right UPJ stone-patient developed right flank pain and underwent a CT scan abdomen and pelvis May 2024. This revealed a 7 to 8 mm right UPJ stone (may be visible on the scout, 23 cm SSD). There was also a 5 mm right midpole stone. UA was negative. Creatinine 1.05. White count was 15 but it looks like her baseline is around 12.   She has more epigastric pain and thought she had "pancreatitis". She hasn't seen a stone pass. She started tamsulosin. She is drinking water and lemondade. Pain is mild - dull ache. No fever.   KUB today with an 8 mm right prox stone and right renal stone.   No prior h/o stones.   She is on LT disability. She worked for [employer] call center. Pinched nerve back. Walks with walker. She has suspected sleep apnea. Test is pending.     ALLERGIES: Dilaudid Hydromorphone Methotrexate    MEDICATIONS: Metformin Hcl 1,000 mg tablet  Tamsulosin Hcl 0.4 mg capsule  Buspirone Hcl  Lexapro 20 mg tablet  Lyrica 75 mg capsule  Nystatin  Oxycodone-Acetaminophen 10 mg-325 mg tablet  Skyrizi 150 mg/ml syringe  Triamcinolone  Zofran 8Mg      GU PSH: None     PSH Notes: Wisdom Teeth Removal  Disectomy (2011)    NON-GU PSH: Cholecystectomy (laparoscopic)     GU PMH: None   NON-GU PMH: Anxiety Arrhythmia Arthritis GERD Hypertension Sleep Apnea    FAMILY HISTORY: Kidney Stones - Runs in Family   SOCIAL HISTORY: Marital Status: Single Preferred Language: English; Ethnicity: Not  Hispanic Or Latino; Race: White Current Smoking Status: Patient has never smoked.   Tobacco Use Assessment Completed: Used Tobacco in last 30 days? Has never drank.  Drinks 2 caffeinated drinks per day.    REVIEW OF SYSTEMS:    GU Review Female:   Patient reports stream starts and stops, hard to postpone urination, frequent urination, and get up at night to urinate. Patient denies trouble starting your stream, being pregnant, have to strain to urinate, burning /pain with urination, and leakage of urine.  Gastrointestinal (Upper):   Patient reports nausea, vomiting, and indigestion/ heartburn.   Gastrointestinal (Lower):   Patient reports constipation. Patient denies diarrhea.  Constitutional:   Patient reports fever, night sweats, and fatigue. Patient denies weight loss.  Skin:   Patient denies skin rash/ lesion and itching.  Eyes:   Patient denies blurred vision and double vision.  Ears/ Nose/ Throat:   Patient denies sore throat and sinus problems.  Hematologic/Lymphatic:   Patient denies swollen glands and easy bruising.  Cardiovascular:   Patient denies leg swelling and chest pains.  Respiratory:   Patient denies cough and shortness of breath.  Endocrine:   Patient reports excessive thirst.   Musculoskeletal:   Patient reports back pain. Patient denies joint pain.  Neurological:   Patient reports headaches. Patient denies dizziness.  Psychologic:   Patient reports depression and anxiety.    VITAL SIGNS:      08/27/2022 08:56 AM  Weight 300 lb / 136.08 kg  Height 65 in / 165.1 cm  BP 154/84 mmHg  Pulse 83 /min  Temperature 98.1 F / 36.7 C  BMI 49.9 kg/m   MULTI-SYSTEM PHYSICAL EXAMINATION:    Constitutional: Well-nourished. No physical deformities. Normally developed. Good grooming. Sittiing comfortably.   Neck: Neck symmetrical, not swollen. Normal tracheal position.  Respiratory: No labored breathing, no use of accessory muscles.   Cardiovascular: Normal temperature, normal  extremity pulses, no swelling, no varicosities.  Skin: No paleness, no jaundice, no cyanosis. No lesion, no ulcer, no rash.  Neurologic / Psychiatric: Oriented to time, oriented to place, oriented to person. No depression, no anxiety, no agitation.  Gastrointestinal: No mass, no tenderness, no rigidity, non obese abdomen.  Eyes: Normal conjunctivae. Normal eyelids.  Ears, Nose, Mouth, and Throat: Left ear no scars, no lesions, no masses. Right ear no scars, no lesions, no masses. Nose no scars, no lesions, no masses. Normal hearing. Normal lips.  Musculoskeletal: Normal gait and station of head and neck.     Complexity of Data:  Lab Test Review:   BUN/Creatinine, CBC with Diff  Records Review:   Previous Hospital Records  X-Ray Review: C.T. Abdomen/Pelvis: Reviewed Films. Discussed With Patient. 2024    PROCEDURES:         KUB - [id number]  A single view of the abdomen is obtained.  Calculi:  8 mm right prox stone and right renal stone      The bones appeared normal. The bowel gas pattern appeared normal. The soft tissues were unremarkable. . Patient confirmed No Neulasta OnPro Device.           Urinalysis w/Scope Dipstick Dipstick Cont'd Micro  Color: Yellow Bilirubin: Neg mg/dL WBC/hpf: NS (Not Seen)  Appearance: Clear Ketones: Neg mg/dL RBC/hpf: 3 - 16/XWR  Specific Gravity: 1.020 Blood: 3+ ery/uL Bacteria: Rare (0-9/hpf)  pH: <=5.0 Protein: Neg mg/dL Cystals: NS (Not Seen)  Glucose: Neg mg/dL Urobilinogen: 0.2 mg/dL Casts: NS (Not Seen)    Nitrites: Neg Trichomonas: Not Present    Leukocyte Esterase: Neg leu/uL Mucous: Not Present      Epithelial Cells: 0 - 5/hpf      Yeast: NS (Not Seen)      Sperm: Not Present    Notes: Micro performed on unspun urine    ASSESSMENT:      ICD-10 Details  1 GU:   Renal calculus - N20.0 Undiagnosed New Problem - disc the right stone.  2   Ureteral calculus - N20.1 Undiagnosed New Problem - I discussed with the patient the nature risks and  benefits of continued stone passage, off label use of alpha blockers, shockwave lithotripsy or ureteroscopy. All questions answered.   KUB today 8 mm right prox stone and right renal stone.   She really wants to avoid URS. Will set up for right ESWL. May need anesthesia.   Discussed possible need for staged procedure and lower success with inc SSD. Also a colleague may be doing the procedure.    PLAN:           Orders Labs Urine Culture  X-Rays: KUB          Schedule Return Visit/Planned Activity: Next Available Appointment - Schedule Surgery          Document Letter(s):  Created for Patient: Clinical Summary         Notes:   cc: NP Suezanne Jacquet         Next Appointment:      Next Appointment: 09/03/2022 11:15  AM    Appointment Type: Surgery     Location: [facility] [phone]    Provider: Berniece Salines, M.D.    Reason for Visit: NE/OP RIGHT ESWL      * Signed by [name], M.D. on 08/29/22 at 4:09 PM (EDT*  Add: urine cx > 100K strep - I sent cephalexin to start on Thurs 09/16/2022.

## 2022-09-21 ENCOUNTER — Ambulatory Visit (HOSPITAL_COMMUNITY)
Admission: RE | Admit: 2022-09-21 | Discharge: 2022-09-21 | Disposition: A | Discharge status: Home / Self Care | Payer: Commercial Managed Care - HMO | Source: Ambulatory Visit | Attending: Urology | Admitting: Urology

## 2022-09-21 ENCOUNTER — Ambulatory Visit (HOSPITAL_BASED_OUTPATIENT_CLINIC_OR_DEPARTMENT_OTHER): Payer: Commercial Managed Care - HMO | Admitting: Anesthesiology

## 2022-09-21 ENCOUNTER — Encounter (HOSPITAL_COMMUNITY)
Admission: RE | Disposition: A | Discharge status: Home / Self Care | Payer: Self-pay | Source: Ambulatory Visit | Attending: Urology

## 2022-09-21 ENCOUNTER — Ambulatory Visit (HOSPITAL_COMMUNITY): Payer: Commercial Managed Care - HMO | Admitting: Physician Assistant

## 2022-09-21 ENCOUNTER — Encounter (HOSPITAL_COMMUNITY): Payer: Self-pay | Admitting: Urology

## 2022-09-21 ENCOUNTER — Ambulatory Visit (HOSPITAL_COMMUNITY): Payer: Commercial Managed Care - HMO

## 2022-09-21 ENCOUNTER — Other Ambulatory Visit: Payer: Self-pay

## 2022-09-21 DIAGNOSIS — I10 Essential (primary) hypertension: Secondary | ICD-10-CM | POA: Diagnosis not present

## 2022-09-21 DIAGNOSIS — F419 Anxiety disorder, unspecified: Secondary | ICD-10-CM | POA: Diagnosis not present

## 2022-09-21 DIAGNOSIS — N201 Calculus of ureter: Secondary | ICD-10-CM | POA: Diagnosis present

## 2022-09-21 DIAGNOSIS — Z841 Family history of disorders of kidney and ureter: Secondary | ICD-10-CM | POA: Diagnosis not present

## 2022-09-21 DIAGNOSIS — K219 Gastro-esophageal reflux disease without esophagitis: Secondary | ICD-10-CM | POA: Insufficient documentation

## 2022-09-21 DIAGNOSIS — Z6841 Body Mass Index (BMI) 40.0 and over, adult: Secondary | ICD-10-CM | POA: Diagnosis not present

## 2022-09-21 DIAGNOSIS — Z7984 Long term (current) use of oral hypoglycemic drugs: Secondary | ICD-10-CM

## 2022-09-21 DIAGNOSIS — Z794 Long term (current) use of insulin: Secondary | ICD-10-CM

## 2022-09-21 DIAGNOSIS — F32A Depression, unspecified: Secondary | ICD-10-CM | POA: Insufficient documentation

## 2022-09-21 DIAGNOSIS — Z79899 Other long term (current) drug therapy: Secondary | ICD-10-CM | POA: Insufficient documentation

## 2022-09-21 DIAGNOSIS — E119 Type 2 diabetes mellitus without complications: Secondary | ICD-10-CM

## 2022-09-21 DIAGNOSIS — M1990 Unspecified osteoarthritis, unspecified site: Secondary | ICD-10-CM | POA: Diagnosis not present

## 2022-09-21 DIAGNOSIS — N401 Enlarged prostate with lower urinary tract symptoms: Secondary | ICD-10-CM

## 2022-09-21 HISTORY — PX: CYSTOSCOPY/URETEROSCOPY/HOLMIUM LASER/STENT PLACEMENT: SHX6546

## 2022-09-21 LAB — BASIC METABOLIC PANEL
Anion gap: 11 (ref 5–15)
BUN: 13 mg/dL (ref 6–20)
CO2: 24 mmol/L (ref 22–32)
Calcium: 8.8 mg/dL — ABNORMAL LOW (ref 8.9–10.3)
Chloride: 102 mmol/L (ref 98–111)
Creatinine, Ser: 0.88 mg/dL (ref 0.44–1.00)
GFR, Estimated: >60 mL/min (ref >60)
Glucose, Bld: 169 mg/dL — ABNORMAL HIGH (ref 70–99)
Potassium: 3.9 mmol/L (ref 3.5–5.1)
Sodium: 137 mmol/L (ref 135–145)

## 2022-09-21 LAB — POCT PREGNANCY, URINE: Preg Test, Ur: NEGATIVE

## 2022-09-21 LAB — GLUCOSE, CAPILLARY
Glucose-Capillary: 157 mg/dL — ABNORMAL HIGH (ref 70–99)
Glucose-Capillary: 130 mg/dL — ABNORMAL HIGH (ref 70–99)

## 2022-09-21 SURGERY — CYSTOSCOPY/URETEROSCOPY/HOLMIUM LASER/STENT PLACEMENT
Anesthesia: General | Laterality: Right

## 2022-09-21 MED ORDER — PROPOFOL 10 MG/ML IV BOLUS
INTRAVENOUS | Status: DC | PRN
  Administered 2022-09-21: 200 mg via INTRAVENOUS

## 2022-09-21 MED ORDER — DEXAMETHASONE SODIUM PHOSPHATE 10 MG/ML IJ SOLN
INTRAMUSCULAR | Status: AC
  Filled 2022-09-21: qty 1

## 2022-09-21 MED ORDER — DEXAMETHASONE SODIUM PHOSPHATE 4 MG/ML IJ SOLN
INTRAMUSCULAR | Status: DC | PRN
  Administered 2022-09-21: 8 mg via INTRAVENOUS

## 2022-09-21 MED ORDER — FENTANYL CITRATE (PF) 100 MCG/2ML IJ SOLN
INTRAMUSCULAR | Status: DC | PRN
  Administered 2022-09-21 (×2): 50 ug via INTRAVENOUS
  Administered 2022-09-21: 100 ug via INTRAVENOUS

## 2022-09-21 MED ORDER — SODIUM CHLORIDE 0.9 % IV SOLN
2.0000 g | INTRAVENOUS | Status: AC
  Administered 2022-09-21: 2 g via INTRAVENOUS
  Filled 2022-09-21: qty 20

## 2022-09-21 MED ORDER — CEPHALEXIN 500 MG PO CAPS: 500.0000 mg | ORAL_CAPSULE | Freq: Every day | ORAL | 0 refills | Status: DC

## 2022-09-21 MED ORDER — FENTANYL CITRATE (PF) 100 MCG/2ML IJ SOLN
INTRAMUSCULAR | Status: AC
  Filled 2022-09-21: qty 2

## 2022-09-21 MED ORDER — ONDANSETRON HCL 4 MG/2ML IJ SOLN
INTRAMUSCULAR | Status: DC | PRN
  Administered 2022-09-21: 4 mg via INTRAVENOUS

## 2022-09-21 MED ORDER — LACTATED RINGERS IV SOLN: INTRAVENOUS | Status: DC | PRN

## 2022-09-21 MED ORDER — SODIUM CHLORIDE 0.9 % IR SOLN
Status: DC | PRN
  Administered 2022-09-21: 3000 mL via INTRAVESICAL

## 2022-09-21 MED ORDER — LIDOCAINE HCL (PF) 2 % IJ SOLN
INTRAMUSCULAR | Status: AC
  Filled 2022-09-21: qty 5

## 2022-09-21 MED ORDER — 0.9 % SODIUM CHLORIDE (POUR BTL) OPTIME
TOPICAL | Status: DC | PRN
  Administered 2022-09-21: 1000 mL

## 2022-09-21 MED ORDER — CHLORHEXIDINE GLUCONATE 0.12 % MT SOLN
15.0000 mL | Freq: Once | OROMUCOSAL | Status: AC
  Administered 2022-09-21: 15 mL via OROMUCOSAL

## 2022-09-21 MED ORDER — LACTATED RINGERS IV SOLN: INTRAVENOUS | Status: DC

## 2022-09-21 MED ORDER — ONDANSETRON HCL 4 MG/2ML IJ SOLN
INTRAMUSCULAR | Status: AC
  Filled 2022-09-21: qty 2

## 2022-09-21 MED ORDER — DEXMEDETOMIDINE HCL IN NACL 80 MCG/20ML IV SOLN
INTRAVENOUS | Status: DC | PRN
  Administered 2022-09-21: 8 ug via INTRAVENOUS

## 2022-09-21 MED ORDER — ORAL CARE MOUTH RINSE: 15.0000 mL | Freq: Once | OROMUCOSAL | Status: AC

## 2022-09-21 MED ORDER — FENTANYL CITRATE PF 50 MCG/ML IJ SOSY
25.0000 ug | PREFILLED_SYRINGE | INTRAMUSCULAR | Status: DC | PRN
  Administered 2022-09-21: 50 ug via INTRAVENOUS

## 2022-09-21 MED ORDER — FENTANYL CITRATE PF 50 MCG/ML IJ SOSY
PREFILLED_SYRINGE | INTRAMUSCULAR | Status: AC
  Filled 2022-09-21: qty 1

## 2022-09-21 MED ORDER — IOHEXOL 300 MG/ML  SOLN
INTRAMUSCULAR | Status: DC | PRN
  Administered 2022-09-21: 8 mL

## 2022-09-21 MED ORDER — MIDAZOLAM HCL 5 MG/5ML IJ SOLN
INTRAMUSCULAR | Status: DC | PRN
  Administered 2022-09-21: 2 mg via INTRAVENOUS

## 2022-09-21 MED ORDER — PROPOFOL 10 MG/ML IV BOLUS
INTRAVENOUS | Status: AC
  Filled 2022-09-21: qty 20

## 2022-09-21 MED ORDER — MIDAZOLAM HCL 2 MG/2ML IJ SOLN
INTRAMUSCULAR | Status: AC
  Filled 2022-09-21: qty 2

## 2022-09-21 MED ORDER — ACETAMINOPHEN 500 MG PO TABS
1000.0000 mg | ORAL_TABLET | Freq: Once | ORAL | Status: AC
  Administered 2022-09-21: 1000 mg via ORAL
  Filled 2022-09-21: qty 2

## 2022-09-21 SURGICAL SUPPLY — 22 items
BAG URO CATCHER STRL LF (MISCELLANEOUS) ×1 IMPLANT
BASKET ZERO TIP NITINOL 2.4FR (BASKET) IMPLANT
BSKT STON RTRVL ZERO TP 2.4FR (BASKET)
CATH URET 5FR 28IN CONE TIP (BALLOONS)
CATH URET 5FR 70CM CONE TIP (BALLOONS) IMPLANT
CATH URETL OPEN END 6FR 70 (CATHETERS) ×1 IMPLANT
CLOTH BEACON ORANGE TIMEOUT ST (SAFETY) ×1 IMPLANT
FIBER LASER MOSES 200 DFL (Laser) IMPLANT
FIBER LASER MOSES 365 DFL (Laser) IMPLANT
GLOVE BIO SURGEON STRL SZ7.5 (GLOVE) ×1 IMPLANT
GOWN STRL REUS W/ TWL XL LVL3 (GOWN DISPOSABLE) ×1 IMPLANT
GOWN STRL REUS W/TWL XL LVL3 (GOWN DISPOSABLE) ×1
GUIDEWIRE STR DUAL SENSOR (WIRE) ×1 IMPLANT
GUIDEWIRE ZIPWRE .038 STRAIGHT (WIRE) IMPLANT
KIT TURNOVER KIT A (KITS) IMPLANT
MANIFOLD NEPTUNE II (INSTRUMENTS) ×1 IMPLANT
PACK CYSTO (CUSTOM PROCEDURE TRAY) ×1 IMPLANT
SHEATH NAVIGATOR HD 11/13X28 (SHEATH) IMPLANT
SHEATH NAVIGATOR HD 11/13X36 (SHEATH) IMPLANT
STENT URET 6FRX24 CONTOUR (STENTS) IMPLANT
TUBING CONNECTING 10 (TUBING) ×1 IMPLANT
TUBING UROLOGY SET (TUBING) ×1 IMPLANT

## 2022-09-21 NOTE — Transfer of Care (Signed)
Immediate Anesthesia Transfer of Care Note  Patient: April Gonzales  Procedure(s) Performed: CYSTOSCOPY RIGHT URETEROSCOPY/HOLMIUM LASER/STENT PLACEMENT (Right)  Patient Location: PACU  Anesthesia Type:General  Level of Consciousness: awake and alert   Airway & Oxygen Therapy: Patient Spontanous Breathing and Patient connected to nasal cannula oxygen  Post-op Assessment: Report given to RN and Post -op Vital signs reviewed and stable  Post vital signs: Reviewed and stable  Last Vitals:  Vitals Value Taken Time  BP 169/99 09/21/22 1749  Temp 36.8 C 09/21/22 1749  Pulse 83 09/21/22 1752  Resp 17 09/21/22 1752  SpO2 96 % 09/21/22 1752  Vitals shown include unvalidated device data.  Last Pain:  Vitals:   09/21/22 1421  TempSrc: Oral  PainSc:          Complications: No notable events documented.

## 2022-09-21 NOTE — Anesthesia Postprocedure Evaluation (Signed)
Anesthesia Post Note  Patient: April Gonzales  Procedure(s) Performed: CYSTOSCOPY RIGHT URETEROSCOPY/HOLMIUM LASER/STENT PLACEMENT (Right)     Patient location during evaluation: PACU Anesthesia Type: General Level of consciousness: awake and alert Pain management: pain level controlled Vital Signs Assessment: post-procedure vital signs reviewed and stable Respiratory status: spontaneous breathing, nonlabored ventilation and respiratory function stable Cardiovascular status: blood pressure returned to baseline and stable Postop Assessment: no apparent nausea or vomiting Anesthetic complications: no  No notable events documented.  Last Vitals:  Vitals:   09/21/22 1830 09/21/22 1844  BP: (!) 177/91   Pulse: 73 79  Resp: 18 18  Temp: 36.6 C 36.6 C  SpO2: 95% 98%    Last Pain:  Vitals:   09/21/22 1815  TempSrc:   PainSc: 8                  Rajanae Mantia,W. EDMOND

## 2022-09-21 NOTE — Anesthesia Procedure Notes (Signed)
Procedure Name: LMA Insertion Date/Time: 09/21/2022 4:54 PM  Performed by: Deri Fuelling, CRNAPre-anesthesia Checklist: Patient identified, Emergency Drugs available, Suction available and Patient being monitored Patient Re-evaluated:Patient Re-evaluated prior to induction Oxygen Delivery Method: Circle system utilized Preoxygenation: Pre-oxygenation with 100% oxygen Induction Type: IV induction Ventilation: Mask ventilation without difficulty LMA Size: 4.0 Tube type: Oral Number of attempts: 1 Airway Equipment and Method: Stylet and Oral airway Placement Confirmation: ETT inserted through vocal cords under direct vision, positive ETCO2 and breath sounds checked- equal and bilateral Tube secured with: Tape Dental Injury: Teeth and Oropharynx as per pre-operative assessment

## 2022-09-21 NOTE — Anesthesia Preprocedure Evaluation (Addendum)
Anesthesia Evaluation  Patient identified by MRN, date of birth, ID band Patient awake    Reviewed: Allergy & Precautions, H&P , NPO status , Patient's Chart, lab work & pertinent test results  Airway Mallampati: III  TM Distance: >3 FB Neck ROM: Full    Dental no notable dental hx. (+) Teeth Intact, Dental Advisory Given   Pulmonary neg pulmonary ROS   Pulmonary exam normal breath sounds clear to auscultation       Cardiovascular hypertension, Pt. on medications  Rhythm:Regular Rate:Normal     Neuro/Psych   Anxiety Depression    negative neurological ROS     GI/Hepatic Neg liver ROS,GERD  ,,  Endo/Other  diabetes, Type 2, Oral Hypoglycemic Agents, Insulin Dependent  Morbid obesity  Renal/GU negative Renal ROS  negative genitourinary   Musculoskeletal  (+) Arthritis ,    Abdominal   Peds  Hematology  (+) Blood dyscrasia, anemia   Anesthesia Other Findings   Reproductive/Obstetrics negative OB ROS                             Anesthesia Physical Anesthesia Plan  ASA: 3  Anesthesia Plan: General   Post-op Pain Management: Tylenol PO (pre-op)*   Induction: Intravenous  PONV Risk Score and Plan: 4 or greater and Ondansetron, Dexamethasone and Midazolam  Airway Management Planned: LMA and Oral ETT  Additional Equipment:   Intra-op Plan:   Post-operative Plan: Extubation in OR  Informed Consent: I have reviewed the patients History and Physical, chart, labs and discussed the procedure including the risks, benefits and alternatives for the proposed anesthesia with the patient or authorized representative who has indicated his/her understanding and acceptance.     Dental advisory given  Plan Discussed with: CRNA  Anesthesia Plan Comments:        Anesthesia Quick Evaluation

## 2022-09-21 NOTE — Interval H&P Note (Signed)
History and Physical Interval Note:  09/21/2022 2:56 PM  April Gonzales  has presented today for surgery, with the diagnosis of RIGHT URETERAL PELVIC JUNCTION/ PROXIMAL URETERAL STONE.  The various methods of treatment have been discussed with the patient and family. After consideration of risks, benefits and other options for treatment, the patient has consented to  Procedure(s) with comments: CYSTOSCOPY RIGHT URETEROSCOPY/HOLMIUM LASER/STENT PLACEMENT (Right) - 75 MINS FOR CASE as a surgical intervention.  The patient's history has been reviewed, patient examined, no change in status, stable for surgery. She has not had a lot of pain recently. She hasn't seen a stone pass. We discussed delay with definitive imaging (repeat CT) vs cysto, rt RGP and attempt right URS/HLL if indicated. She elects to proceed as planned. Discussed she may need a staged procedure/prestent. I have reviewed the patient's chart and labs.  Questions were answered to the patient's satisfaction.  She elects to proceed. She did start the abx and has not had fever or dysuria. No gross hematuria.    April Gonzales

## 2022-09-21 NOTE — Discharge Instructions (Signed)
removal of the stent-remove the stent on Monday morning, September 27, 2022 by pulling the string as instructed.

## 2022-09-21 NOTE — Op Note (Signed)
Preoperative diagnosis: Right ureteral stone Postoperative diagnosis: Same  Procedure: Cystoscopy with right retrograde pyelogram, right ureteroscopy laser lithotripsy, right ureteral stent placement  Surgeon: Mena Goes  Anesthesia: General  Indication for procedure: April Gonzales is a 47 year old female with an 8 mm right proximal stone.  Stone failed to pass.  Findings: The urethra and bladder were unremarkable although the meatus was a bit tight.  I had to pass the smaller 21 [place] sheath with the obturator.  Bladder was unremarkable with no stone or foreign body in the bladder.  No mucosal lesion.  Right retrograde pyelogram-this outlined a filling defect in the mid ureter just above the iliacs with proximal dilation of the collecting system.   On ureteroscopy the stone was noted in the right mid ureter.  The distal ureter was a bit tight and it was not easy once the stone was fragmented with any stone fragments or dust in the ureter to get back in or out and I could not get an access sheath up.  I plan to maybe look into the kidney if I could but ended up placing a stent.  Description of procedure: After consent was obtained patient brought to the operating room.  After adequate anesthesia she is placed in lithotomy position and prepped and draped in the usual sterile fashion.  Timeout was performed to confirm the patient and procedure.  The cystoscope was passed per urethra but first I needed to pass the 21 [place] scope with the obturator to gently dilate the urethra.  The cystoscope was then passed and the bladder carefully inspected.  The right ureteral orifice was cannulated with a 5 [place] open-ended catheter and retrograde injection of contrast was performed.  A sensor wire was advanced and coiled up into the right collecting system.  I then passed a dual channel semirigid scope but it would not quite exit the distal ureter up to the mid.  I backed it out and over the sensor wire advanced the  inner part of an access sheath which gently dilated the ureter and now allowed the semirigid scope to pass to get up to the stone.  A 200 m laser fiber was advanced where the stone was dusted.  No significant stone fragments remain.  I carefully backed the scope out after passing a second the zip wire through the scope.  Again I did not see any significant stone fragments in well up toward the proximal ureter and out noted no injury. I then passed an access sheath but it would not advance.  I then passed the inner cannula and it would not advance.  Therefore I repassed the semirigid to check for any stone fragments blocking the access sheath and it was just the ureter was a bit tight in the distal ureter.  I think she had some swelling and edema from passing the semirigid.  Therefore thought it was best to simply leave a stent.  I do not think it would have been easy to basket out any fragments.  The zip wire was again passed under direct vision and the semirigid backed out.  The zip wire was backloaded on the cystoscope and a 624 cm stent advanced.  The wire was removed with a good coil seen in the kidney and a good coil in the bladder.  The sensor wire was removed under direct vision.  Good drainage of the stent.  This scope was removed.  The stent was secured to the patient.  She was awakened taken recovery room in  stable condition.  Complications: None  Blood loss: Minimal  Specimens: None  Drains: 6 x 24 cm right ureteral stent with string  Disposition: Patient stable to PACU.

## 2022-09-22 ENCOUNTER — Other Ambulatory Visit: Payer: Self-pay

## 2022-09-22 ENCOUNTER — Emergency Department (HOSPITAL_BASED_OUTPATIENT_CLINIC_OR_DEPARTMENT_OTHER)
Admission: EM | Admit: 2022-09-22 | Discharge: 2022-09-22 | Disposition: A | Discharge status: Home / Self Care | Payer: Commercial Managed Care - HMO | Attending: Emergency Medicine | Admitting: Emergency Medicine

## 2022-09-22 ENCOUNTER — Emergency Department (HOSPITAL_BASED_OUTPATIENT_CLINIC_OR_DEPARTMENT_OTHER): Payer: Commercial Managed Care - HMO

## 2022-09-22 ENCOUNTER — Telehealth: Payer: Self-pay

## 2022-09-22 ENCOUNTER — Encounter (HOSPITAL_BASED_OUTPATIENT_CLINIC_OR_DEPARTMENT_OTHER): Payer: Self-pay

## 2022-09-22 DIAGNOSIS — E119 Type 2 diabetes mellitus without complications: Secondary | ICD-10-CM | POA: Insufficient documentation

## 2022-09-22 DIAGNOSIS — Y732 Prosthetic and other implants, materials and accessory gastroenterology and urology devices associated with adverse incidents: Secondary | ICD-10-CM | POA: Insufficient documentation

## 2022-09-22 DIAGNOSIS — T83122A Displacement of urinary stent, initial encounter: Secondary | ICD-10-CM | POA: Insufficient documentation

## 2022-09-22 DIAGNOSIS — R350 Frequency of micturition: Secondary | ICD-10-CM | POA: Diagnosis present

## 2022-09-22 DIAGNOSIS — I10 Essential (primary) hypertension: Secondary | ICD-10-CM | POA: Diagnosis not present

## 2022-09-22 LAB — CBC
HCT: 40.6 % (ref 36.0–46.0)
Hemoglobin: 13.4 g/dL (ref 12.0–15.0)
MCH: 25.7 pg — ABNORMAL LOW (ref 26.0–34.0)
MCHC: 33 g/dL (ref 30.0–36.0)
MCV: 77.8 fL — ABNORMAL LOW (ref 80.0–100.0)
Platelets: 334 10*3/uL (ref 150–400)
RBC: 5.22 MIL/uL — ABNORMAL HIGH (ref 3.87–5.11)
RDW: 14.6 % (ref 11.5–15.5)
WBC: 12.7 10*3/uL — ABNORMAL HIGH (ref 4.0–10.5)
nRBC: 0 % (ref 0.0–0.2)

## 2022-09-22 LAB — URINALYSIS, ROUTINE W REFLEX MICROSCOPIC
Bacteria, UA: NONE SEEN
Bilirubin Urine: NEGATIVE
Glucose, UA: >1000 mg/dL — AB
Ketones, ur: 15 mg/dL — AB
Leukocytes,Ua: NEGATIVE
Nitrite: NEGATIVE
Specific Gravity, Urine: 1.037 — ABNORMAL HIGH (ref 1.005–1.030)
pH: 5.5 (ref 5.0–8.0)

## 2022-09-22 LAB — BASIC METABOLIC PANEL
Anion gap: 15 (ref 5–15)
BUN: 17 mg/dL (ref 6–20)
CO2: 21 mmol/L — ABNORMAL LOW (ref 22–32)
Calcium: 9.1 mg/dL (ref 8.9–10.3)
Chloride: 98 mmol/L (ref 98–111)
Creatinine, Ser: 1.01 mg/dL — ABNORMAL HIGH (ref 0.44–1.00)
GFR, Estimated: >60 mL/min (ref >60)
Glucose, Bld: 358 mg/dL — ABNORMAL HIGH (ref 70–99)
Potassium: 4.2 mmol/L (ref 3.5–5.1)
Sodium: 134 mmol/L — ABNORMAL LOW (ref 135–145)

## 2022-09-22 MED ORDER — OXYCODONE-ACETAMINOPHEN 5-325 MG PO TABS
1.0000 | ORAL_TABLET | Freq: Once | ORAL | Status: AC
  Administered 2022-09-22: 1 via ORAL
  Filled 2022-09-22: qty 1

## 2022-09-22 NOTE — ED Notes (Signed)
Reviewed AVS with patient, patient expressed understanding of directions, denies further questions at this time. 

## 2022-09-22 NOTE — Discharge Instructions (Signed)
You were evaluated in the Emergency Department and after careful evaluation, we did not find any emergent condition requiring admission or further testing in the hospital.  Your exam/testing today was overall reassuring.  Symptoms like the due to displacement of your ureteral stent.  Recommend follow-up with urology to discuss what happened and any lingering symptoms.  Please return to the Emergency Department if you experience any worsening of your condition.  Thank you for allowing [place] to be a part of your care.

## 2022-09-22 NOTE — ED Notes (Signed)
MD Belo at bedside to remove partially protruding stent, stent noted to be entirely out at this time.

## 2022-09-22 NOTE — ED Triage Notes (Signed)
Patient had cystoscopy/utereroscopy/holmium laser/stent placement yesterday, discharged at 7pm, states since being discharged has been incontinent of urine consistently, "it's just leaking out." No loss of sensation per patient.

## 2022-09-22 NOTE — ED Provider Notes (Addendum)
[facility] Emergency Department Provider Note MRN:  [id number]  Arrival date & time: 09/22/22     Chief Complaint   Urinary Frequency   History of Present Illness   April Gonzales is a 47 y.o. year-old female with a history of diabetes presenting to the ED with chief complaint of urinary frequency.  Patient had a stent placed in her ureter yesterday.  Has been incontinent of urine since, having to wear pads because the urine is just leaking out.  Denies numbness or weakness to the arms or legs, mild flank pain that is not worsening.  No fever.  Review of Systems  A thorough review of systems was obtained and all systems are negative except as noted in the HPI and PMH.   Patient's Health History    Past Medical History:  Diagnosis Date   Anemia    Anxiety    Arthritis    B12 deficiency    Back pain    Celiac disease    Chronic fatigue syndrome    Constipation    Depression    DM (diabetes mellitus) (HCC)    Gallbladder problem    GERD (gastroesophageal reflux disease)    Heartburn    History of kidney stones    Hypertension    Joint pain    Pancreatitis    Psoriasis    SOBOE (shortness of breath on exertion)    Vitamin D deficiency     Past Surgical History:  Procedure Laterality Date   CHOLECYSTECTOMY     CYSTOSCOPY/URETEROSCOPY/HOLMIUM LASER/STENT PLACEMENT Right 09/21/2022   Procedure: CYSTOSCOPY RIGHT URETEROSCOPY/HOLMIUM LASER/STENT PLACEMENT;  Surgeon: [name], MD;  Location: [facility];  Service: Urology;  Laterality: Right;  75 MINS FOR CASE   WISDOM TOOTH EXTRACTION      Family History  Problem Relation Age of Onset   Hyperlipidemia Mother    Diabetes Mother    Diabetes Mellitus II Mother    Hypertension Mother    Obesity Mother    Obesity Father    Hypertension Father    CAD Father    Chronic Renal Failure Father    Hyperlipidemia Father    Heart disease Father    Kidney disease Father    Alcoholism Father      Social History   Socioeconomic History   Marital status: Single    Spouse name: Not on file   Number of children: Not on file   Years of education: Not on file   Highest education level: Not on file  Occupational History   Occupation: Currently on leave of absence  Tobacco Use   Smoking status: Never   Smokeless tobacco: Never  Vaping Use   Vaping Use: Never used  Substance and Sexual Activity   Alcohol use: No   Drug use: No   Sexual activity: Not on file  Other Topics Concern   Not on file  Social History Narrative   Not on file   Social Determinants of Health   Financial Resource Strain: Not on file  Food Insecurity: Not on file  Transportation Needs: Not on file  Physical Activity: Not on file  Stress: Not on file  Social Connections: Not on file  Intimate Partner Violence: Not on file     Physical Exam   Vitals:   09/22/22 0545 09/22/22 0600  BP: (!) 200/94 (!) 175/90  Pulse: 92 81  Resp:  18  Temp:    SpO2: 98% 96%    CONSTITUTIONAL: Well-appearing,  appears a bit anxious NEURO/PSYCH:  Alert and oriented x 3, no focal deficits EYES:  eyes equal and reactive ENT/NECK:  no LAD, no JVD CARDIO: Tachycardic rate, well-perfused, normal S1 and S2 PULM:  CTAB no wheezing or rhonchi GI/GU:  non-distended, non-tender MSK/SPINE:  No gross deformities, no edema SKIN:  no rash, atraumatic   *Additional and/or pertinent findings included in MDM below  Diagnostic and Interventional Summary    EKG Interpretation  Date/Time:    Ventricular Rate:    PR Interval:    QRS Duration:   QT Interval:    QTC Calculation:   R Axis:     Text Interpretation:         Labs Reviewed  CBC - Abnormal; Notable for the following components:      Result Value   WBC 12.7 (*)    RBC 5.22 (*)    MCV 77.8 (*)    MCH 25.7 (*)    All other components within normal limits  BASIC METABOLIC PANEL - Abnormal; Notable for the following components:   Sodium 134 (*)    CO2 21  (*)    Glucose, Bld 358 (*)    Creatinine, Ser 1.01 (*)    All other components within normal limits  URINALYSIS, ROUTINE W REFLEX MICROSCOPIC - Abnormal; Notable for the following components:   Specific Gravity, Urine 1.037 (*)    Glucose, UA >1,000 (*)    Hgb urine dipstick MODERATE (*)    Ketones, ur 15 (*)    Protein, ur TRACE (*)    All other components within normal limits    CT RENAL STONE STUDY  Final Result      Medications  oxyCODONE-acetaminophen (PERCOCET/ROXICET) 5-325 MG per tablet 1 tablet (1 tablet Oral Given 09/22/22 0504)     Procedures  /  Critical Care .Foreign Body Removal  Date/Time: 09/22/2022 6:42 AM  Performed by: Sabas Sous, MD Authorized by: Sabas Sous, MD  Consent: Verbal consent obtained. Consent given by: patient Patient understanding: patient states understanding of the procedure being performed Imaging studies: imaging studies available Patient identity confirmed: verbally with patient Intake: urethra.  Sedation: Patient sedated: no  Complexity: simple 1 objects recovered. Objects recovered: ureteral stent Post-procedure assessment: foreign body removed Patient tolerance: patient tolerated the procedure well with no immediate complications    ED Course and Medical Decision Making  Initial Impression and Ddx Differential diagnosis includes UTI, migration of the stent, bladder spasm  Past medical/surgical history that increases complexity of ED encounter: Kidney stones  Interpretation of Diagnostics I personally reviewed the laboratory assessment and my interpretation is as follows: No significant blood count or electrolyte disturbance  CT revealing migration of the stent out of the ureter into the bladder extending through the urethra and with a large portion of it external to the patient.  Patient Reassessment and Ultimate Disposition/Management     Patient informed nursing staff that she went to urinate and passed  something blew out of her urethra.  On my repeat exam patient had clearly voided the stent, which was completely out and only connected to the patient by the strings which were taped to the patient's pannus.  The stent was removed and discarded.  Patient's incontinence then of course resolved, she felt well with normal vitals.  With this and reassuring workup, she is appropriate for discharge, Dr. Mena Goes messaged with what has happened, [facility]  Patient management required discussion with the following services or  consulting groups:  None  Complexity of Problems Addressed Acute illness or injury that poses threat of life of bodily function  Additional Data Reviewed and Analyzed Further history obtained from: Recent Consult notes  Additional Factors Impacting ED Encounter Risk None  Elmer Sow. Pilar Plate, MD [facility] Emergency Medicine [facility] [email or web address]  Final Clinical Impressions(s) / ED Diagnoses     ICD-10-CM   1. Displacement of ureteral stent, initial encounter [facility])  Z61.096E       ED Discharge Orders     None        Discharge Instructions Discussed with and Provided to Patient:     Discharge Instructions      You were evaluated in the Emergency Department and after careful evaluation, we did not find any emergent condition requiring admission or further testing in the hospital.  Your exam/testing today was overall reassuring.  Symptoms like the due to displacement of your ureteral stent.  Recommend follow-up with urology to discuss what happened and any lingering symptoms.  Please return to the Emergency Department if you experience any worsening of your condition.  Thank you for allowing [place] to be a part of your care.        Sabas Sous, MD 09/22/22 4540    Sabas Sous, MD 09/22/22 [phone]

## 2022-09-22 NOTE — Transitions of Care (Post Inpatient/ED Visit) (Signed)
   09/22/2022  Name: April Gonzales MRN: [id number] DOB: 23-Aug-1975  Today's TOC FU Call Status: Today's TOC FU Call Status:: Unsuccessul Call (1st Attempt) Unsuccessful Call (1st Attempt) Date: 09/22/22  Attempted to reach the patient regarding the most recent Inpatient/ED visit.  Follow Up Plan: Additional outreach attempts will be made to reach the patient to complete the Transitions of Care (Post Inpatient/ED visit) call.   Signature Karena Addison, LPN [facility] Nurse Health Advisor Direct Dial [phone]

## 2022-09-24 ENCOUNTER — Encounter: Payer: Self-pay | Admitting: Nurse Practitioner

## 2022-09-24 NOTE — Transitions of Care (Post Inpatient/ED Visit) (Signed)
   09/24/2022  Name: April Gonzales MRN: [id number] DOB: 1975/11/10  Today's TOC FU Call Status: Today's TOC FU Call Status:: Unsuccessful Call (2nd Attempt) Unsuccessful Call (1st Attempt) Date: 09/22/22 Unsuccessful Call (2nd Attempt) Date: 09/24/22  Attempted to reach the patient regarding the most recent Inpatient/ED visit.  Follow Up Plan: No further outreach attempts will be made at this time. We have been unable to contact the patient.  Signature Karena Addison, LPN [facility] Nurse Health Advisor Direct Dial [phone]

## 2022-09-28 ENCOUNTER — Encounter: Payer: Self-pay | Admitting: Family Medicine

## 2022-09-28 ENCOUNTER — Ambulatory Visit (INDEPENDENT_AMBULATORY_CARE_PROVIDER_SITE_OTHER): Payer: Commercial Managed Care - HMO | Admitting: Family Medicine

## 2022-09-28 ENCOUNTER — Other Ambulatory Visit (INDEPENDENT_AMBULATORY_CARE_PROVIDER_SITE_OTHER): Payer: Self-pay | Admitting: Physician Assistant

## 2022-09-28 VITALS — BP 148/94 | HR 82 | Temp 97.8°F | Ht 65.0 in | Wt 297.0 lb

## 2022-09-28 DIAGNOSIS — F32A Depression, unspecified: Secondary | ICD-10-CM

## 2022-09-28 DIAGNOSIS — I10 Essential (primary) hypertension: Secondary | ICD-10-CM

## 2022-09-28 DIAGNOSIS — E538 Deficiency of other specified B group vitamins: Secondary | ICD-10-CM | POA: Diagnosis not present

## 2022-09-28 DIAGNOSIS — N200 Calculus of kidney: Secondary | ICD-10-CM

## 2022-09-28 DIAGNOSIS — F419 Anxiety disorder, unspecified: Secondary | ICD-10-CM | POA: Diagnosis not present

## 2022-09-28 DIAGNOSIS — Z8719 Personal history of other diseases of the digestive system: Secondary | ICD-10-CM | POA: Diagnosis not present

## 2022-09-28 DIAGNOSIS — Z9189 Other specified personal risk factors, not elsewhere classified: Secondary | ICD-10-CM

## 2022-09-28 DIAGNOSIS — E559 Vitamin D deficiency, unspecified: Secondary | ICD-10-CM

## 2022-09-28 MED ORDER — VALSARTAN 160 MG PO TABS
160.0000 mg | ORAL_TABLET | Freq: Every day | ORAL | 1 refills | Status: DC
Start: 2022-09-28 — End: 2023-03-28

## 2022-09-28 NOTE — Patient Instructions (Addendum)
Your blood pressure is not in goal range.    Stop lisinopril and start new prescription valsartan.   Please monitor your blood pressure at home.  Follow-up if your blood pressure is consistently higher than 130/80.  Please call [facility] and see if you can get a sooner appointment with your urologist as we discussed.   Continue to follow-up with [facility], your endocrinologist and other specialists.     DASH Eating Plan DASH stands for Dietary Approaches to Stop Hypertension. The DASH eating plan is a healthy eating plan that has been shown to: Lower high blood pressure (hypertension). Reduce your risk for type 2 diabetes, heart disease, and stroke. Help with weight loss. What are tips for following this plan? Reading food labels Check food labels for the amount of salt (sodium) per serving. Choose foods with less than 5 percent of the Daily Value (DV) of sodium. In general, foods with less than 300 milligrams (mg) of sodium per serving fit into this eating plan. To find whole grains, look for the word "whole" as the first word in the ingredient list. Shopping Buy products labeled as "low-sodium" or "no salt added." Buy fresh foods. Avoid canned foods and pre-made or frozen meals. Cooking Try not to add salt when you cook. Use salt-free seasonings or herbs instead of table salt or sea salt. Check with your health care provider or pharmacist before using salt substitutes. Do not fry foods. Cook foods in healthy ways, such as baking, boiling, grilling, roasting, or broiling. Cook using oils that are good for your heart. These include olive, canola, avocado, soybean, and sunflower oil. Meal planning  Eat a balanced diet. This should include: 4 or more servings of fruits and 4 or more servings of vegetables each day. Try to fill half of your plate with fruits and vegetables. 6-8 servings of whole grains each day. 6 or less servings of lean meat, poultry,  or fish each day. 1 oz is 1 serving. A 3 oz (85 g) serving of meat is about the same size as the palm of your hand. One egg is 1 oz (28 g). 2-3 servings of low-fat dairy each day. One serving is 1 cup (237 mL). 1 serving of nuts, seeds, or beans 5 times each week. 2-3 servings of heart-healthy fats. Healthy fats called omega-3 fatty acids are found in foods such as walnuts, flaxseeds, fortified milks, and eggs. These fats are also found in cold-water fish, such as sardines, salmon, and mackerel. Limit how much you eat of: Canned or prepackaged foods. Food that is high in trans fat, such as fried foods. Food that is high in saturated fat, such as fatty meat. Desserts and other sweets, sugary drinks, and other foods with added sugar. Full-fat dairy products. Do not salt foods before eating. Do not eat more than 4 egg yolks a week. Try to eat at least 2 vegetarian meals a week. Eat more home-cooked food and less restaurant, buffet, and fast food. Lifestyle When eating at a restaurant, ask if your food can be made with less salt or no salt. If you drink alcohol: Limit how much you have to: 0-1 drink a day if you are female. 0-2 drinks a day if you are female. Know how much alcohol is in your drink. In the [place] one drink is one 12 oz bottle of beer (355 mL), one 5 oz glass of wine (148 mL), or one 1 oz glass of hard liquor (44 mL).  General information Avoid eating more than 2,300 mg of salt a day. If you have hypertension, you may need to reduce your sodium intake to 1,500 mg a day. Work with your provider to stay at a healthy body weight or lose weight. Ask what the best weight range is for you. On most days of the week, get at least 30 minutes of exercise that causes your heart to beat faster. This may include walking, swimming, or biking. Work with your provider or dietitian to adjust your eating plan to meet your specific calorie needs. What foods should I eat? Fruits All fresh, dried, or  frozen fruit. Canned fruits that are in their natural juice and do not have sugar added to them. Vegetables Fresh or frozen vegetables that are raw, steamed, roasted, or grilled. Low-sodium or reduced-sodium tomato and vegetable juice. Low-sodium or reduced-sodium tomato sauce and tomato paste. Low-sodium or reduced-sodium canned vegetables. Grains Whole-grain or whole-wheat bread. Whole-grain or whole-wheat pasta. Brown rice. Orpah Cobb. Bulgur. Whole-grain and low-sodium cereals. Pita bread. Low-fat, low-sodium crackers. Whole-wheat flour tortillas. Meats and other proteins Skinless chicken or [place]. Ground chicken or [place]. Pork with fat trimmed off. Fish and seafood. Egg whites. Dried beans, peas, or lentils. Unsalted nuts, nut butters, and seeds. Unsalted canned beans. Lean cuts of beef with fat trimmed off. Low-sodium, lean precooked or cured meat, such as sausages or meat loaves. Dairy Low-fat (1%) or fat-free (skim) milk. Reduced-fat, low-fat, or fat-free cheeses. Nonfat, low-sodium ricotta or cottage cheese. Low-fat or nonfat yogurt. Low-fat, low-sodium cheese. Fats and oils Soft margarine without trans fats. Vegetable oil. Reduced-fat, low-fat, or light mayonnaise and salad dressings (reduced-sodium). Canola, safflower, olive, avocado, soybean, and sunflower oils. Avocado. Seasonings and condiments Herbs. Spices. Seasoning mixes without salt. Other foods Unsalted popcorn and pretzels. Fat-free sweets. The items listed above may not be all the foods and drinks you can have. Talk to a dietitian to learn more. What foods should I avoid? Fruits Canned fruit in a light or heavy syrup. Fried fruit. Fruit in cream or butter sauce. Vegetables Creamed or fried vegetables. Vegetables in a cheese sauce. Regular canned vegetables that are not marked as low-sodium or reduced-sodium. Regular canned tomato sauce and paste that are not marked as low-sodium or reduced-sodium. Regular tomato and  vegetable juices that are not marked as low-sodium or reduced-sodium. Rosita Fire. Olives. Grains Baked goods made with fat, such as croissants, muffins, or some breads. Dry pasta or rice meal packs. Meats and other proteins Fatty cuts of meat. Ribs. Fried meat. Tomasa Blase. Bologna, salami, and other precooked or cured meats, such as sausages or meat loaves, that are not lean and low in sodium. Fat from the back of a pig (fatback). Bratwurst. Salted nuts and seeds. Canned beans with added salt. Canned or smoked fish. Whole eggs or egg yolks. Chicken or [place] with skin. Dairy Whole or 2% milk, cream, and half-and-half. Whole or full-fat cream cheese. Whole-fat or sweetened yogurt. Full-fat cheese. Nondairy creamers. Whipped toppings. Processed cheese and cheese spreads. Fats and oils Butter. Stick margarine. Lard. Shortening. Ghee. Bacon fat. Tropical oils, such as coconut, palm kernel, or palm oil. Seasonings and condiments Onion salt, garlic salt, seasoned salt, table salt, and sea salt. Worcestershire sauce. Tartar sauce. Barbecue sauce. Teriyaki sauce. Soy sauce, including reduced-sodium soy sauce. Steak sauce. Canned and packaged gravies. Fish sauce. Oyster sauce. Cocktail sauce. Store-bought horseradish. Ketchup. Mustard. Meat flavorings and tenderizers. Bouillon cubes. Hot sauces. Pre-made or packaged marinades. Pre-made or packaged taco seasonings. Relishes.  Regular salad dressings. Other foods Salted popcorn and pretzels. The items listed above may not be all the foods and drinks you should avoid. Talk to a dietitian to learn more. Where to find more information National Heart, Lung, and Blood Institute (NHLBI): BuffaloDryCleaner.gl American Heart Association (AHA): heart.org Academy of Nutrition and Dietetics: eatright.org National Kidney Foundation (NKF): kidney.org This information is not intended to replace advice given to you by your health care provider. Make sure you discuss any questions you have  with your health care provider. Document Revised: 04/22/2022 Document Reviewed: 04/22/2022 Elsevier Patient Education  2024 [employer].

## 2022-09-28 NOTE — Progress Notes (Unsigned)
Subjective:     Patient ID: April Gonzales, female    DOB: Feb 13, 1976, 47 y.o.   MRN: [id number]  Chief Complaint  Patient presents with   Medical Management of Chronic Issues    3 month f/u, blood sugars in mornings around 150/180 nights 200/300  Kidney stone surgery on 6/4, thought they got it all but was not sure due to medical notes. Kept needing to go to bathroom and stent was dislodged and fell out at emergency room and is not sure about kidney stones because still having pain    HPI  Discussed the use of AI scribe software for clinical note transcription with the patient, who gave verbal consent to proceed.  History of Present Illness          She is here for a follow up on chronic health conditions.   HTN- does not check BP at home. States her new BP machine is complicated and she does not know how to use it. Reports good compliance with lisinopril.   Last A1c 10.8% on 09/08/2022 Now seeing an endocrinologist, NP Ronny Bacon.  She is also seeing [facility]    Recent renal stone procedure last week. She went to the ED on 09/22/2022 due to urinary incontinence and discomfort and the ureteral stent dislodged and came out while in the ED per patient.  Dr. Mena Goes is her urologist.    She has a psychiatrist with [facility].  She also has a therapist.  Her mood is improving.   Spine and Scoliosis - on gabapentin  Moderate spinal stenosis.   States her back specialist and psychiatrist have filled out disability paperwork.   Health Maintenance Due  Topic Date Due   OPHTHALMOLOGY EXAM  Never done   HIV Screening  Never done   Hepatitis C Screening  Never done   PAP SMEAR-Modifier  Never done   Colonoscopy  Never done    Past Medical History:  Diagnosis Date   Anemia    Anxiety    Arthritis    B12 deficiency    Back pain    Celiac disease    Chronic fatigue syndrome    Constipation    Depression    DM (diabetes mellitus) (HCC)    Gallbladder problem     GERD (gastroesophageal reflux disease)    Heartburn    History of kidney stones    Hypertension    Joint pain    Pancreatitis    Psoriasis    SOBOE (shortness of breath on exertion)    Vitamin D deficiency     Past Surgical History:  Procedure Laterality Date   CHOLECYSTECTOMY     CYSTOSCOPY/URETEROSCOPY/HOLMIUM LASER/STENT PLACEMENT Right 09/21/2022   Procedure: CYSTOSCOPY RIGHT URETEROSCOPY/HOLMIUM LASER/STENT PLACEMENT;  Surgeon: [name], MD;  Location: [facility];  Service: Urology;  Laterality: Right;  75 MINS FOR CASE   WISDOM TOOTH EXTRACTION      Family History  Problem Relation Age of Onset   Hyperlipidemia Mother    Diabetes Mother    Diabetes Mellitus II Mother    Hypertension Mother    Obesity Mother    Obesity Father    Hypertension Father    CAD Father    Chronic Renal Failure Father    Hyperlipidemia Father    Heart disease Father    Kidney disease Father    Alcoholism Father     Social History   Socioeconomic History   Marital status: Single    Spouse name: Not  on file   Number of children: Not on file   Years of education: Not on file   Highest education level: Associate degree: academic program  Occupational History   Occupation: Currently on leave of absence  Tobacco Use   Smoking status: Never   Smokeless tobacco: Never  Vaping Use   Vaping Use: Never used  Substance and Sexual Activity   Alcohol use: No   Drug use: No   Sexual activity: Not on file  Other Topics Concern   Not on file  Social History Narrative   Not on file   Social Determinants of Health   Financial Resource Strain: Low Risk  (09/24/2022)   Overall Financial Resource Strain (CARDIA)    Difficulty of Paying Living Expenses: Not hard at all  Food Insecurity: Food Insecurity Present (09/24/2022)   Hunger Vital Sign    Worried About Running Out of Food in the Last Year: Sometimes true    Ran Out of Food in the Last Year: Never true  Transportation Needs: No  Transportation Needs (09/24/2022)   PRAPARE - Administrator, Civil Service (Medical): No    Lack of Transportation (Non-Medical): No  Physical Activity: Insufficiently Active (09/24/2022)   Exercise Vital Sign    Days of Exercise per Week: 2 days    Minutes of Exercise per Session: 10 min  Stress: Stress Concern Present (09/24/2022)   [employer] of Occupational Health - Occupational Stress Questionnaire    Feeling of Stress : Rather much  Social Connections: Socially Isolated (09/24/2022)   Social Connection and Isolation Panel [NHANES]    Frequency of Communication with Friends and Family: Once a week    Frequency of Social Gatherings with Friends and Family: Once a week    Attends Religious Services: Never    Database administrator or Organizations: No    Attends Engineer, structural: Not on file    Marital Status: Never married  Intimate Partner Violence: Not on file    Outpatient Medications Prior to Visit  Medication Sig Dispense Refill   busPIRone (BUSPAR) 5 MG tablet Take 5 mg by mouth 2 (two) times daily.     cephALEXin (KEFLEX) 500 MG capsule Take 1 capsule (500 mg total) by mouth daily. 10 capsule 0   Dulaglutide (TRULICITY) 1.5 MG/0.5ML SOPN Inject 1.5 mg into the skin once a week. 6 mL 0   escitalopram (LEXAPRO) 20 MG tablet Take 1 tablet (20 mg total) by mouth daily. 30 tablet 2   glipiZIDE (GLUCOTROL XL) 5 MG 24 hr tablet TAKE 1 TABLET BY MOUTH EVERY DAY WITH BREAKFAST 90 tablet 1   hydrocortisone-pramoxine (PROCTOFOAM-HC) rectal foam Place 1 applicator rectally 2 (two) times daily. 10 g 1   Insulin Pen Needle (PEN NEEDLES) 31G X 8 MM MISC Use to inject insulin once daily 100 each 3   metFORMIN (GLUCOPHAGE) 1000 MG tablet TAKE 1 TABLET (1,000 MG TOTAL) BY MOUTH TWICE A DAY WITH FOOD 180 tablet 1   ondansetron (ZOFRAN-ODT) 8 MG disintegrating tablet Take 1 tablet (8 mg total) by mouth every 8 (eight) hours as needed for nausea or vomiting. 10 tablet 20    oxyCODONE-acetaminophen (PERCOCET) 10-325 MG tablet Take 1 tablet by mouth every 6 (six) hours as needed for pain (may cause constipation). 20 tablet 0   pregabalin (LYRICA) 75 MG capsule Take 75 mg by mouth 2 (two) times daily as needed (pain).     Risankizumab-rzaa (SKYRIZI) 150 MG/ML SOSY Inject 150  mg into the skin every 3 (three) months.     Tapinarof (VTAMA) 1 % CREA Apply 1 Application topically daily as needed (psoriasis).     triamcinolone cream (KENALOG) 0.1 % Apply 1 application topically 2 (two) times daily as needed (psoriasis).      Urea 45 % CREA Apply 1 Application topically daily as needed (irritation).     Vitamin D, Ergocalciferol, (DRISDOL) 1.25 MG (50000 UNIT) CAPS capsule TAKE 1 CAPSULE (50,000 UNITS TOTAL) BY MOUTH TWO TIMES A WEEK 8 capsule 0   lisinopril (ZESTRIL) 40 MG tablet Take 1 tablet (40 mg total) by mouth daily. 90 tablet 1   Insulin Glargine (BASAGLAR KWIKPEN) 100 UNIT/ML Inject 30 Units into the skin at bedtime. (Patient not taking: Reported on 09/28/2022) 30 mL 3   tamsulosin (FLOMAX) 0.4 MG CAPS capsule Take 1 capsule daily until stone passes. (Patient not taking: Reported on 09/28/2022) 30 capsule 0   No facility-administered medications prior to visit.    Allergies  Allergen Reactions   Dilaudid [Hydromorphone Hcl] Nausea And Vomiting   Hydromorphone     Other reaction(s): Unknown   Methotrexate Other (See Comments)    Childhood reaction, foaming at the mouth    Review of Systems  Constitutional:  Negative for chills, fever and malaise/fatigue.  Respiratory:  Negative for shortness of breath.   Cardiovascular:  Negative for chest pain, palpitations and leg swelling.  Gastrointestinal:  Negative for abdominal pain, blood in stool, constipation, diarrhea, nausea and vomiting.  Genitourinary:  Negative for dysuria, frequency and urgency.  Musculoskeletal:  Positive for back pain.  Neurological:  Negative for dizziness and focal weakness.        Objective:    Physical Exam Constitutional:      General: She is not in acute distress.    Appearance: She is obese. She is not ill-appearing.  Eyes:     Extraocular Movements: Extraocular movements intact.     Conjunctiva/sclera: Conjunctivae normal.  Cardiovascular:     Rate and Rhythm: Normal rate.  Pulmonary:     Effort: Pulmonary effort is normal.  Musculoskeletal:     Cervical back: Normal range of motion and neck supple.  Skin:    General: Skin is warm and dry.  Neurological:     General: No focal deficit present.     Mental Status: She is alert and oriented to person, place, and time.  Psychiatric:        Mood and Affect: Mood normal.        Behavior: Behavior normal.        Thought Content: Thought content normal.      BP (!) 148/94 (BP Location: Left Arm, Patient Position: Sitting, Cuff Size: Large)   Pulse 82   Temp 97.8 F (36.6 C) (Temporal)   Ht 5\' 5"  (1.651 m)   Wt 297 lb (134.7 kg)   LMP 08/27/2022 (Approximate)   SpO2 99%   BMI 49.42 kg/m  Wt Readings from Last 3 Encounters:  09/28/22 297 lb (134.7 kg)  09/22/22 298 lb (135.2 kg)  09/21/22 298 lb (135.2 kg)       Assessment & Plan:   Problem List Items Addressed This Visit       Cardiovascular and Mediastinum   Essential hypertension - Primary    BP not well controlled. Stop lisinopril and start valsartan. Work on healthy, lower sodium diet. She has a BP machine and may bring it in for a nurse visit so we can help her  learn how to use it.  Reviewed recent labs from hospital visit.  Continue follow up with specialists. Follow up here in 3 months or sooner if HTN does not improve.       Relevant Medications   valsartan (DIOVAN) 160 MG tablet     Genitourinary   Renal stones    She will call and schedule appt with urologist.         Other   Anxiety and depression    She is now under the care of a psychiatrist through [facility].  She also has a therapist.  Her mood is good.      At  risk for obstructive sleep apnea    Awaiting sleep study       B12 deficiency    Recommend she continue oral B12      History of pancreatitis    No signs of pancreatitis.  Appears to be tolerating GLP-1 and wants to continue. Aware of risk and signs of pancreatitis.       Morbid obesity (HCC)    Continue working with [facility].          I have discontinued Korah A. Fowers's lisinopril. I am also having her start on valsartan. Additionally, I am having her maintain her triamcinolone cream, hydrocortisone-pramoxine, escitalopram, Skyrizi, busPIRone, pregabalin, glipiZIDE, metFORMIN, Vtama, Urea, tamsulosin, ondansetron, oxyCODONE-acetaminophen, Basaglar KwikPen, Pen Needles, Trulicity, Vitamin D (Ergocalciferol), and cephALEXin.  Meds ordered this encounter  Medications   valsartan (DIOVAN) 160 MG tablet    Sig: Take 1 tablet (160 mg total) by mouth daily.    Dispense:  90 tablet    Refill:  1    Order Specific Question:   Supervising Provider    Answer:   Hillard Danker A [[id number]]

## 2022-09-29 DIAGNOSIS — N200 Calculus of kidney: Secondary | ICD-10-CM | POA: Insufficient documentation

## 2022-09-29 NOTE — Assessment & Plan Note (Signed)
Recommend she continue oral B12

## 2022-09-29 NOTE — Assessment & Plan Note (Signed)
No signs of pancreatitis.  Appears to be tolerating GLP-1 and wants to continue. Aware of risk and signs of pancreatitis.

## 2022-09-29 NOTE — Assessment & Plan Note (Signed)
Continue working with [facility].

## 2022-09-29 NOTE — Assessment & Plan Note (Signed)
Awaiting sleep study.

## 2022-09-29 NOTE — Assessment & Plan Note (Signed)
She is now under the care of a psychiatrist through [facility].  She also has a therapist.  Her mood is good. 

## 2022-09-29 NOTE — Assessment & Plan Note (Signed)
She will call and schedule appt with urologist.

## 2022-09-29 NOTE — Assessment & Plan Note (Signed)
BP not well controlled. Stop lisinopril and start valsartan. Work on healthy, lower sodium diet. She has a BP machine and may bring it in for a nurse visit so we can help her learn how to use it.  Reviewed recent labs from hospital visit.  Continue follow up with specialists. Follow up here in 3 months or sooner if HTN does not improve.

## 2022-09-30 ENCOUNTER — Ambulatory Visit (INDEPENDENT_AMBULATORY_CARE_PROVIDER_SITE_OTHER): Payer: Commercial Managed Care - HMO | Admitting: Physician Assistant

## 2022-09-30 ENCOUNTER — Encounter (INDEPENDENT_AMBULATORY_CARE_PROVIDER_SITE_OTHER): Payer: Self-pay | Admitting: Physician Assistant

## 2022-09-30 ENCOUNTER — Other Ambulatory Visit (HOSPITAL_BASED_OUTPATIENT_CLINIC_OR_DEPARTMENT_OTHER): Payer: Self-pay

## 2022-09-30 VITALS — BP 170/105 | HR 77 | Temp 98.1°F | Ht 65.0 in | Wt 295.0 lb

## 2022-09-30 DIAGNOSIS — I10 Essential (primary) hypertension: Secondary | ICD-10-CM | POA: Diagnosis not present

## 2022-09-30 DIAGNOSIS — Z794 Long term (current) use of insulin: Secondary | ICD-10-CM

## 2022-09-30 DIAGNOSIS — E559 Vitamin D deficiency, unspecified: Secondary | ICD-10-CM | POA: Diagnosis not present

## 2022-09-30 DIAGNOSIS — Z7985 Long-term (current) use of injectable non-insulin antidiabetic drugs: Secondary | ICD-10-CM

## 2022-09-30 DIAGNOSIS — Z7984 Long term (current) use of oral hypoglycemic drugs: Secondary | ICD-10-CM

## 2022-09-30 DIAGNOSIS — Z6841 Body Mass Index (BMI) 40.0 and over, adult: Secondary | ICD-10-CM

## 2022-09-30 DIAGNOSIS — E1169 Type 2 diabetes mellitus with other specified complication: Secondary | ICD-10-CM | POA: Diagnosis not present

## 2022-09-30 DIAGNOSIS — G4730 Sleep apnea, unspecified: Secondary | ICD-10-CM

## 2022-09-30 MED ORDER — VITAMIN D (ERGOCALCIFEROL) 1.25 MG (50000 UNIT) PO CAPS
ORAL_CAPSULE | ORAL | 0 refills | Status: DC
Start: 2022-09-30 — End: 2022-10-25
  Filled 2022-09-30: qty 8, 28d supply, fill #0

## 2022-09-30 MED ORDER — TRULICITY 1.5 MG/0.5ML ~~LOC~~ SOAJ
1.5000 mg | SUBCUTANEOUS | 0 refills | Status: DC
Start: 2022-09-30 — End: 2022-10-25
  Filled 2022-09-30: qty 2, 28d supply, fill #0

## 2022-09-30 NOTE — Progress Notes (Unsigned)
.smr  Office: [phone]  /  Fax: [phone]  WEIGHT SUMMARY AND BIOMETRICS  Vitals Temp: 98.1 F (36.7C) BP: 170/105  Pulse Rate: 77 SpO2: 99 %     Anthropometric Measurements Height: 5\' 5"  (1.651 m) Weight: 295 lb (133.8 kg) BMI (Calculated): 49.09 Weight at Last Visit: 297 lb Weight Lost Since Last Visit: 3 lb Weight Gained Since Last Visit: 0 Starting Weight: 299 lb Total Weight Loss (lbs): 4 lb (1.814 kg)     Body Composition  Body Fat %: 38.2 % Fat Mass (lbs): 112.8 lbs Muscle Mass (lbs): 173.4 lbs Total Body Water (lbs): 100.6 lbs Visceral Fat Rating : 13     Other Clinical Data Fasting: no Labs: No Today's Visit #: 9 Starting Date: 05/19/22  HPI  Chief Complaint: OBESITY  April Gonzales is here to discuss her progress with her obesity treatment plan. She is on the the Category 4 Plan and states she is following her eating plan approximately 75 % of the time. She states she is exercising 0 minutes 0 times per week.   Interval History:  Since last office visit she down 3 lbs.  She has been dealing with a kidney stone. Had cystoscopy, laser treatment and stent placement, but the stent dislodged quickly after the procedure. She continues to have pain related to the kidney stone. She has not yet followed up with Urology and is working on getting earlier follow up .   Hunger/appetite-Appetite has been on and off due to pain issues. She has been using a lot of microwave meals when not feeling well and trying to make better choices for frozen foods.  We discussed the importance of regular meals and adequate protein intake for weight loss and overall health.  Cravings- more cravings for chocolate with recent illness. Discussed eating yasso Stress- increased with recent surgery, ongoing medical issues and pain  Sleep- disrupted lately due to kidney stones/pain Exercise-very limited, She is hopeful to start going to the pool to exercise soon Hydration-increased water  intake and continues to work towards goal of at least    Pharmacotherapy: Trulicity 1.5 mg weekly. Denies mass in neck, dysphagia, dyspepsia, persistent hoarseness, abdominal pain, or N/V/Constipation or diarrhea. Has annual eye exam. Mood is stable.  Hx of pancreatitis in past x 2, but tolerating Trulicity well.   TREATMENT PLAN FOR OBESITY:  Recommended Dietary Goals  April Gonzales is currently in the action stage of change. As such, her goal is to continue weight management plan. She has agreed to the Category 4 Plan.  Behavioral Intervention  We discussed the following Behavioral Modification Strategies today: increasing lean protein intake, decreasing simple carbohydrates , increasing vegetables, increasing lower glycemic fruits, increasing fiber rich foods, avoiding skipping meals, increasing water intake, keeping healthy foods at home, continue to practice mindfulness when eating, planning for success, and better snacking choices.  Additional resources provided today: NA  Recommended Physical Activity Goals  April Gonzales has been advised to work up to 150 minutes of moderate intensity aerobic activity a week and strengthening exercises 2-3 times per week for cardiovascular health, weight loss maintenance and preservation of muscle mass.   She has agreed to Continue current level of physical activity  and Think about ways to increase daily physical activity and overcoming barriers to exercise   Pharmacotherapy We discussed various medication options to help April Gonzales with her weight loss efforts and we both agreed to continue Trulicity 1.5 mg weekly for Type 2 diabetes management along with other medications for diabetes management.  Return in about 3 weeks (around 10/21/2022).[name] She was informed of the importance of frequent follow up visits to maximize her success with intensive lifestyle modifications for her multiple health conditions.  PHYSICAL EXAM:  Blood pressure (!) 170/105, pulse 77,  temperature 98.1 F (36.7 C), height 5\' 5"  (1.651 m), weight 295 lb (133.8 kg), last menstrual period 08/27/2022, SpO2 99 %. Body mass index is 49.09 kg/m.  General: She is overweight, cooperative, alert, well developed, and in no acute distress. PSYCH: Has normal mood, affect and thought process.   Cardiovascular: HR 70's BP elevated 170/105 but patient reports in pain from kidney stones today.  Lungs: Normal breathing effort, no conversational dyspnea.  DIAGNOSTIC DATA REVIEWED:  BMET    Component Value Date/Time   NA 134 (L) 09/22/2022 0505   K 4.2 09/22/2022 0505   CL 98 09/22/2022 0505   CO2 21 (L) 09/22/2022 0505   GLUCOSE 358 (H) 09/22/2022 0505   BUN 17 09/22/2022 0505   CREATININE 1.01 (H) 09/22/2022 0505   CALCIUM 9.1 09/22/2022 0505   GFRNONAA >60 09/22/2022 0505   GFRAA >60 04/06/2019 1242   Lab Results  Component Value Date   HGBA1C 10.8 (H) 09/08/2022   HGBA1C 6.6 (H) 02/21/2016   Lab Results  Component Value Date   INSULIN 79.5 (H) 05/19/2022   Lab Results  Component Value Date   TSH 1.94 05/27/2022   CBC    Component Value Date/Time   WBC 12.7 (H) 09/22/2022 0505   RBC 5.22 (H) 09/22/2022 0505   HGB 13.4 09/22/2022 0505   HCT 40.6 09/22/2022 0505   PLT 334 09/22/2022 0505   MCV 77.8 (L) 09/22/2022 0505   MCH 25.7 (L) 09/22/2022 0505   MCHC 33.0 09/22/2022 0505   RDW 14.6 09/22/2022 0505   Iron Studies No results found for: "IRON", "TIBC", "FERRITIN", "IRONPCTSAT" Lipid Panel     Component Value Date/Time   CHOL 105 12/24/2021 1052   TRIG 119.0 12/24/2021 1052   HDL 38.60 (L) 12/24/2021 1052   CHOLHDL 3 12/24/2021 1052   VLDL 23.8 12/24/2021 1052   LDLCALC 42 12/24/2021 1052   Hepatic Function Panel     Component Value Date/Time   PROT 7.9 08/24/2022 0354   ALBUMIN 3.7 08/24/2022 0354   AST 20 08/24/2022 0354   ALT 27 08/24/2022 0354   ALKPHOS 89 08/24/2022 0354   BILITOT 0.6 08/24/2022 0354      Component Value Date/Time    TSH 1.94 05/27/2022 1524   Nutritional Lab Results  Component Value Date   VD25OH 4.7 (L) 05/19/2022    ASSOCIATED CONDITIONS ADDRESSED TODAY  ASSESSMENT AND PLAN  Problem List Items Addressed This Visit     Morbid obesity (HCC)   Relevant Medications   Dulaglutide (TRULICITY) 1.5 MG/0.5ML SOPN   Type 2 diabetes mellitus with other specified complication (HCC) - Primary   Relevant Medications   Dulaglutide (TRULICITY) 1.5 MG/0.5ML SOPN   Essential hypertension   Vitamin D deficiency   Relevant Medications   Vitamin D, Ergocalciferol, (DRISDOL) 1.25 MG (50000 UNIT) CAPS capsule   BMI 45.0-49.9, adult (HCC)   Relevant Medications   Dulaglutide (TRULICITY) 1.5 MG/0.5ML SOPN  Type 2 Diabetes Mellitus with other specified complication, without long-term current use of insulin She is currently on Insulin Glargine with improvement in A1C HgbA1c is not at goal. Last A1c was 10.8- but improving CBGs: Fasting 150-180's Episodes of hypoglycemia: no Medication(s): Trulicity 1.5 mg SQ weekly and metformin 1000 mg twice daily,  Glipizide 5  mg daily Denies mass in neck, dysphagia, dyspepsia, persistent hoarseness, or N/V/Constipation or diarrhea. Has annual eye exam. Mood is stable. She is having some back/abdominal pain related to her kidney stones. She denies any pain similar to what she had when she had pancreatitis previously.    Lab Results  Component Value Date   HGBA1C 10.8 (H) 09/08/2022   HGBA1C 12.3 (H) 05/27/2022   HGBA1C 13.1 (H) 02/26/2022   Lab Results  Component Value Date   MICROALBUR 7.9 (H) 02/26/2022   LDLCALC 42 12/24/2021   CREATININE 1.01 (H) 09/22/2022   Lab Results  Component Value Date   GFR 103.31 05/27/2022   GFR 104.54 03/26/2022   GFR 101.39 02/26/2022    Plan: Continue and refill Trulicity 1.5 mg SQ weekly and continue metformin 1000 mg twice daily, Glipizide 5  mg daily and usual insulin glargine 30 units at bedtime per Endocrinology. Following  closely with Endocrinology and has follow up with dietitian as well.  Continue working on nutrition plan to decrease simple carbohydrates, increase lean proteins and exercise to promote weight loss and improve glycemic control .  Vitamin D Deficiency Vitamin D is not at goal of 50.  Most recent vitamin D level was 4.7. She is on  prescription ergocalciferol 50,000 IU twice weekly. Lab Results  Component Value Date   VD25OH 4.7 (L) 05/19/2022    Plan: Continue and refill  prescription ergocalciferol 50,000 IU twice weekly Low vitamin D levels can be associated with adiposity and may result in leptin resistance and weight gain. Also associated with fatigue. Currently on vitamin D supplementation without any adverse effects.  Recheck vitamin D level over the next 1-2 months.   Hypertension Hypertension asymptomatic, no significant medication side effects noted, control uncertain, poorly controlled, loss of control due to intercurrent illness, and needs further observation.  Medication(s): valsartan 160 mg daily.   BP Readings from Last 3 Encounters:  09/30/22 (!) 170/105  09/28/22 (!) 148/94  09/22/22 (!) 201/98   Lab Results  Component Value Date   CREATININE 1.01 (H) 09/22/2022   CREATININE 0.88 09/21/2022   CREATININE 1.05 (H) 08/24/2022   Lab Results  Component Value Date   GFR 103.31 05/27/2022   GFR 104.54 03/26/2022   GFR 101.39 02/26/2022    Plan: Continue all antihypertensives at current dosages. Continue to work on nutrition plan to promote weight loss and improve BP control.  Current elevations in BP likely related in pain associated with kidney stones, but will monitor closely, add medications if not improving with resolution of current issues.     ATTESTASTION STATEMENTS:  Reviewed by clinician on day of visit: allergies, medications, problem list, medical history, surgical history, family history, social history, and previous encounter notes.   I have  personally spent 44 minutes total time today in preparation, patient care, nutritional counseling and documentation for this visit, including the following: review of clinical lab tests; review of medical tests/procedures/services.      April Dirocco, PA-C

## 2022-10-05 ENCOUNTER — Ambulatory Visit: Payer: Commercial Managed Care - HMO | Admitting: Neurology

## 2022-10-05 DIAGNOSIS — F5105 Insomnia due to other mental disorder: Secondary | ICD-10-CM

## 2022-10-05 DIAGNOSIS — G8929 Other chronic pain: Secondary | ICD-10-CM

## 2022-10-05 DIAGNOSIS — E1165 Type 2 diabetes mellitus with hyperglycemia: Secondary | ICD-10-CM

## 2022-10-05 DIAGNOSIS — L409 Psoriasis, unspecified: Secondary | ICD-10-CM

## 2022-10-05 DIAGNOSIS — E662 Morbid (severe) obesity with alveolar hypoventilation: Secondary | ICD-10-CM

## 2022-10-05 DIAGNOSIS — Z9189 Other specified personal risk factors, not elsewhere classified: Secondary | ICD-10-CM

## 2022-10-05 DIAGNOSIS — G4733 Obstructive sleep apnea (adult) (pediatric): Secondary | ICD-10-CM | POA: Diagnosis not present

## 2022-10-07 ENCOUNTER — Encounter: Payer: Commercial Managed Care - HMO | Attending: Family Medicine | Admitting: Nutrition

## 2022-10-07 ENCOUNTER — Other Ambulatory Visit: Payer: Self-pay | Admitting: Nurse Practitioner

## 2022-10-07 ENCOUNTER — Encounter: Payer: Self-pay | Admitting: Nutrition

## 2022-10-07 VITALS — Ht 65.0 in | Wt 302.0 lb

## 2022-10-07 DIAGNOSIS — E119 Type 2 diabetes mellitus without complications: Secondary | ICD-10-CM | POA: Insufficient documentation

## 2022-10-07 DIAGNOSIS — Z6841 Body Mass Index (BMI) 40.0 and over, adult: Secondary | ICD-10-CM | POA: Diagnosis present

## 2022-10-07 DIAGNOSIS — I10 Essential (primary) hypertension: Secondary | ICD-10-CM | POA: Diagnosis present

## 2022-10-07 DIAGNOSIS — E538 Deficiency of other specified B group vitamins: Secondary | ICD-10-CM | POA: Diagnosis present

## 2022-10-07 MED ORDER — DEXCOM G7 RECEIVER DEVI: 1.0000 | Freq: Once | 0 refills | Status: AC

## 2022-10-07 MED ORDER — DEXCOM G7 SENSOR MISC: 1.0000 | 3 refills | Status: DC

## 2022-10-07 NOTE — Patient Instructions (Signed)
Goals  Cut out sodas and replace with water Eat eggs, multigrain cherrios with almond milk Increase whole plant based foods Eat meals on time.  Keep  a food journal Test blood sugars 3 times per day Start going to pool 3 times per week for exercise 1hr a time Get A1C down to 7% Lose 1-2 lb per week.

## 2022-10-07 NOTE — Progress Notes (Signed)
Medical Nutrition Therapy  Appointment Start time:  0800  Appointment End time:  0900  Primary concerns today: Type 2 DM and MOrbid obesity  Referral diagnosis: E11.8, [id number]. Preferred learning style: Visuals and hands on.) Learning readiness: Ready    NUTRITION ASSESSMENT  47 yr old wfemale referred for obesity and uncontrolled Type 2 DM.  Sees Ronny Bacon, FNP at [facility]. Also sees [facility] in [place]. PMH Type 2 DM, Morbid Obesity, HTN, Depression, Anxiety, Vitamin D Deficiency. Currenlty on 30 units of Lantus, Trulicity, Glipizide, Metformin 1000 mg BID. FBS 150-180's. A1C 10.8%.  She sees a therapist for her depression and anxiety. Has been working behavior changes. Diet is still high in processed foods, sodas. She notes she can't stand a long time due to her back and kidney stone issues. Wants to do water therapy for exercises for weight loss.  Has hyperinsulinemia with Insulin levels of 79.5. Vit D very low at 4.7. She is taking Vit D 50,000 units twice a week due to Vit D levels so low.  No cortisol levels for review.  Will be getting a CGM Dexcom ordered from Ronny Bacon, to use to better manage her BS control and comorbidities.   Anthropometrics  Wt Readings from Last 3 Encounters:  09/30/22 295 lb (133.8 kg)  09/28/22 297 lb (134.7 kg)  09/22/22 298 lb (135.2 kg)   Ht Readings from Last 3 Encounters:  09/30/22 5\' 5"  (1.651 m)  09/28/22 5\' 5"  (1.651 m)  09/22/22 5\' 5"  (1.651 m)   There is no height or weight on file to calculate BMI. @BMIFA @ Facility age limit for growth %iles is 20 years. Facility age limit for growth %iles is 20 years.    Clinical Medical Hx: See chart Medications: Lantus, GLipizide, Trulicity, Metformin Labs:  Lab Results  Component Value Date   HGBA1C 10.8 (H) 09/08/2022      Latest Ref Rng & Units 09/22/2022    5:05 AM 09/21/2022    2:39 PM 08/24/2022    3:54 AM  CMP  Glucose 70 - 99 mg/dL 161  096  045   BUN 6 -  20 mg/dL 17  13  17    Creatinine 0.44 - 1.00 mg/dL 4.09  8.11  9.14   Sodium 135 - 145 mmol/L 134  137  133   Potassium 3.5 - 5.1 mmol/L 4.2  3.9  4.1   Chloride 98 - 111 mmol/L 98  102  99   CO2 22 - 32 mmol/L 21  24  21    Calcium 8.9 - 10.3 mg/dL 9.1  8.8  9.0   Total Protein 6.5 - 8.1 g/dL   7.9   Total Bilirubin 0.3 - 1.2 mg/dL   0.6   Alkaline Phos 38 - 126 U/L   89   AST 15 - 41 U/L   20   ALT 0 - 44 U/L   27    Lipid Panel     Component Value Date/Time   CHOL 105 12/24/2021 1052   TRIG 119.0 12/24/2021 1052   HDL 38.60 (L) 12/24/2021 1052   CHOLHDL 3 12/24/2021 1052   VLDL 23.8 12/24/2021 1052   LDLCALC 42 12/24/2021 1052    Notable Signs/Symptoms: Increased thirsty, frequent urination, sleelpy, fatigue, blurry vision  Lifestyle & Dietary Hx LIves by herself.  Estimated daily fluid intake: 40 oz--drinking more water now.. still drinking sodas Supplements: Vit D Sleep: poor-- stays up at night and sleep in during the day Stress /  self-care: yes Current average weekly physical activity: ADL   24-Hr Dietary Recall Eats 2-3 meals per day. Eats out due to lack of ability to stand and cook for long periods of time. Recently started eating more frozen microwavable foods and vegetables. Drinking more water and trying to cut back on sodas.  Estimated Energy Needs Calories: 1200 Carbohydrate: 135g Protein: 90g Fat: 33g   NUTRITION DIAGNOSIS  NI-1.7 Predicted excessive energy intake As related to high calorie diet.  As evidenced by DM TYpe 2 A1C 10.8%, BMI 50 .   NUTRITION INTERVENTION  Nutrition education (E-1) on the following topics:  Nutrition and Diabetes education provided on My Plate, CHO counting, meal planning, portion sizes, timing of meals, avoiding snacks between meals unless having a low blood sugar, target ranges for A1C and blood sugars, signs/symptoms and treatment of hyper/hypoglycemia, monitoring blood sugars, taking medications as prescribed,  benefits of exercising 30 minutes per day and prevention of complications of DM.  Lifestyle Medicine  - Whole Food, Plant Predominant Nutrition is highly recommended: Eat Plenty of vegetables, Mushrooms, fruits, Legumes, Whole Grains, Nuts, seeds in lieu of processed meats, processed snacks/pastries red meat, poultry, eggs.    -It is better to avoid simple carbohydrates including: Cakes, Sweet Desserts, Ice Cream, Soda (diet and regular), Sweet Tea, Candies, Chips, Cookies, Store Bought Juices, Alcohol in Excess of  1-2 drinks a day, Lemonade,  Artificial Sweeteners, Doughnuts, Coffee Creamers, "Sugar-free" Products, etc, etc.  This is not a complete list.....  Exercise: If you are able: 30 -60 minutes a day ,4 days a week, or 150 minutes a week.  The longer the better.  Combine stretch, strength, and aerobic activities.  If you were told in the past that you have high risk for cardiovascular diseases, you may seek evaluation by your heart doctor prior to initiating moderate to intense exercise programs.   Handouts Provided Include  Lifestyle Handouts Know your numbers LIfestyle grocery list  Learning Style & Readiness for Change Teaching method utilized: Visual & Auditory  Demonstrated degree of understanding via: Teach Back  Barriers to learning/adherence to lifestyle change: none  Goals Established by Pt Goals  Cut out sodas and replace with water Eat eggs, multigrain cherrios with almond milk Increase whole plant based foods Eat meals on time.  Keep  a food journal Test blood sugars 3 times per day Start going to pool 3 times per week for exercise 1hr a time Get A1C down to 7% Lose 1-2 lb per week.   MONITORING & EVALUATION Dietary intake, weekly physical activity, and weight and blood sugars in 1 month.  Next Steps  Patient is to  work on eating better balanced meals with whole plant based foods and cutting out sodas.[name]

## 2022-10-11 NOTE — Progress Notes (Signed)
[facility]   HOME SLEEP TEST REPORT ( by Watch PAT)   STUDY DATE:  10-11-2022  April Gonzales 47 year old female 04-18-1976   ORDERING CLINICIAN: Melvyn Novas, MD  REFERRING CLINICIAN: Seymour Bars, DO/    CLINICAL INFORMATION/HISTORY:  08-16-2022: referral for chronic insomnia. NEW SLEEP CONSULT - reports Chronic insomnia-reports trouble falling asleep and staying asleep, being in pain- daytime fatigue mind is racing- classic anxiety insomnia. Uses her smart phone while in bed.  She has morning headaches about 1-2 times a week  pain is in the forehead, bilateral.  If she drinks caffeine,  the headache will go away. Symptoms have gradually worsened over the last year.  Uncontrolled diabetes. Nocturia every 2 hours, back problems. The preferred sleep position is left sided, with the support of 2-4 pillows.  Dreams are reportedly frequent/vivid. Sometimes having nightmares. Morbid Obesity BMI 50.      Epworth sleepiness score: 13/24.  FSS at    BMI: 50.7kg/m   Neck Circumference: 19"     Sleep Summary:   Total Recording Time (hours, min):   7 hours 33 minutes  Total Sleep Time (hours, min):   6 hours and 50 minutes              Percent REM (%):    30%                                    Respiratory Indices:   Calculated pAHI (per hour):    9.6/h                         REM pAHI:   22/h                                              NREM pAHI:    4.2/h                          Positional AHI:   In supine position, the AHI was 14.1/h and in right lateral position 6.1/h.  These were the only 2 sleep positions seen.  Please note that the patient sleeps elevated on several pillows.  Snoring reached mean volume of 40 dB, 3.1% sleep time with snoring.                                                Oxygen Saturation Statistics:    O2 Saturation Range (%): Between a nadir of 89 and a maximum of 99% with a mean saturation at 96%.                                      O2 Saturation (minutes) <89%:   0 minutes       Pulse Rate Statistics:   Pulse Mean (bpm): 75 bpm               Pulse Range:   Between 62 and 101 bpm.  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   REM sleep dependent apnea tends to be a form of hypoventilation-  and is seen in patients with elevated body mass index especially abdominal girth, but also in patients with weak core muscle strength, diaphragmatic weakness, neuromuscular diseases.  Weight loss is certainly helpful to reduce a REM sleep dependent apnea. PAP may reduce the morning headachs as well.    RECOMMENDATION: Positive airway pressure is usually the only way to help assist with deeper air ventilation.  Neither a dental device nor a hypoglossal nerve stimulator could achieve  improvement in deep air movement.   I strongly recommend positive airway pressure therapy if this is poorly tolerated we should switch to BiPAP in a patient with this BMI.  Start physical therapy between 5 and 20 cmH2O pressure was 2 cm EPR, heated humidifier. A mask will be provided and fitted to the patient's needs.  Every visit will take place between day 30 and 80 of CPAP use.  This can be through a nurse practitioner in our office or through the MD.   INTERPRETING PHYSICIAN:   Melvyn Novas, MD

## 2022-10-23 NOTE — Procedures (Signed)
[facility]   HOME SLEEP TEST REPORT ( by Watch PAT)   STUDY DATE:  10-11-2022  April Gonzales 47 year old female 10-10-1975   ORDERING CLINICIAN: Melvyn Novas, MD  REFERRING CLINICIAN: Seymour Bars, DO/    CLINICAL INFORMATION/HISTORY:  08-16-2022: referral for chronic insomnia. NEW SLEEP CONSULT - reports Chronic insomnia-reports trouble falling asleep and staying asleep, being in pain- daytime fatigue mind is racing- classic anxiety insomnia. Uses her smart phone while in bed.  She has morning headaches about 1-2 times a week  pain is in the forehead, bilateral.  If she drinks caffeine,  the headache will go away. Symptoms have gradually worsened over the last year.  Uncontrolled diabetes. Nocturia every 2 hours, back problems. The preferred sleep position is left sided, with the support of 2-4 pillows.  Dreams are reportedly frequent/vivid. Sometimes having nightmares. Morbid Obesity BMI 50.      Epworth sleepiness score: 13/24.  FSS at    BMI: 50.7kg/m   Neck Circumference: 19"     Sleep Summary:   Total Recording Time (hours, min):   7 hours 33 minutes  Total Sleep Time (hours, min):   6 hours and 50 minutes              Percent REM (%):    30%                                    Respiratory Indices:   Calculated pAHI (per hour):    9.6/h                         REM pAHI:   22/h                                              NREM pAHI:    4.2/h                          Positional AHI:   In supine position, the AHI was 14.1/h and in right lateral position 6.1/h.  These were the only 2 sleep positions seen.  Please note that the patient sleeps elevated on several pillows.  Snoring reached mean volume of 40 dB, 3.1% sleep time with snoring.                                                Oxygen Saturation Statistics:    O2 Saturation Range (%): Between a nadir of 89 and a maximum of 99% with a mean saturation at 96%.                                      O2 Saturation (minutes) <89%:   0 minutes       Pulse Rate Statistics:   Pulse Mean (bpm): 75 bpm               Pulse Range:   Between 62 and 101 bpm.  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   REM sleep dependent apnea tends to be a form of hypoventilation-  and is seen in patients with elevated body mass index especially abdominal girth, but also in patients with weak core muscle strength, diaphragmatic weakness, neuromuscular diseases.  Weight loss is certainly helpful to reduce a REM sleep dependent apnea. PAP may reduce the morning headachs as well.    RECOMMENDATION: Positive airway pressure is usually the only way to help assist with deeper air ventilation.  Neither a dental device nor a hypoglossal nerve stimulator could achieve  improvement in deep air movement. PAP therapy is not expected to improve  psychogenic sleep onset insomnia or chronic pain related insomnia.    I strongly recommend positive airway pressure therapy if this is poorly tolerated we should switch to BiPAP in a patient with this BMI.  Start physical therapy between 5 and 20 cmH2O pressure was 2 cm EPR, heated humidifier. A mask will be provided and fitted to the patient's needs.  Every visit will take place between day 30 and 80 of CPAP use.  This can be through a nurse practitioner in our office or through the MD.   INTERPRETING PHYSICIAN:   Melvyn Novas, MD

## 2022-10-25 ENCOUNTER — Encounter (INDEPENDENT_AMBULATORY_CARE_PROVIDER_SITE_OTHER): Payer: Self-pay | Admitting: Physician Assistant

## 2022-10-25 ENCOUNTER — Ambulatory Visit (INDEPENDENT_AMBULATORY_CARE_PROVIDER_SITE_OTHER): Payer: Commercial Managed Care - HMO | Admitting: Physician Assistant

## 2022-10-25 VITALS — BP 167/91 | HR 88 | Temp 98.2°F | Ht 65.0 in | Wt 298.0 lb

## 2022-10-25 DIAGNOSIS — M4800 Spinal stenosis, site unspecified: Secondary | ICD-10-CM | POA: Insufficient documentation

## 2022-10-25 DIAGNOSIS — I10 Essential (primary) hypertension: Secondary | ICD-10-CM

## 2022-10-25 DIAGNOSIS — Z7985 Long-term (current) use of injectable non-insulin antidiabetic drugs: Secondary | ICD-10-CM

## 2022-10-25 DIAGNOSIS — E1169 Type 2 diabetes mellitus with other specified complication: Secondary | ICD-10-CM

## 2022-10-25 DIAGNOSIS — M5442 Lumbago with sciatica, left side: Secondary | ICD-10-CM

## 2022-10-25 DIAGNOSIS — N200 Calculus of kidney: Secondary | ICD-10-CM

## 2022-10-25 DIAGNOSIS — G8929 Other chronic pain: Secondary | ICD-10-CM

## 2022-10-25 DIAGNOSIS — E538 Deficiency of other specified B group vitamins: Secondary | ICD-10-CM

## 2022-10-25 DIAGNOSIS — E559 Vitamin D deficiency, unspecified: Secondary | ICD-10-CM

## 2022-10-25 DIAGNOSIS — Z794 Long term (current) use of insulin: Secondary | ICD-10-CM

## 2022-10-25 DIAGNOSIS — Z7984 Long term (current) use of oral hypoglycemic drugs: Secondary | ICD-10-CM

## 2022-10-25 DIAGNOSIS — Z6841 Body Mass Index (BMI) 40.0 and over, adult: Secondary | ICD-10-CM

## 2022-10-25 MED ORDER — VITAMIN D (ERGOCALCIFEROL) 1.25 MG (50000 UNIT) PO CAPS
ORAL_CAPSULE | ORAL | 0 refills | Status: DC
Start: 2022-10-25 — End: 2022-11-22

## 2022-10-25 MED ORDER — TRULICITY 1.5 MG/0.5ML ~~LOC~~ SOAJ
1.5000 mg | SUBCUTANEOUS | 0 refills | Status: DC
Start: 2022-10-25 — End: 2022-11-22

## 2022-10-25 NOTE — Progress Notes (Signed)
.smr  Office: [phone]  /  Fax: [phone]  WEIGHT SUMMARY AND BIOMETRICS  Vitals Temp: 98.2 F (36.8 C) BP: (!) 167/91 Pulse Rate: 88 SpO2: 98 %   Anthropometric Measurements Height: 5\' 5"  (1.651 m) Weight: 298 lb (135.2 kg) BMI (Calculated): 49.59 Weight at Last Visit: 295 lb Weight Lost Since Last Visit: 0 Weight Gained Since Last Visit: 3 lb Starting Weight: 299 lb Total Weight Loss (lbs): 1 lb (0.454 kg) Peak Weight: 370 lb   Body Composition  Body Fat %: 48.4 % Fat Mass (lbs): 144.6 lbs Muscle Mass (lbs): 146.2 lbs Total Body Water (lbs): 100.8 lbs Visceral Fat Rating : 17   Other Clinical Data Fasting: yes Labs: no Today's Visit #: 10 Starting Date: 05/19/22     HPI  Chief Complaint: OBESITY  April Gonzales is here to discuss her progress with her obesity treatment plan. She is on the the Category 4 Plan and states she is following her eating plan approximately 80 % of the time. She states she is exercising chair exercises 30 minutes 7 times per week.  Discussed the use of AI scribe software for clinical note transcription with the patient, who gave verbal consent to proceed.  History of Present Illness        April Gonzales, a patient with a history of obesity, type 2 diabetes, vitamin D deficiency, severe low back pain, and recent kidney stones, presents for a follow-up visit. The patient's diabetes is primarily managed by endocrinology with glipizide, metformin, insulin glargine, and Trulicity. The most recent hemoglobin A1c was improved at 10.8 from a previous level of 13.8. The patient recently struggled with kidney stones and had a stent placed, which came out within 24 hours of placement. The patient also has a history of vitamin D deficiency, with a level of 4.7 in January of this year, and has not had a follow-up level since. The patient has severe low back pain and requires back surgery, but has been unable to proceed due to an elevated BMI.  The  patient reports that the pain from the kidney stones has resolved and believes the stones have passed. She continues to experience back pain and leg pain. The patient has been sleeping better since the resolution of the kidney stone pain. The patient has received a Dexcom from her endocrinologist but is unsure how to use it and plans to schedule a nurse visit for instruction. The patient's fasting blood sugar has been around 150, which is an improvement from previous levels around 200. The patient reports variable hunger and appetite, sometimes feeling less hungry due to Trulicity and eating smaller meals. However, she also reports experiencing cravings in the evening, during which she typically eats popcorn.  Interval History:  Since last office visit she is up 3 lbs.  Hunger/appetite-reports decreased appetite at times, but overall not excessive Cravings- in evenings- Eating Smart Pop popcorn- Discussed and provided hand out for 100 calorie protein snacks Stress- Getting ready to move in with her Dad in Sept. Has not lived with him for many years and is a little concerned about how things will go, but it will help a lot with financial stressors Sleep- Better now that kidney stones have resolved, but still not consistently restorative as still significant back pain issues.  Had home sleep study done, results are pending.   Exercise-Doing chair exercises daily. Encouraged her to try and get out to pool.  Hydration-Drinking at least 1 gallon daily.  Protein - feels adequate intake.  Pharmacotherapy: Trulicity 1.5 mg weekly. Denies mass in neck, dysphagia, dyspepsia, persistent hoarseness, abdominal pain, or N/V/Constipation or diarrhea. Has annual eye exam. Mood is stable.  Hx of pancreatitis in past x 2, but tolerating Trulicity well.   TREATMENT PLAN FOR OBESITY: Obesity: Hindering ability to have necessary back surgery. Evening cravings managed with popcorn. Limited physical activity due to  chronic back pain  -Consider protein-based snacks in the evening. Provided 100 calorie protein snacks -Attempt to increase physical activity, such as pool walking when available. Recommended Dietary Goals  April Gonzales is currently in the action stage of change. As such, her goal is to continue weight management plan. She has agreed to the Category 4 Plan.  Behavioral Intervention  We discussed the following Behavioral Modification Strategies today: increasing lean protein intake, decreasing simple carbohydrates , increasing vegetables, increasing lower glycemic fruits, increasing fiber rich foods, avoiding skipping meals, increasing water intake, keeping healthy foods at home, work on managing stress, creating time for self-care and relaxation measures, continue to practice mindfulness when eating, planning for success, and better snacking choices.  Additional resources provided today: 100 calorie protein snacks  Recommended Physical Activity Goals  April Gonzales has been advised to work up to 150 minutes of moderate intensity aerobic activity a week and strengthening exercises 2-3 times per week for cardiovascular health, weight loss maintenance and preservation of muscle mass.   She has agreed to Continue current level of physical activity , Think about ways to increase daily physical activity and overcoming barriers to exercise, and think about starting pool walking at least 1-2 daily weekly   Pharmacotherapy We discussed various medication options to help April Gonzales with her weight loss efforts and we both agreed to continue Trulicity 1.5 mg weekly for Type 2 diabetes management along with other medications for diabetes management.    Return in about 4 weeks (around 11/22/2022).[name] She was informed of the importance of frequent follow up visits to maximize her success with intensive lifestyle modifications for her multiple health conditions.  PHYSICAL EXAM:  Blood pressure (!) 167/91, pulse 88, temperature  98.2 F (36.8 C), height 5\' 5"  (1.651 m), weight 298 lb (135.2 kg), SpO2 98 %. Body mass index is 49.59 kg/m.  General: She is centrally obese, cooperative, alert, well developed, and in no acute distress. Cushingoid facies.  Ambulates with rolling walker. PSYCH: Has normal mood, affect and thought process.   Cardiovascular: HR 80s, regular, blood pressure 174/83, recheck 167/91-patient reports she is in a fair amount of back pain today Lungs: Normal breathing effort, no conversational dyspnea.  DIAGNOSTIC DATA REVIEWED:  BMET    Component Value Date/Time   NA 134 (L) 09/22/2022 0505   K 4.2 09/22/2022 0505   CL 98 09/22/2022 0505   CO2 21 (L) 09/22/2022 0505   GLUCOSE 358 (H) 09/22/2022 0505   BUN 17 09/22/2022 0505   CREATININE 1.01 (H) 09/22/2022 0505   CALCIUM 9.1 09/22/2022 0505   GFRNONAA >60 09/22/2022 0505   GFRAA >60 04/06/2019 1242   Lab Results  Component Value Date   HGBA1C 10.8 (H) 09/08/2022   HGBA1C 6.6 (H) 02/21/2016   Lab Results  Component Value Date   INSULIN 79.5 (H) 05/19/2022   Lab Results  Component Value Date   TSH 1.94 05/27/2022   CBC    Component Value Date/Time   WBC 12.7 (H) 09/22/2022 0505   RBC 5.22 (H) 09/22/2022 0505   HGB 13.4 09/22/2022 0505   HCT 40.6 09/22/2022 0505   PLT 334  09/22/2022 0505   MCV 77.8 (L) 09/22/2022 0505   MCH 25.7 (L) 09/22/2022 0505   MCHC 33.0 09/22/2022 0505   RDW 14.6 09/22/2022 0505   Iron Studies No results found for: "IRON", "TIBC", "FERRITIN", "IRONPCTSAT" Lipid Panel     Component Value Date/Time   CHOL 105 12/24/2021 1052   TRIG 119.0 12/24/2021 1052   HDL 38.60 (L) 12/24/2021 1052   CHOLHDL 3 12/24/2021 1052   VLDL 23.8 12/24/2021 1052   LDLCALC 42 12/24/2021 1052   Hepatic Function Panel     Component Value Date/Time   PROT 7.9 08/24/2022 0354   ALBUMIN 3.7 08/24/2022 0354   AST 20 08/24/2022 0354   ALT 27 08/24/2022 0354   ALKPHOS 89 08/24/2022 0354   BILITOT 0.6 08/24/2022  0354      Component Value Date/Time   TSH 1.94 05/27/2022 1524   Nutritional Lab Results  Component Value Date   VD25OH 4.7 (L) 05/19/2022    ASSOCIATED CONDITIONS ADDRESSED TODAY  ASSESSMENT AND PLAN  Problem List Items Addressed This Visit     Morbid obesity (HCC)   Relevant Medications   Dulaglutide (TRULICITY) 1.5 MG/0.5ML SOPN   B12 deficiency   Relevant Orders   Vitamin B12   Type 2 diabetes mellitus with other specified complication (HCC) - Primary   Relevant Medications   Dulaglutide (TRULICITY) 1.5 MG/0.5ML SOPN   Essential hypertension   Vitamin D deficiency   Relevant Medications   Vitamin D, Ergocalciferol, (DRISDOL) 1.25 MG (50000 UNIT) CAPS capsule   Other Relevant Orders   VITAMIN D 25 Hydroxy (Vit-D Deficiency, Fractures)   BMI 45.0-49.9, adult (HCC)   Relevant Medications   Dulaglutide (TRULICITY) 1.5 MG/0.5ML SOPN   Renal stones   Chronic low back pain with left-sided sciatica  Type 2 Diabetes Mellitus with other specified complication, with long-term current use of insulin HgbA1c is not at goal. Last A1c was 10.8 CBGs: Fasting 150's Episodes of hypoglycemia: no On ARB Medication(s): Improved control with HbA1c of 10.8 from 13.8. On glipizide, metformin, insulin glargine, and Trulicity. Endocrinology has ordered the patient a Dexcom machine but she is not confident in use.  She is going to contact the endocrinology nurse to see if she can come in and have some instructions for use. She is working on a Engineer, technical sales and exercise as able to promote weight loss and improve glycemic control  Lab Results  Component Value Date   HGBA1C 10.8 (H) 09/08/2022   HGBA1C 12.3 (H) 05/27/2022   HGBA1C 13.1 (H) 02/26/2022   Lab Results  Component Value Date   MICROALBUR 7.9 (H) 02/26/2022   LDLCALC 42 12/24/2021   CREATININE 1.01 (H) 09/22/2022   Lab Results  Component Value Date   GFR 103.31 05/27/2022   GFR 104.54 03/26/2022   GFR 101.39 02/26/2022     Plan: Continue and refill Trulicity 1.5 mg SQ weekly Improved control with HbA1c of 10.8 from 13.8. On glipizide, metformin, insulin glargine, and Trulicity. Patient received Dexcom but unsure of how to use it. Fasting blood sugars around 150. -Continue current medications. -Contact endocrinology office for a nurse visit to learn how to use Dexcom. She is due to follow-up with endocrinology in August. -Check blood sugars three times a day once Dexcom is in use.  Hypertension Hypertension no significant medication side effects noted, poorly controlled, needs further observation, and needs improvement.  Medication(s): Valsartan 160 mg once daily Mild elevation of creatinine with normal GFR  BP Readings from Last 3  Encounters:  10/25/22 (!) 167/91  09/30/22 (!) 170/105  09/28/22 (!) 148/94   Lab Results  Component Value Date   CREATININE 1.01 (H) 09/22/2022   CREATININE 0.88 09/21/2022   CREATININE 1.05 (H) 08/24/2022   Lab Results  Component Value Date   GFR 103.31 05/27/2022   GFR 104.54 03/26/2022   GFR 101.39 02/26/2022    Plan: Continue all antihypertensives at current dosages.  Blood pressure control affected by pain per patient is under better control when not having as much pain control issues.  Will continue to monitor with PCP.  May need additional medication. Continue to work on nutrition plan to promote weight loss and improve BP control.    Vitamin D Deficiency Vitamin D is not at goal of 50.  Most recent vitamin D level was 4.7. She is on  prescription ergocalciferol 50,000 IU twice weekly. No N/V or muscle weakness or other side effects with Ergocalciferol.  Lab Results  Component Value Date   VD25OH 4.7 (L) 05/19/2022    Plan: Continue and refill  prescription ergocalciferol 50,000 IU twice weekly Last level was 4.7 in January. No recent follow-up level. -Check Vitamin D level today. Low vitamin D levels can be associated with adiposity and may  result in leptin resistance and weight gain. Also associated with fatigue. Currently on vitamin D supplementation without any adverse effects.    B12 Deficiency: Last level was 247. Patient taking over-the-counter B12. -Check B12 level today. -Continue current B12 supplementation.  Kidney Stones: Recent stent placement but stent came out within 24 hours. No current pain, patient believes stones have passed. Follow-up appointment with urology on the July 18th. -Continue follow-up with urology.  Low Back Pain: Severe, causing leg pain. Man[facility] by Dr[facility] Cre[facility] at [facility]. -Continue current management . Encouraged to try and do some pool walking to increase activity level.  ATTESTASTION STATEMENTS:  Reviewed by clinician on day of visit: allergies, medications, problem list, medical history, surgical history, family history, social history, and previous encounter notes.   I have personally spent 48 minutes total time today in preparation, patient care, nutritional counseling and documentation for this visit, including the following: review of clinical lab tests; review of medical tests/procedures/services.      Kristina Mcnorton, PA-C

## 2022-10-26 ENCOUNTER — Telehealth: Payer: Self-pay

## 2022-10-26 LAB — VITAMIN B12: Vitamin B-12: 290 pg/mL (ref 232–1245)

## 2022-10-26 LAB — VITAMIN D 25 HYDROXY (VIT D DEFICIENCY, FRACTURES): Vit D, 25-Hydroxy: 19.2 ng/mL — ABNORMAL LOW (ref 30.0–100.0)

## 2022-10-26 NOTE — Telephone Encounter (Signed)
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

## 2022-10-26 NOTE — Telephone Encounter (Signed)
I called pt. I advised pt that Dr. Vickey Huger reviewed their sleep study results and found that pt has sleep apnea. Dr. Vickey Huger recommends that pt start an auto PAP at home. I reviewed PAP compliance expectations with the pt. Pt is agreeable to starting an auto-PAP. I advised pt that an order will be sent to a DME, [facility], and [facility] will call the pt within about one week after they file with the pt's insurance. [facility] will show the pt how to use the machine, fit for masks, and troubleshoot the auto-PAP if needed. A follow up appt was made for insurance purposes with Shanda Bumps, NP on 01/26/23 at 3:45pm. Pt verbalized understanding to arrive 15 minutes early and bring their auto-PAP. Pt verbalized understanding of results. Pt had no questions at this time but was encouraged to call back if questions arise. I have sent the order to [facility] and have received confirmation that they have received the order.

## 2022-11-22 ENCOUNTER — Ambulatory Visit (INDEPENDENT_AMBULATORY_CARE_PROVIDER_SITE_OTHER): Payer: Commercial Managed Care - HMO | Admitting: Physician Assistant

## 2022-11-22 ENCOUNTER — Encounter (INDEPENDENT_AMBULATORY_CARE_PROVIDER_SITE_OTHER): Payer: Self-pay | Admitting: Physician Assistant

## 2022-11-22 VITALS — BP 172/88 | HR 71 | Temp 98.4°F | Ht 65.0 in | Wt 297.0 lb

## 2022-11-22 DIAGNOSIS — I10 Essential (primary) hypertension: Secondary | ICD-10-CM

## 2022-11-22 DIAGNOSIS — E1169 Type 2 diabetes mellitus with other specified complication: Secondary | ICD-10-CM | POA: Diagnosis not present

## 2022-11-22 DIAGNOSIS — E538 Deficiency of other specified B group vitamins: Secondary | ICD-10-CM | POA: Diagnosis not present

## 2022-11-22 DIAGNOSIS — M545 Low back pain, unspecified: Secondary | ICD-10-CM

## 2022-11-22 DIAGNOSIS — E559 Vitamin D deficiency, unspecified: Secondary | ICD-10-CM | POA: Diagnosis not present

## 2022-11-22 DIAGNOSIS — Z7984 Long term (current) use of oral hypoglycemic drugs: Secondary | ICD-10-CM

## 2022-11-22 DIAGNOSIS — G8929 Other chronic pain: Secondary | ICD-10-CM

## 2022-11-22 DIAGNOSIS — G4733 Obstructive sleep apnea (adult) (pediatric): Secondary | ICD-10-CM

## 2022-11-22 DIAGNOSIS — Z7985 Long-term (current) use of injectable non-insulin antidiabetic drugs: Secondary | ICD-10-CM

## 2022-11-22 DIAGNOSIS — Z6841 Body Mass Index (BMI) 40.0 and over, adult: Secondary | ICD-10-CM

## 2022-11-22 MED ORDER — TRULICITY 1.5 MG/0.5ML ~~LOC~~ SOAJ: 1.5000 mg | SUBCUTANEOUS | 0 refills | Status: DC

## 2022-11-22 MED ORDER — VITAMIN D (ERGOCALCIFEROL) 1.25 MG (50000 UNIT) PO CAPS
ORAL_CAPSULE | ORAL | 0 refills | Status: DC
Start: 2022-11-22 — End: 2023-02-09

## 2022-11-22 NOTE — Progress Notes (Signed)
.smr  Office: [phone]  /  Fax: [phone]  WEIGHT SUMMARY AND BIOMETRICS  Vitals Temp: 98.4 F (36.9 C) BP: (!) 172/88 Pulse Rate: 71 SpO2: 99 %   Anthropometric Measurements Height: 5\' 5"  (1.651 m) Weight: 297 lb (134.7 kg) BMI (Calculated): 49.42 Weight at Last Visit: 298 lb Weight Lost Since Last Visit: 1 lb Weight Gained Since Last Visit: 0 Starting Weight: 299 lb Total Weight Loss (lbs): 2 lb (0.907 kg) Peak Weight: 370 lb   Body Composition  Body Fat %: 51.7 % Fat Mass (lbs): 153.8 lbs Muscle Mass (lbs): 136.4 lbs Total Body Water (lbs): 101.8 lbs Visceral Fat Rating : 18   Other Clinical Data Fasting: yes Labs: no Today's Visit #: 11 Starting Date: 05/19/22     HPI  Chief Complaint: OBESITY  Gracious is here to discuss her progress with her obesity treatment plan. She is on the the Category 4 Plan and states she is following her eating plan approximately 85 % of the time. She states she is exercising walking 60 minutes 7 times per week.  Discussed the use of AI scribe software for clinical note transcription with the patient, who gave verbal consent to proceed.  History of Present Illness    Interval History:  Since last office visit she down 1 lb.    April Gonzales, a 47 year old female with a history of obesity, type 2 diabetes, vitamin D deficiency, hypertension, obstructive sleep apnea, and chronic low back pain, presents for a follow-up visit. The patient reports significant improvement in blood sugar control since starting to use a Dexcom monitor, with levels consistently below 200 in the past week. The patient finds the monitor helpful in managing diet and avoiding foods that cause blood sugar spikes.  Despite the improvement in blood sugar control, the patient continues to struggle with sleep due to obstructive sleep apnea and chronic low back pain. The patient recently received a CPAP machine but finds it uncomfortable to use due to the force  of the air. The patient plans to discuss this issue with the prescribing doctor.  The patient's stress levels are elevated due to an upcoming move to live with her father. The patient is in the process of packing and moving items, which provides some physical activity. The patient is also working with a therapist and psychiatrist to manage the anxiety associated with the move.  The patient's diet consists of pre-packaged meals, which she modifies to reduce sugar content. The patient reports reduced cravings for sugary drinks and chocolate. The patient is also trying to incorporate more protein into her diet.  The patient's blood pressure was elevated at the visit, possibly due to not taking her medication in the morning and the stress of the upcoming move. The patient plans to take her blood pressure medication after the visit.   Pharmacotherapy: Trulicity 1.5 mg weekly. Denies mass in neck, dysphagia, dyspepsia, persistent hoarseness, abdominal pain, or N/V/Constipation or diarrhea. Has annual eye exam. Mood is stable.  Hx of pancreatitis in past x 2, but tolerating Trulicity well.   TREATMENT PLAN FOR OBESITY:  Recommended Dietary Goals  [name] is currently in the action stage of change. As such, her goal is to continue weight management plan. She has agreed to the Category 4 Plan.  Behavioral Intervention  We discussed the following Behavioral Modification Strategies today: increasing lean protein intake, decreasing simple carbohydrates , increasing vegetables, increasing lower glycemic fruits, avoiding skipping meals, increasing water intake, decreasing eating out or consumption of  processed foods, and making healthy choices when eating convenient foods, emotional eating strategies and understanding the difference between hunger signals and cravings, work on managing stress, creating time for self-care and relaxation measures, continue to practice mindfulness when eating, and planning for  success.  Additional resources provided today: NA  Recommended Physical Activity Goals  Aubrianne has been advised to work up to 150 minutes of moderate intensity aerobic activity a week and strengthening exercises 2-3 times per week for cardiovascular health, weight loss maintenance and preservation of muscle mass.   She has agreed to Continue current level of physical activity  and Think about ways to increase daily physical activity and overcoming barriers to exercise   Pharmacotherapy We discussed various medication options to help [name] with her weight loss efforts and we both agreed to continue trulicity 1.5 mg weekly for Type 2 diabetes management.     Return in about 6 weeks (around 01/03/2023).[name] She was informed of the importance of frequent follow up visits to maximize her success with intensive lifestyle modifications for her multiple health conditions.  PHYSICAL EXAM:  Blood pressure (!) 172/88, pulse 71, temperature 98.4 F (36.9 C), height 5\' 5"  (1.651 m), weight 297 lb (134.7 kg), SpO2 99%. Body mass index is 49.42 kg/m.  General: She is overweight/centrally obese, cooperative, alert, well developed, and in no acute distress. Cushingoid appearance.  PSYCH: Has normal mood, affect and thought process.   CARDIOVASCULAR : HR 70'S BP 172/88- She reports she has not taken her medication yet today.  Lungs: Normal breathing effort, no conversational dyspnea.  DIAGNOSTIC DATA REVIEWED:  BMET    Component Value Date/Time   NA 134 (L) 09/22/2022 0505   K 4.2 09/22/2022 0505   CL 98 09/22/2022 0505   CO2 21 (L) 09/22/2022 0505   GLUCOSE 358 (H) 09/22/2022 0505   BUN 17 09/22/2022 0505   CREATININE 1.01 (H) 09/22/2022 0505   CALCIUM 9.1 09/22/2022 0505   GFRNONAA >60 09/22/2022 0505   GFRAA >60 04/06/2019 1242   Lab Results  Component Value Date   HGBA1C 10.8 (H) 09/08/2022   HGBA1C 6.6 (H) 02/21/2016   Lab Results  Component Value Date   INSULIN 79.5 (H) 05/19/2022    Lab Results  Component Value Date   TSH 1.94 05/27/2022   CBC    Component Value Date/Time   WBC 12.7 (H) 09/22/2022 0505   RBC 5.22 (H) 09/22/2022 0505   HGB 13.4 09/22/2022 0505   HCT 40.6 09/22/2022 0505   PLT 334 09/22/2022 0505   MCV 77.8 (L) 09/22/2022 0505   MCH 25.7 (L) 09/22/2022 0505   MCHC 33.0 09/22/2022 0505   RDW 14.6 09/22/2022 0505   Iron Studies No results found for: "IRON", "TIBC", "FERRITIN", "IRONPCTSAT" Lipid Panel     Component Value Date/Time   CHOL 105 12/24/2021 1052   TRIG 119.0 12/24/2021 1052   HDL 38.60 (L) 12/24/2021 1052   CHOLHDL 3 12/24/2021 1052   VLDL 23.8 12/24/2021 1052   LDLCALC 42 12/24/2021 1052   Hepatic Function Panel     Component Value Date/Time   PROT 7.9 08/24/2022 0354   ALBUMIN 3.7 08/24/2022 0354   AST 20 08/24/2022 0354   ALT 27 08/24/2022 0354   ALKPHOS 89 08/24/2022 0354   BILITOT 0.6 08/24/2022 0354      Component Value Date/Time   TSH 1.94 05/27/2022 1524   Nutritional Lab Results  Component Value Date   VD25OH 19.2 (L) 10/25/2022   VD25OH 4.7 (L)  05/19/2022    ASSOCIATED CONDITIONS ADDRESSED TODAY  ASSESSMENT AND PLAN  Problem List Items Addressed This Visit     Morbid obesity (HCC)   Relevant Medications   Dulaglutide (TRULICITY) 1.5 MG/0.5ML SOPN   B12 deficiency   Type 2 diabetes mellitus with other specified complication (HCC) - Primary   Relevant Medications   Dulaglutide (TRULICITY) 1.5 MG/0.5ML SOPN   Essential hypertension   Vitamin D deficiency   Relevant Medications   Vitamin D, Ergocalciferol, (DRISDOL) 1.25 MG (50000 UNIT) CAPS capsule   Chronic low back pain with left-sided sciatica   Other Visit Diagnoses     OSA (obstructive sleep apnea)         Type 2 Diabetes:  Improved control with HbA1c of 10.8 from 13.8. On glipizide, metformin, insulin glargine, and Trulicity.  Improved glycemic control with Dexcom continuous glucose monitor. Blood sugars consistently below 200.  Patient reports increased mindfulness regarding dietary choices. -Continue current management and Dexcom use. -Follow up with endocrinology in September.  Obstructive Sleep Apnea Patient struggling with CPAP machine use due to discomfort and claustrophobia. -Schedule follow-up appointment with Dr. Devonne Doughty to discuss CPAP issues and potential adjustments.  Vitamin D Deficiency Despite twice weekly supplementation, levels remain low. -Continue Vitamin D supplementation twice weekly. -Recheck levels in a couple of months.  Hypertension Elevated blood pressure noted during visit, possibly due to missed morning medication. -Continue current antihypertensive regimen. -Encourage consistent medication adherence.  Chronic Low Back Pain Ongoing issue, managed intermittently with Lyrica. -Continue current pain management strategy.  Obesity Patient actively working on dietary changes and increased physical activity. Some weight loss noted since 2021. -Encourage continued dietary modifications and physical activity. -Consider adding additional protein sources to meals for satiety and metabolic benefits.  General Health Maintenance / Followup Plans -Follow up appointment scheduled for January 03, 2023.  ATTESTASTION STATEMENTS:  Reviewed by clinician on day of visit: allergies, medications, problem list, medical history, surgical history, family history, social history, and previous encounter notes.   I have personally spent 48  minutes total time today in preparation, patient care, nutritional counseling and documentation for this visit, including the following: review of clinical lab tests; review of medical tests/procedures/services.      Kai Calico, PA-C

## 2022-12-02 ENCOUNTER — Encounter (INDEPENDENT_AMBULATORY_CARE_PROVIDER_SITE_OTHER): Payer: Self-pay

## 2022-12-03 ENCOUNTER — Other Ambulatory Visit: Payer: Self-pay | Admitting: Oncology

## 2022-12-03 DIAGNOSIS — Z006 Encounter for examination for normal comparison and control in clinical research program: Secondary | ICD-10-CM

## 2022-12-06 ENCOUNTER — Ambulatory Visit: Payer: Commercial Managed Care - HMO | Admitting: Podiatry

## 2022-12-06 ENCOUNTER — Encounter: Payer: Self-pay | Admitting: Podiatry

## 2022-12-06 DIAGNOSIS — L97519 Non-pressure chronic ulcer of other part of right foot with unspecified severity: Secondary | ICD-10-CM | POA: Diagnosis not present

## 2022-12-06 DIAGNOSIS — E11621 Type 2 diabetes mellitus with foot ulcer: Secondary | ICD-10-CM | POA: Diagnosis not present

## 2022-12-06 NOTE — Progress Notes (Signed)
  Subjective:  Patient ID: GLENNDA LOUK, female    DOB: October 10, 1975,   MRN: [id number]  Chief Complaint  Patient presents with   Toe Pain    Pt present today with a dark spot under her great toe on the right. Pt stated that she noticed it on Thursday but she don't know how long it's been there and how it got there     47 y.o. female presents for concern of a spot on the bottom of her right great toe that she noticed this past Thursday. Relates on Wednesday she was in a pool and may have done something then. Denies any pain but was concerned with being diabetic.  Patient is diabetic and last A1c was  Lab Results  Component Value Date   HGBA1C 10.8 (H) 09/08/2022   .   PCP:  Avanell Shackleton, NP-C    PCP:  Avanell Shackleton, NP-C    . Denies any other pedal complaints. Denies n/v/f/c.   Past Medical History:  Diagnosis Date   Anemia    Anxiety    Arthritis    B12 deficiency    Back pain    Celiac disease    Chronic fatigue syndrome    Constipation    Depression    DM (diabetes mellitus) (HCC)    Gallbladder problem    GERD (gastroesophageal reflux disease)    Heartburn    History of kidney stones    Hypertension    Joint pain    Pancreatitis    Psoriasis    SOBOE (shortness of breath on exertion)    Vitamin D deficiency     Objective:  Physical Exam: Vascular: DP/PT pulses 2/4 bilateral. CFT <3 seconds. Normal hair growth on digits. No edema.  Skin. No lacerations or abrasions bilateral feet. Black 2 cm spot on bottom of right great toe upon debridement superficial ulceration noted about 1 cm x 0.5 cm with granular base Musculoskeletal: MMT 5/5 bilateral lower extremities in DF, PF, Inversion and Eversion. Deceased ROM in DF of ankle joint.  Neurological: Sensation intact to light touch.   Assessment:   1. Diabetic ulcer of right great toe [facility])      Plan:  Patient was evaluated and treated and all questions answered. Ulcer right hallux limited to breakdown of  skin -Debridement as below. -Dressed with neosporin, DSD. -No abx indicated.  -Discussed glucose control and proper protein-rich diet.  -Discussed if any worsening redness, pain, fever or chills to call or may need to report to the emergency room. Patient expressed understanding.   Procedure: Excisional Debridement of Wound Rationale: Removal of non-viable soft tissue from the wound to promote healing.  Anesthesia: none Pre-Debridement Wound Measurements: Overlying blood blister Post-Debridement Wound Measurements: 0.5 cm x 1 cm x 0.1 cm  Type of Debridement: Sharp Excisional Tissue Removed: Non-viable soft tissue Depth of Debridement: subcutaneous tissue. Technique: Sharp excisional debridement to bleeding, viable wound base.  Dressing: Dry, sterile, compression dressing. Disposition: Patient tolerated procedure well. Patient to return in 2 week for follow-up.  Return in about 2 weeks (around 12/20/2022) for wound check.   Louann Sjogren, DPM

## 2022-12-09 ENCOUNTER — Encounter (INDEPENDENT_AMBULATORY_CARE_PROVIDER_SITE_OTHER): Payer: Self-pay

## 2022-12-09 DIAGNOSIS — E1169 Type 2 diabetes mellitus with other specified complication: Secondary | ICD-10-CM

## 2022-12-13 ENCOUNTER — Ambulatory Visit: Payer: Commercial Managed Care - HMO | Admitting: Nurse Practitioner

## 2022-12-13 ENCOUNTER — Ambulatory Visit: Payer: Commercial Managed Care - HMO | Admitting: Nutrition

## 2022-12-15 MED ORDER — TRULICITY 1.5 MG/0.5ML ~~LOC~~ SOAJ
1.5000 mg | SUBCUTANEOUS | 0 refills | Status: DC
Start: 2022-12-15 — End: 2023-02-09

## 2022-12-15 NOTE — Telephone Encounter (Signed)
Call to [facility] [phone], spoke to [place], Occupational psychologist.  Medication Trulicity is only able to be sent in for a 28 day supply to be covered.  No 90 day prescription for this medication.  Sent in new prescription for a 2 mL quantity.    Call to patient to ask which pharmacy she would like it sent to.  [facility] chose the [facility] on [address].  Prescription has been sent and patient has been notified.

## 2022-12-17 ENCOUNTER — Other Ambulatory Visit (HOSPITAL_COMMUNITY): Payer: Self-pay

## 2022-12-21 ENCOUNTER — Telehealth (INDEPENDENT_AMBULATORY_CARE_PROVIDER_SITE_OTHER): Payer: Self-pay

## 2022-12-21 NOTE — Telephone Encounter (Signed)
Response from insurance company for Trulicity:   Drug is covered by current benefit plan. No further PA activity needed

## 2022-12-22 ENCOUNTER — Ambulatory Visit (INDEPENDENT_AMBULATORY_CARE_PROVIDER_SITE_OTHER): Payer: Commercial Managed Care - HMO | Admitting: Podiatry

## 2022-12-22 ENCOUNTER — Encounter: Payer: Self-pay | Admitting: Podiatry

## 2022-12-22 DIAGNOSIS — L97519 Non-pressure chronic ulcer of other part of right foot with unspecified severity: Secondary | ICD-10-CM

## 2022-12-22 DIAGNOSIS — E11621 Type 2 diabetes mellitus with foot ulcer: Secondary | ICD-10-CM | POA: Diagnosis not present

## 2022-12-22 NOTE — Progress Notes (Signed)
  Subjective:  Patient ID: April Gonzales, female    DOB: 1975/08/18,   MRN: [id number]  Chief Complaint  Patient presents with   Wound Check    Right great toe is doing good, denies any pain    47 y.o. female presents for  follow-up of right toe ulcer. Relates doing well and dressing as instructed.   Patient is diabetic and last A1c was  Lab Results  Component Value Date   HGBA1C 10.8 (H) 09/08/2022   .   PCP:  Avanell Shackleton, NP-C    PCP:  Avanell Shackleton, NP-C    . Denies any other pedal complaints. Denies n/v/f/c.   Past Medical History:  Diagnosis Date   Anemia    Anxiety    Arthritis    B12 deficiency    Back pain    Celiac disease    Chronic fatigue syndrome    Constipation    Depression    DM (diabetes mellitus) (HCC)    Gallbladder problem    GERD (gastroesophageal reflux disease)    Heartburn    History of kidney stones    Hypertension    Joint pain    Pancreatitis    Psoriasis    SOBOE (shortness of breath on exertion)    Vitamin D deficiency     Objective:  Physical Exam: Vascular: DP/PT pulses 2/4 bilateral. CFT <3 seconds. Normal hair growth on digits. No edema.  Skin. No lacerations or abrasions bilateral feet. Right foot ulceration great toe healed. No erythema edema or purulence noted.  Musculoskeletal: MMT 5/5 bilateral lower extremities in DF, PF, Inversion and Eversion. Deceased ROM in DF of ankle joint.  Neurological: Sensation intact to light touch.   Assessment:   1. Diabetic ulcer of right great toe [facility])       Plan:  Patient was evaluated and treated and all questions answered. Ulcer right hallux -healed No debridement necessary -No abx indicated.  -Discussed glucose control and proper protein-rich diet.  -Discussed if any worsening redness, pain, fever or chills to call or may need to report to the emergency room. Patient expressed understanding.  Follow-up in 4 weeks final wound check and rfc.   Return in about 4 weeks  (around 01/19/2023) for rfc.   Louann Sjogren, DPM

## 2022-12-27 ENCOUNTER — Other Ambulatory Visit (INDEPENDENT_AMBULATORY_CARE_PROVIDER_SITE_OTHER): Payer: Self-pay | Admitting: Physician Assistant

## 2022-12-27 DIAGNOSIS — E559 Vitamin D deficiency, unspecified: Secondary | ICD-10-CM

## 2022-12-29 ENCOUNTER — Encounter: Payer: Self-pay | Admitting: Family Medicine

## 2022-12-29 ENCOUNTER — Ambulatory Visit (INDEPENDENT_AMBULATORY_CARE_PROVIDER_SITE_OTHER): Payer: Commercial Managed Care - HMO | Admitting: Family Medicine

## 2022-12-29 VITALS — BP 138/88 | HR 88 | Temp 98.0°F | Wt 298.0 lb

## 2022-12-29 DIAGNOSIS — E1165 Type 2 diabetes mellitus with hyperglycemia: Secondary | ICD-10-CM

## 2022-12-29 DIAGNOSIS — E1159 Type 2 diabetes mellitus with other circulatory complications: Secondary | ICD-10-CM

## 2022-12-29 DIAGNOSIS — Z1211 Encounter for screening for malignant neoplasm of colon: Secondary | ICD-10-CM | POA: Diagnosis not present

## 2022-12-29 DIAGNOSIS — I152 Hypertension secondary to endocrine disorders: Secondary | ICD-10-CM | POA: Diagnosis not present

## 2022-12-29 DIAGNOSIS — Z23 Encounter for immunization: Secondary | ICD-10-CM

## 2022-12-29 DIAGNOSIS — Z7985 Long-term (current) use of injectable non-insulin antidiabetic drugs: Secondary | ICD-10-CM

## 2022-12-29 NOTE — Patient Instructions (Signed)
Monitor your blood pressure at home. If your readings are not consistently 130/80 or lower, let us know.   I have ordered a new Cologuard test for you to screen for colon cancer. It will come in the mail.   Call and schedule an eye exam.   Schedule your yearly OB/GYN appointment.

## 2022-12-29 NOTE — Progress Notes (Signed)
Subjective:     Patient ID: April Gonzales, female    DOB: 10-01-1975, 47 y.o.   MRN: [id number]  Chief Complaint  Patient presents with   Follow-up    3 month follow up    HPI  Discussed the use of AI scribe software for clinical note transcription with the patient, who gave verbal consent to proceed.  History of Present Illness         Here for follow up on chronic health conditions.  Moving back to [place].  On disability   Seeing [facility]  Now under the care of endocrinology for diabetes. Next visit tomorrow.  Wearing a Dexcom and monitoring blood sugars   No new concerns.   Taking vitamin D supplement.   OB/GYN appt last year.   No eye exam in the past year     Health Maintenance Due  Topic Date Due   COVID-19 Vaccine (1) Never done   OPHTHALMOLOGY EXAM  Never done   HIV Screening  Never done   Hepatitis C Screening  Never done   PAP SMEAR-Modifier  Never done   Colonoscopy  Never done    Past Medical History:  Diagnosis Date   Anemia    Anxiety    Arthritis    B12 deficiency    Back pain    Celiac disease    Chronic fatigue syndrome    Constipation    Depression    DM (diabetes mellitus) (HCC)    Gallbladder problem    GERD (gastroesophageal reflux disease)    Heartburn    History of kidney stones    Hypertension    Joint pain    Pancreatitis    Psoriasis    SOBOE (shortness of breath on exertion)    Vitamin D deficiency     Past Surgical History:  Procedure Laterality Date   CHOLECYSTECTOMY     CYSTOSCOPY/URETEROSCOPY/HOLMIUM LASER/STENT PLACEMENT Right 09/21/2022   Procedure: CYSTOSCOPY RIGHT URETEROSCOPY/HOLMIUM LASER/STENT PLACEMENT;  Surgeon: [name], MD;  Location: [facility];  Service: Urology;  Laterality: Right;  75 MINS FOR CASE   WISDOM TOOTH EXTRACTION      Family History  Problem Relation Age of Onset   Hyperlipidemia Mother    Diabetes Mother    Diabetes Mellitus II Mother    Hypertension Mother    Obesity Mother     Obesity Father    Hypertension Father    CAD Father    Chronic Renal Failure Father    Hyperlipidemia Father    Heart disease Father    Kidney disease Father    Alcoholism Father     Social History   Socioeconomic History   Marital status: Single    Spouse name: Not on file   Number of children: Not on file   Years of education: Not on file   Highest education level: Associate degree: academic program  Occupational History   Occupation: Currently on leave of absence  Tobacco Use   Smoking status: Never   Smokeless tobacco: Never  Vaping Use   Vaping status: Never Used  Substance and Sexual Activity   Alcohol use: No   Drug use: No   Sexual activity: Not on file  Other Topics Concern   Not on file  Social History Narrative   Not on file   Social Determinants of Health   Financial Resource Strain: Low Risk  (09/24/2022)   Overall Financial Resource Strain (CARDIA)    Difficulty of Paying Living Expenses: Not  hard at all  Food Insecurity: Food Insecurity Present (09/24/2022)   Hunger Vital Sign    Worried About Running Out of Food in the Last Year: Sometimes true    Ran Out of Food in the Last Year: Never true  Transportation Needs: No Transportation Needs (09/24/2022)   PRAPARE - Administrator, Civil Service (Medical): No    Lack of Transportation (Non-Medical): No  Physical Activity: Insufficiently Active (09/24/2022)   Exercise Vital Sign    Days of Exercise per Week: 2 days    Minutes of Exercise per Session: 10 min  Stress: Stress Concern Present (09/24/2022)   [employer] of Occupational Health - Occupational Stress Questionnaire    Feeling of Stress : Rather much  Social Connections: Socially Isolated (09/24/2022)   Social Connection and Isolation Panel [NHANES]    Frequency of Communication with Friends and Family: Once a week    Frequency of Social Gatherings with Friends and Family: Once a week    Attends Religious Services: Never    Automotive engineer or Organizations: No    Attends Engineer, structural: Not on file    Marital Status: Never married  Intimate Partner Violence: Not on file    Outpatient Medications Prior to Visit  Medication Sig Dispense Refill   busPIRone (BUSPAR) 5 MG tablet Take 5 mg by mouth 2 (two) times daily.     Continuous Glucose Sensor (DEXCOM G7 SENSOR) MISC Inject 1 Application into the skin as directed. Change sensor every 10 days as directed. 9 each 3   Dulaglutide (TRULICITY) 1.5 MG/0.5ML SOPN Inject 1.5 mg into the skin once a week. 2 mL 0   escitalopram (LEXAPRO) 20 MG tablet Take 1 tablet (20 mg total) by mouth daily. 30 tablet 2   glipiZIDE (GLUCOTROL XL) 5 MG 24 hr tablet TAKE 1 TABLET BY MOUTH EVERY DAY WITH BREAKFAST 90 tablet 1   hydrocortisone-pramoxine (PROCTOFOAM-HC) rectal foam Place 1 applicator rectally 2 (two) times daily. 10 g 1   Insulin Glargine (BASAGLAR KWIKPEN) 100 UNIT/ML Inject 30 Units into the skin at bedtime. 30 mL 3   Insulin Pen Needle (PEN NEEDLES) 31G X 8 MM MISC Use to inject insulin once daily 100 each 3   metFORMIN (GLUCOPHAGE) 1000 MG tablet TAKE 1 TABLET (1,000 MG TOTAL) BY MOUTH TWICE A DAY WITH FOOD 180 tablet 1   pregabalin (LYRICA) 75 MG capsule Take 75 mg by mouth 2 (two) times daily as needed (pain).     Risankizumab-rzaa (SKYRIZI) 150 MG/ML SOSY Inject 150 mg into the skin every 3 (three) months.     Tapinarof (VTAMA) 1 % CREA Apply 1 Application topically daily as needed (psoriasis).     triamcinolone cream (KENALOG) 0.1 % Apply 1 application topically 2 (two) times daily as needed (psoriasis).      valsartan (DIOVAN) 160 MG tablet Take 1 tablet (160 mg total) by mouth daily. 90 tablet 1   Vitamin D, Ergocalciferol, (DRISDOL) 1.25 MG (50000 UNIT) CAPS capsule TAKE 1 CAPSULE (50,000 UNITS TOTAL) BY MOUTH TWO TIMES A WEEK 8 capsule 0   cephALEXin (KEFLEX) 500 MG capsule Take 1 capsule (500 mg total) by mouth daily. (Patient not taking: Reported  on 12/29/2022) 10 capsule 0   ondansetron (ZOFRAN-ODT) 8 MG disintegrating tablet Take 1 tablet (8 mg total) by mouth every 8 (eight) hours as needed for nausea or vomiting. (Patient not taking: Reported on 12/29/2022) 10 tablet 20   oxyCODONE-acetaminophen (PERCOCET) 10-325  MG tablet Take 1 tablet by mouth every 6 (six) hours as needed for pain (may cause constipation). (Patient not taking: Reported on 12/29/2022) 20 tablet 0   tamsulosin (FLOMAX) 0.4 MG CAPS capsule Take 1 capsule daily until stone passes. (Patient not taking: Reported on 12/29/2022) 30 capsule 0   Urea 45 % CREA Apply 1 Application topically daily as needed (irritation). (Patient not taking: Reported on 12/29/2022)     No facility-administered medications prior to visit.    Allergies  Allergen Reactions   Dilaudid [Hydromorphone Hcl] Nausea And Vomiting   Hydromorphone     Other reaction(s): Unknown   Methotrexate Other (See Comments)    Childhood reaction, foaming at the mouth    Review of Systems  Constitutional:  Negative for chills, fever and malaise/fatigue.  Respiratory:  Negative for shortness of breath.   Cardiovascular:  Negative for chest pain, palpitations and leg swelling.  Gastrointestinal:  Negative for abdominal pain, constipation, diarrhea, nausea and vomiting.  Genitourinary:  Negative for dysuria, frequency and urgency.  Musculoskeletal:  Positive for back pain.  Neurological:  Negative for dizziness and focal weakness.  Psychiatric/Behavioral:  Negative for depression. The patient is not nervous/anxious.        Objective:    Physical Exam Constitutional:      General: She is not in acute distress.    Appearance: She is not ill-appearing.  Eyes:     Extraocular Movements: Extraocular movements intact.     Conjunctiva/sclera: Conjunctivae normal.  Cardiovascular:     Rate and Rhythm: Normal rate.  Pulmonary:     Effort: Pulmonary effort is normal.  Musculoskeletal:     Cervical back: Normal  range of motion and neck supple.     Right lower leg: No edema.     Left lower leg: No edema.  Skin:    General: Skin is warm and dry.  Neurological:     General: No focal deficit present.     Mental Status: She is alert and oriented to person, place, and time.  Psychiatric:        Mood and Affect: Mood normal.        Behavior: Behavior normal.        Thought Content: Thought content normal.      BP 138/88 (BP Location: Right Arm, Patient Position: Sitting, Cuff Size: Large)   Pulse 88   Temp 98 F (36.7 C) (Oral)   Wt 298 lb (135.2 kg)   SpO2 98%   BMI 49.59 kg/m  Wt Readings from Last 3 Encounters:  12/29/22 298 lb (135.2 kg)  11/22/22 297 lb (134.7 kg)  10/25/22 298 lb (135.2 kg)       Assessment & Plan:   Problem List Items Addressed This Visit       Cardiovascular and Mediastinum   Hypertension associated with diabetes (HCC)     Endocrine   Type 2 diabetes mellitus with hyperglycemia, without long-term current use of insulin (HCC)   Other Visit Diagnoses     Need for influenza vaccination    -  Primary   Relevant Orders   Flu vaccine trivalent PF, 6mos and older(Flulaval,Afluria,Fluarix,Fluzone) (Completed)   Screen for colon cancer       Relevant Orders   Cologuard      Reviewed notes from [facility] and endocrinology.  She has a visit tomorrow with endo. No labs done today since she will have labs tomorrow.  She reports feeling healthier.  Cologuard ordered.  Follow up here  for CPE in December.   I am having Shweta A. Cummings maintain her triamcinolone cream, hydrocortisone-pramoxine, escitalopram, Skyrizi, busPIRone, pregabalin, glipiZIDE, metFORMIN, Vtama, Urea, tamsulosin, ondansetron, oxyCODONE-acetaminophen, Basaglar KwikPen, Pen Needles, cephALEXin, valsartan, Dexcom G7 Sensor, Vitamin D (Ergocalciferol), and Trulicity.  No orders of the defined types were placed in this encounter.

## 2022-12-30 ENCOUNTER — Ambulatory Visit: Payer: Commercial Managed Care - HMO | Admitting: Nurse Practitioner

## 2022-12-30 ENCOUNTER — Ambulatory Visit: Payer: Commercial Managed Care - HMO | Admitting: Nutrition

## 2022-12-30 DIAGNOSIS — Z7985 Long-term (current) use of injectable non-insulin antidiabetic drugs: Secondary | ICD-10-CM

## 2022-12-30 DIAGNOSIS — I10 Essential (primary) hypertension: Secondary | ICD-10-CM

## 2022-12-30 DIAGNOSIS — E1165 Type 2 diabetes mellitus with hyperglycemia: Secondary | ICD-10-CM

## 2022-12-30 DIAGNOSIS — Z7984 Long term (current) use of oral hypoglycemic drugs: Secondary | ICD-10-CM

## 2023-01-03 ENCOUNTER — Ambulatory Visit (INDEPENDENT_AMBULATORY_CARE_PROVIDER_SITE_OTHER): Payer: BC Managed Care – PPO | Admitting: Physician Assistant

## 2023-01-03 ENCOUNTER — Encounter (INDEPENDENT_AMBULATORY_CARE_PROVIDER_SITE_OTHER): Payer: Self-pay

## 2023-01-09 ENCOUNTER — Other Ambulatory Visit: Payer: Self-pay | Admitting: Family Medicine

## 2023-01-09 DIAGNOSIS — E1165 Type 2 diabetes mellitus with hyperglycemia: Secondary | ICD-10-CM

## 2023-01-12 ENCOUNTER — Other Ambulatory Visit: Payer: Self-pay | Admitting: Family Medicine

## 2023-01-12 DIAGNOSIS — E1165 Type 2 diabetes mellitus with hyperglycemia: Secondary | ICD-10-CM

## 2023-01-19 ENCOUNTER — Encounter: Payer: Self-pay | Admitting: Podiatry

## 2023-01-19 ENCOUNTER — Ambulatory Visit (INDEPENDENT_AMBULATORY_CARE_PROVIDER_SITE_OTHER): Payer: BC Managed Care – PPO | Admitting: Podiatry

## 2023-01-19 ENCOUNTER — Telehealth: Payer: Self-pay | Admitting: Neurology

## 2023-01-19 DIAGNOSIS — M79674 Pain in right toe(s): Secondary | ICD-10-CM

## 2023-01-19 DIAGNOSIS — B351 Tinea unguium: Secondary | ICD-10-CM

## 2023-01-19 DIAGNOSIS — E1169 Type 2 diabetes mellitus with other specified complication: Secondary | ICD-10-CM | POA: Diagnosis not present

## 2023-01-19 DIAGNOSIS — M79675 Pain in left toe(s): Secondary | ICD-10-CM

## 2023-01-19 NOTE — Progress Notes (Signed)
  Subjective:  Patient ID: April Gonzales, female    DOB: Oct 24, 1975,   MRN: [id number]  Chief Complaint  Patient presents with   Nail Problem    DFC.    47 y.o. female presents for follow-up of right great toe wound. Relates doing well. Also concern of thickened elongated and painful nails that are difficult to trim. Requesting to have them trimmed today. Relates burning and tingling in their feet. Patient is diabetic and last A1c was  Lab Results  Component Value Date   HGBA1C 10.8 (H) 09/08/2022   .   PCP:  Avanell Shackleton, NP-C    . Denies any other pedal complaints. Denies n/v/f/c.   Past Medical History:  Diagnosis Date   Anemia    Anxiety    Arthritis    B12 deficiency    Back pain    Celiac disease    Chronic fatigue syndrome    Constipation    Depression    DM (diabetes mellitus) (HCC)    Gallbladder problem    GERD (gastroesophageal reflux disease)    Heartburn    History of kidney stones    Hypertension    Joint pain    Pancreatitis    Psoriasis    SOBOE (shortness of breath on exertion)    Vitamin D deficiency     Objective:  Physical Exam: Vascular: DP/PT pulses 2/4 bilateral. CFT <3 seconds. Absent hair growth on digits. Edema noted to bilateral lower extremities. Xerosis noted bilaterally.  Skin. No lacerations or abrasions bilateral feet. Nails 1-5 bilateral  are thickened discolored and elongated with subungual debris. Hyperkeratosis mildly on plantar right hallux and wound remains healed.  Musculoskeletal: MMT 5/5 bilateral lower extremities in DF, PF, Inversion and Eversion. Deceased ROM in DF of ankle joint.  Neurological: Sensation intact to light touch. Protective sensation diminished bilateral.    Assessment:   1. Pain due to onychomycosis of toenails of both feet   2. Type 2 diabetes mellitus with other specified complication, without long-term current use of insulin (HCC)      Plan:  Patient was evaluated and treated and all questions  answered. -Right hallux wound well healed.  -Discussed and educated patient on diabetic foot care, especially with  regards to the vascular, neurological and musculoskeletal systems.  -Stressed the importance of good glycemic control and the detriment of not  controlling glucose levels in relation to the foot. -Discussed supportive shoes at all times and checking feet regularly.  -Mechanically debrided all nails 1-5 bilateral using sterile nail nipper and filed with dremel without incident  -Answered all patient questions -Patient to return  in 3 months for at risk foot care -Patient advised to call the office if any problems or questions arise in the meantime.   Louann Sjogren, DPM

## 2023-01-19 NOTE — Telephone Encounter (Signed)
Contacted pt back, informed her per her report, she did have a leak. I asked if she spoke with DME if anything could be done about her feeling to much pressure and she was told its nothing they could do and it could not be adjusted. They told her if she couldn't use it, then she would be charged for it due to noncompliance. I informed her I will have MD review settings and advise before I send orders anywhere else. Pt has moved to [place] and would like DME closer to home.

## 2023-01-19 NOTE — Telephone Encounter (Signed)
We reached out Tammy at [facility] and received this response back from her " I have a manager looking into but at set the patient would not even allow [place] to put the mask completely on her face.  She held it to her face.  She said she intended to wear it we believe that is why she had such a massive leak she only put it on for like 2 minutes at 80% RT stating she probably did not have it on her mouth.  We also have multiple calls to her leaving messages to try to assist her, so I am not sure who she called and did not speak to anyone.  We did have 4 messages for her within her compliance with no success of getting to her.  RT clinical notes describe patient is being very anxious and apprehensive to put mask on.  Possibly patient may be claustrophobic.  There is also a message left earlier in August regarding mask change with no return call.  Our team is required to notate all communication attempts as well as communications with patient."

## 2023-01-19 NOTE — Telephone Encounter (Signed)
Report printed from set up date 7/22

## 2023-01-19 NOTE — Telephone Encounter (Signed)
Pt reports that she returned her CPAP because she felt as if she were drowning, the strong air was being forced down her throat, pt would like to discuss what her other options are.

## 2023-01-19 NOTE — Telephone Encounter (Signed)
Contacted pt back, LVM rq call back

## 2023-01-19 NOTE — Telephone Encounter (Signed)
Pt LVM at 3:30pm: returned phone and would like a call back.

## 2023-01-20 NOTE — Telephone Encounter (Signed)
Reached out to [facility] to find out what occurred with the patient in regard to her set up and compliance.  "I have a manager looking into it, but at set up that patient would not even allow [place] to put the mask completely on her face. She held it to her face. She said she intended to wear it and we believe that's why she had such a massive leak she only put it on for like two minutes at 80% RT stating she probably didn't have it to her mouth.. we also have multiple calls to her leaving messages to try to assist her, so I'm not sure who she called and didn't speak to anyone.  We had left four messages for her within her compliance period with no success of getting her.  RT a clinical notes describe patient is being very anxious and apprehensive to put the mask on. Possibly patient may be claustrophobic. There was also a message left early in August regarding a mask change with no return call. I hope this helps. Our team is required to notate all communications attempts as well as communications with patients." The manager with [facility] also mentioned that she will be contacting the patient today and see if they can discuss and get things resolved.

## 2023-01-20 NOTE — Telephone Encounter (Signed)
Pt returned call, I informed her we touched base with [facility] and informed her of the concerns they had. I advised it would be best, since she wants to transfer DME anyway, to first contact [facility] or [facility] to see if they except her insurance. She will also have to allow RT fit her for mask, if not, she will continue to have leaks and feel that pressure. I also informed her [facility] stated they will call her today to touch base with her. I advised I wasn't sure if insurance would cover equipment moving forward or if she would have to start the process completely over or not. We wouldn't be sure until DME try to file the insurnace. She verbally understood and will see if [facility] or [facility] except her insurance.

## 2023-01-20 NOTE — Telephone Encounter (Signed)
Contacted pt, LVM rq call back  

## 2023-01-26 ENCOUNTER — Encounter: Payer: Managed Care, Other (non HMO) | Admitting: Neurology

## 2023-01-26 ENCOUNTER — Encounter: Payer: Commercial Managed Care - HMO | Admitting: Adult Health

## 2023-01-29 ENCOUNTER — Other Ambulatory Visit: Payer: Self-pay | Admitting: Family Medicine

## 2023-01-29 DIAGNOSIS — Z1231 Encounter for screening mammogram for malignant neoplasm of breast: Secondary | ICD-10-CM

## 2023-02-03 ENCOUNTER — Ambulatory Visit
Admission: RE | Admit: 2023-02-03 | Discharge: 2023-02-03 | Disposition: A | Discharge status: Home / Self Care | Payer: Managed Care, Other (non HMO) | Source: Ambulatory Visit

## 2023-02-03 DIAGNOSIS — Z1231 Encounter for screening mammogram for malignant neoplasm of breast: Secondary | ICD-10-CM

## 2023-02-04 ENCOUNTER — Other Ambulatory Visit (HOSPITAL_COMMUNITY): Payer: Commercial Managed Care - HMO

## 2023-02-08 ENCOUNTER — Other Ambulatory Visit (HOSPITAL_COMMUNITY)
Admission: RE | Admit: 2023-02-08 | Discharge: 2023-02-08 | Disposition: A | Discharge status: Home / Self Care | Payer: BC Managed Care – PPO | Source: Ambulatory Visit | Attending: Oncology | Admitting: Oncology

## 2023-02-08 DIAGNOSIS — Z006 Encounter for examination for normal comparison and control in clinical research program: Secondary | ICD-10-CM | POA: Insufficient documentation

## 2023-02-09 ENCOUNTER — Encounter: Payer: Self-pay | Admitting: Nurse Practitioner

## 2023-02-09 ENCOUNTER — Telehealth: Payer: Self-pay | Admitting: Nurse Practitioner

## 2023-02-09 ENCOUNTER — Ambulatory Visit (INDEPENDENT_AMBULATORY_CARE_PROVIDER_SITE_OTHER): Payer: Managed Care, Other (non HMO) | Admitting: Nurse Practitioner

## 2023-02-09 ENCOUNTER — Encounter: Payer: Managed Care, Other (non HMO) | Attending: Family Medicine | Admitting: Nutrition

## 2023-02-09 ENCOUNTER — Other Ambulatory Visit: Payer: Self-pay | Admitting: Nurse Practitioner

## 2023-02-09 VITALS — Ht 65.0 in | Wt 301.8 lb

## 2023-02-09 VITALS — BP 132/74 | HR 96 | Ht 65.0 in | Wt 301.8 lb

## 2023-02-09 DIAGNOSIS — E1165 Type 2 diabetes mellitus with hyperglycemia: Secondary | ICD-10-CM | POA: Diagnosis not present

## 2023-02-09 DIAGNOSIS — Z7984 Long term (current) use of oral hypoglycemic drugs: Secondary | ICD-10-CM | POA: Diagnosis not present

## 2023-02-09 DIAGNOSIS — E538 Deficiency of other specified B group vitamins: Secondary | ICD-10-CM

## 2023-02-09 DIAGNOSIS — E559 Vitamin D deficiency, unspecified: Secondary | ICD-10-CM

## 2023-02-09 DIAGNOSIS — Z7985 Long-term (current) use of injectable non-insulin antidiabetic drugs: Secondary | ICD-10-CM

## 2023-02-09 DIAGNOSIS — E119 Type 2 diabetes mellitus without complications: Secondary | ICD-10-CM

## 2023-02-09 DIAGNOSIS — I10 Essential (primary) hypertension: Secondary | ICD-10-CM

## 2023-02-09 DIAGNOSIS — R6889 Other general symptoms and signs: Secondary | ICD-10-CM

## 2023-02-09 DIAGNOSIS — Z6841 Body Mass Index (BMI) 40.0 and over, adult: Secondary | ICD-10-CM | POA: Diagnosis not present

## 2023-02-09 DIAGNOSIS — Z713 Dietary counseling and surveillance: Secondary | ICD-10-CM | POA: Diagnosis not present

## 2023-02-09 DIAGNOSIS — E1169 Type 2 diabetes mellitus with other specified complication: Secondary | ICD-10-CM

## 2023-02-09 LAB — POCT GLYCOSYLATED HEMOGLOBIN (HGB A1C): Hemoglobin A1C: 8.1 % — AB (ref 4.0–5.6)

## 2023-02-09 MED ORDER — TRULICITY 1.5 MG/0.5ML ~~LOC~~ SOAJ: 1.5000 mg | SUBCUTANEOUS | 0 refills | Status: DC

## 2023-02-09 MED ORDER — VITAMIN D (ERGOCALCIFEROL) 1.25 MG (50000 UNIT) PO CAPS
50000.0000 [IU] | ORAL_CAPSULE | ORAL | 3 refills | Status: DC
Start: 2023-02-09 — End: 2023-02-09

## 2023-02-09 MED ORDER — BASAGLAR KWIKPEN 100 UNIT/ML ~~LOC~~ SOPN: 30.0000 [IU] | PEN_INJECTOR | Freq: Every day | SUBCUTANEOUS | 3 refills | Status: DC

## 2023-02-09 MED ORDER — VITAMIN D (ERGOCALCIFEROL) 1.25 MG (50000 UNIT) PO CAPS: 50000.0000 [IU] | ORAL_CAPSULE | ORAL | 3 refills | Status: DC

## 2023-02-09 NOTE — Progress Notes (Signed)
Medical Nutrition Therapy  Appointment Start time:  0800  Appointment End time:  0900  Primary concerns today: Type 2 DM and MOrbid obesity  Referral diagnosis: E11.8, [id number]. Preferred learning style: Visuals and hands on.) Learning readiness: Ready    NUTRITION ASSESSMENT Follow up DM 47 yr old wfemale referred for obesity and uncontrolled Type 2 DM.    Currenlty on 30 units of Basaglar, Trulicity, Glipizide, Metformin 1000 mg BID. A1C down to 8.1% from 10.8%. Made better choices and cutting back on portions and not eating late at night.  Sees Ronny Bacon, FNP at [facility]. Also sees [facility] in [place]. PMH Type 2 DM, Morbid Obesity, HTN, Depression, Anxiety, Vitamin D Deficiency. Currenlty on 30 units of Basaglar, Trulicity, Glipizide, Metformin 1000 mg BID. FBS 150-180's. A1C 10.8%.  She sees a therapist for her depression and anxiety. Has been working behavior changes. Diet is still high in processed foods, sodas. She notes she can't stand a long time due to her back and kidney stone issues. Wants to do water therapy for exercises for weight loss.  Has hyperinsulinemia with Insulin levels of 79.5. Vit D very low at 4.7. She is taking Vit D 50,000 units twice a week due to Vit D levels so low.  No cortisol levels for review.  Will be getting a CGM Dexcom ordered from Ronny Bacon, to use to better manage her BS control and comorbidities.   Anthropometrics  Wt Readings from Last 3 Encounters:  02/09/23 (!) 301 lb 12.8 oz (136.9 kg)  02/09/23 (!) 301 lb 12.8 oz (136.9 kg)  12/29/22 298 lb (135.2 kg)   Ht Readings from Last 3 Encounters:  02/09/23 5\' 5"  (1.651 m)  02/09/23 5\' 5"  (1.651 m)  11/22/22 5\' 5"  (1.651 m)   Body mass index is 50.22 kg/m. @BMIFA @ Facility age limit for growth %iles is 20 years. Facility age limit for growth %iles is 20 years.     Clinical Medical Hx: See chart Medications: Lantus, GLipizide, Trulicity, Metformin Labs:   Lab Results  Component Value Date   HGBA1C 10.8 (H) 09/08/2022      Latest Ref Rng & Units 09/22/2022    5:05 AM 09/21/2022    2:39 PM 08/24/2022    3:54 AM  CMP  Glucose 70 - 99 mg/dL 657  846  962   BUN 6 - 20 mg/dL 17  13  17    Creatinine 0.44 - 1.00 mg/dL 9.52  8.41  3.24   Sodium 135 - 145 mmol/L 134  137  133   Potassium 3.5 - 5.1 mmol/L 4.2  3.9  4.1   Chloride 98 - 111 mmol/L 98  102  99   CO2 22 - 32 mmol/L 21  24  21    Calcium 8.9 - 10.3 mg/dL 9.1  8.8  9.0   Total Protein 6.5 - 8.1 g/dL   7.9   Total Bilirubin 0.3 - 1.2 mg/dL   0.6   Alkaline Phos 38 - 126 U/L   89   AST 15 - 41 U/L   20   ALT 0 - 44 U/L   27    Lipid Panel     Component Value Date/Time   CHOL 105 12/24/2021 1052   TRIG 119.0 12/24/2021 1052   HDL 38.60 (L) 12/24/2021 1052   CHOLHDL 3 12/24/2021 1052   VLDL 23.8 12/24/2021 1052   LDLCALC 42 12/24/2021 1052    Notable Signs/Symptoms: Increased thirsty, frequent urination, sleelpy, fatigue, blurry  vision  Lifestyle & Dietary Hx LIves by herself.  Estimated daily fluid intake: 40 oz--drinking more water now.. still drinking sodas Supplements: Vit D Sleep: poor-- stays up at night and sleep in during the day Stress / self-care: yes Current average weekly physical activity: ADL   24-Hr Dietary Recall Eats 2-3 meals per day. Eats out due to lack of ability to stand and cook for long periods of time. Recently started eating more frozen microwavable foods and vegetables. Drinking more water and trying to cut back on sodas.  Estimated Energy Needs Calories: 1200 Carbohydrate: 135g Protein: 90g Fat: 33g   NUTRITION DIAGNOSIS  NI-1.7 Predicted excessive energy intake As related to high calorie diet.  As evidenced by DM TYpe 2 A1C 10.8%, BMI 50 .   NUTRITION INTERVENTION  Nutrition education (E-1) on the following topics:  Nutrition and Diabetes education provided on My Plate, CHO counting, meal planning, portion sizes, timing of meals,  avoiding snacks between meals unless having a low blood sugar, target ranges for A1C and blood sugars, signs/symptoms and treatment of hyper/hypoglycemia, monitoring blood sugars, taking medications as prescribed, benefits of exercising 30 minutes per day and prevention of complications of DM.  Lifestyle Medicine  - Whole Food, Plant Predominant Nutrition is highly recommended: Eat Plenty of vegetables, Mushrooms, fruits, Legumes, Whole Grains, Nuts, seeds in lieu of processed meats, processed snacks/pastries red meat, poultry, eggs.    -It is better to avoid simple carbohydrates including: Cakes, Sweet Desserts, Ice Cream, Soda (diet and regular), Sweet Tea, Candies, Chips, Cookies, Store Bought Juices, Alcohol in Excess of  1-2 drinks a day, Lemonade,  Artificial Sweeteners, Doughnuts, Coffee Creamers, "Sugar-free" Products, etc, etc.  This is not a complete list.....  Exercise: If you are able: 30 -60 minutes a day ,4 days a week, or 150 minutes a week.  The longer the better.  Combine stretch, strength, and aerobic activities.  If you were told in the past that you have high risk for cardiovascular diseases, you may seek evaluation by your heart doctor prior to initiating moderate to intense exercise programs.   Handouts Provided Include  Lifestyle Handouts Know your numbers LIfestyle grocery list  Learning Style & Readiness for Change Teaching method utilized: Visual & Auditory  Demonstrated degree of understanding via: Teach Back  Barriers to learning/adherence to lifestyle change: none  Goals Established by Pt Goals Goals  Eat 1 vegetable with lunch and 2 with dinner every day Eat a piece of fruit with each meal.  Cut out out junk food and sweets Get back to taking your insulin as prescribed 80 oz of water per day Get A1c down to 7%   MONITORING & EVALUATION Dietary intake, weekly physical activity, and weight and blood sugars in 1 month.  Next Steps  Patient is to  work  on eating better balanced meals with whole plant based foods and cutting out sodas.[name]

## 2023-02-09 NOTE — Patient Instructions (Signed)
Goals  Eat 1 vegetable with lunch and 2 with dinner every day Eat a piece of fruit with each meal.  Cut out out junk food and sweets Get back to taking your insulin as prescribed 80 oz of water per day Get A1c down to 7% .

## 2023-02-09 NOTE — Telephone Encounter (Signed)
Pt is scheduled to see [facility] on Tues 1/28 at 3. Please sch pt with you

## 2023-02-09 NOTE — Progress Notes (Signed)
Endocrinology Follow Up Note       02/09/2023, 4:39 PM   Subjective:    Patient ID: April Gonzales, female    DOB: [birth year].  April Gonzales is being seen in follow up after being seen in consultation for management of currently uncontrolled symptomatic diabetes requested by  Hetty Blend L, NP-C.   Past Medical History:  Diagnosis Date   Anemia    Anxiety    Arthritis    B12 deficiency    Back pain    Celiac disease    Chronic fatigue syndrome    Constipation    Depression    DM (diabetes mellitus) (HCC)    Gallbladder problem    GERD (gastroesophageal reflux disease)    Heartburn    History of kidney stones    Hypertension    Joint pain    Pancreatitis    Psoriasis    SOBOE (shortness of breath on exertion)    Vitamin D deficiency     Past Surgical History:  Procedure Laterality Date   CHOLECYSTECTOMY     CYSTOSCOPY/URETEROSCOPY/HOLMIUM LASER/STENT PLACEMENT Right 09/21/2022   Procedure: CYSTOSCOPY RIGHT URETEROSCOPY/HOLMIUM LASER/STENT PLACEMENT;  Surgeon: [name], MD;  Location: [facility];  Service: Urology;  Laterality: Right;  75 MINS FOR CASE   WISDOM TOOTH EXTRACTION      Social History   Socioeconomic History   Marital status: Single    Spouse name: Not on file   Number of children: Not on file   Years of education: Not on file   Highest education level: Associate degree: academic program  Occupational History   Occupation: Currently on leave of absence  Tobacco Use   Smoking status: Never   Smokeless tobacco: Never  Vaping Use   Vaping status: Never Used  Substance and Sexual Activity   Alcohol use: No   Drug use: No   Sexual activity: Not on file  Other Topics Concern   Not on file  Social History Narrative   Not on file   Social Determinants of Health   Financial Resource Strain: Low Risk  (09/24/2022)   Overall Financial Resource Strain (CARDIA)     Difficulty of Paying Living Expenses: Not hard at all  Food Insecurity: Food Insecurity Present (09/24/2022)   Hunger Vital Sign    Worried About Running Out of Food in the Last Year: Sometimes true    Ran Out of Food in the Last Year: Never true  Transportation Needs: No Transportation Needs (09/24/2022)   PRAPARE - Administrator, Civil Service (Medical): No    Lack of Transportation (Non-Medical): No  Physical Activity: Insufficiently Active (09/24/2022)   Exercise Vital Sign    Days of Exercise per Week: 2 days    Minutes of Exercise per Session: 10 min  Stress: Stress Concern Present (09/24/2022)   [employer] of Occupational Health - Occupational Stress Questionnaire    Feeling of Stress : Rather much  Social Connections: Socially Isolated (09/24/2022)   Social Connection and Isolation Panel [NHANES]    Frequency of Communication with Friends and Family: Once a week    Frequency of Social Gatherings with Friends and Family: Once a  week    Attends Religious Services: Never    Active Member of Clubs or Organizations: No    Attends Engineer, structural: Not on file    Marital Status: Never married    Family History  Problem Relation Age of Onset   Hyperlipidemia Mother    Diabetes Mother    Diabetes Mellitus II Mother    Hypertension Mother    Obesity Mother    Obesity Father    Hypertension Father    CAD Father    Chronic Renal Failure Father    Hyperlipidemia Father    Heart disease Father    Kidney disease Father    Alcoholism Father     Outpatient Encounter Medications as of 02/09/2023  Medication Sig   busPIRone (BUSPAR) 5 MG tablet Take 5 mg by mouth 2 (two) times daily.   Continuous Glucose Sensor (DEXCOM G7 SENSOR) MISC Inject 1 Application into the skin as directed. Change sensor every 10 days as directed.   escitalopram (LEXAPRO) 20 MG tablet Take 1 tablet (20 mg total) by mouth daily.   glipiZIDE (GLUCOTROL XL) 5 MG 24 hr tablet TAKE 1  TABLET BY MOUTH EVERY DAY WITH BREAKFAST   hydrocortisone-pramoxine (PROCTOFOAM-HC) rectal foam Place 1 applicator rectally 2 (two) times daily.   Insulin Pen Needle (PEN NEEDLES) 31G X 8 MM MISC Use to inject insulin once daily   metFORMIN (GLUCOPHAGE) 1000 MG tablet TAKE 1 TABLET (1,000 MG TOTAL) BY MOUTH TWICE A DAY WITH FOOD   pregabalin (LYRICA) 75 MG capsule Take 75 mg by mouth 2 (two) times daily as needed (pain).   Risankizumab-rzaa (SKYRIZI) 150 MG/ML SOSY Inject 150 mg into the skin every 3 (three) months.   Tapinarof (VTAMA) 1 % CREA Apply 1 Application topically daily as needed (psoriasis).   triamcinolone cream (KENALOG) 0.1 % Apply 1 application topically 2 (two) times daily as needed (psoriasis).    valsartan (DIOVAN) 160 MG tablet Take 1 tablet (160 mg total) by mouth daily.   [DISCONTINUED] Dulaglutide (TRULICITY) 1.5 MG/0.5ML SOPN Inject 1.5 mg into the skin once a week.   [DISCONTINUED] Insulin Glargine (BASAGLAR KWIKPEN) 100 UNIT/ML Inject 30 Units into the skin at bedtime.   [DISCONTINUED] Vitamin D, Ergocalciferol, (DRISDOL) 1.25 MG (50000 UNIT) CAPS capsule TAKE 1 CAPSULE (50,000 UNITS TOTAL) BY MOUTH TWO TIMES A WEEK   Dulaglutide (TRULICITY) 1.5 MG/0.5ML SOAJ Inject 1.5 mg into the skin once a week.   Insulin Glargine (BASAGLAR KWIKPEN) 100 UNIT/ML Inject 30 Units into the skin at bedtime.   Vitamin D, Ergocalciferol, (DRISDOL) 1.25 MG (50000 UNIT) CAPS capsule Take 1 capsule (50,000 Units total) by mouth every 7 (seven) days. TAKE 1 CAPSULE (50,000 UNITS TOTAL) BY MOUTH TWO TIMES A WEEK   [DISCONTINUED] cephALEXin (KEFLEX) 500 MG capsule Take 1 capsule (500 mg total) by mouth daily. (Patient not taking: Reported on 12/29/2022)   [DISCONTINUED] Dulaglutide (TRULICITY) 1.5 MG/0.5ML SOAJ Inject 1.5 mg into the skin once a week.   [DISCONTINUED] Insulin Glargine (BASAGLAR KWIKPEN) 100 UNIT/ML Inject 30 Units into the skin at bedtime.   [DISCONTINUED] ondansetron (ZOFRAN-ODT) 8  MG disintegrating tablet Take 1 tablet (8 mg total) by mouth every 8 (eight) hours as needed for nausea or vomiting. (Patient not taking: Reported on 12/29/2022)   [DISCONTINUED] oxyCODONE-acetaminophen (PERCOCET) 10-325 MG tablet Take 1 tablet by mouth every 6 (six) hours as needed for pain (may cause constipation). (Patient not taking: Reported on 12/29/2022)   [DISCONTINUED] tamsulosin (FLOMAX) 0.4 MG CAPS  capsule Take 1 capsule daily until stone passes. (Patient not taking: Reported on 12/29/2022)   [DISCONTINUED] Urea 45 % CREA Apply 1 Application topically daily as needed (irritation). (Patient not taking: Reported on 12/29/2022)   [DISCONTINUED] Vitamin D, Ergocalciferol, (DRISDOL) 1.25 MG (50000 UNIT) CAPS capsule Take 1 capsule (50,000 Units total) by mouth every 7 (seven) days. TAKE 1 CAPSULE (50,000 UNITS TOTAL) BY MOUTH TWO TIMES A WEEK   No facility-administered encounter medications on file as of 02/09/2023.    ALLERGIES: Allergies  Allergen Reactions   Dilaudid [Hydromorphone Hcl] Nausea And Vomiting   Hydromorphone     Other reaction(s): Unknown   Methotrexate Other (See Comments)    Childhood reaction, foaming at the mouth    VACCINATION STATUS: Immunization History  Administered Date(s) Administered   Influenza, Seasonal, Injecte, Preservative Fre 12/29/2022   Influenza,inj,Quad PF,6+ Mos 12/24/2021   Tdap 10/11/2020    Diabetes She presents for her follow-up diabetic visit. She has type 2 diabetes mellitus. Onset time: formally diagnosed at age 55. Her disease course has been fluctuating. There are no hypoglycemic associated symptoms. Associated symptoms include fatigue, polydipsia and polyuria. Pertinent negatives for diabetes include no weight loss. There are no hypoglycemic complications. Symptoms are stable. There are no diabetic complications. Risk factors for coronary artery disease include diabetes mellitus, dyslipidemia, obesity, hypertension and sedentary  lifestyle. Current diabetic treatment includes oral agent (triple therapy) and insulin injections (and Trulicity). She is compliant with treatment most of the time (ran out of insulin 2 weeks ago). Her weight is stable. She is following a generally unhealthy diet. When asked about meal planning, she reported none. She has not had a previous visit with a dietitian. She rarely participates in exercise. Her home blood glucose trend is increasing steadily. Her overall blood glucose range is >200 mg/dl. (She presents today with her CGM showing gross hyperglycemia overall.  Her POCT A1c today is 8.1%, improving from last visit of 10.8%.  She ran out of insulin about 2 weeks ago and did not call to get refill processed.  Analysis of her CGM shows TIR 5%, TAR 95%, TBR 0%.  She notes she moved in with her dad and he cooks fatty southern comfort foods that are not helping her glucose levels either.) An ACE inhibitor/angiotensin II receptor blocker is being taken. She does not see a podiatrist.Eye exam is not current.     Review of systems  Constitutional: + Minimally fluctuating body weight, current Body mass index is 50.22 kg/m., no fatigue, no subjective hyperthermia, no subjective hypothermia Eyes: no blurry vision, no xerophthalmia ENT: no sore throat, no nodules palpated in throat, no dysphagia/odynophagia, no hoarseness Cardiovascular: no chest pain, no shortness of breath, no palpitations, no leg swelling Respiratory: no cough, no shortness of breath Gastrointestinal: no nausea/vomiting/diarrhea Musculoskeletal: no muscle/joint aches Skin: + dry flaky skin to BLE, + hyperemia to BLE Neurological: no tremors, no numbness, no tingling, no dizziness Psychiatric: no depression, no anxiety  Objective:     BP 132/74 (BP Location: Left Arm, Patient Position: Sitting, Cuff Size: Large)   Pulse 96   Ht 5\' 5"  (1.651 m)   Wt (!) 301 lb 12.8 oz (136.9 kg)   BMI 50.22 kg/m   Wt Readings from Last 3  Encounters:  02/09/23 (!) 301 lb 12.8 oz (136.9 kg)  02/09/23 (!) 301 lb 12.8 oz (136.9 kg)  12/29/22 298 lb (135.2 kg)     BP Readings from Last 3 Encounters:  02/09/23 132/74  12/29/22  138/88  11/22/22 (!) 172/88     Physical Exam- Limited  Constitutional:  Body mass index is 50.22 kg/m. , not in acute distress, normal state of mind, + cushingoid appearance with dorsocervical fat pad present and moon face Eyes:  EOMI, no exophthalmos Musculoskeletal: no gross deformities, strength intact in all four extremities, no gross restriction of joint movements Skin:  + dry flaky skin to BLE, + hyperemia to BLE Neurological: no tremor with outstretched hands   Diabetic Foot Exam - Simple   No data filed     CMP ( most recent) CMP     Component Value Date/Time   NA 134 (L) 09/22/2022 0505   K 4.2 09/22/2022 0505   CL 98 09/22/2022 0505   CO2 21 (L) 09/22/2022 0505   GLUCOSE 358 (H) 09/22/2022 0505   BUN 17 09/22/2022 0505   CREATININE 1.01 (H) 09/22/2022 0505   CALCIUM 9.1 09/22/2022 0505   PROT 7.9 08/24/2022 0354   ALBUMIN 3.7 08/24/2022 0354   AST 20 08/24/2022 0354   ALT 27 08/24/2022 0354   ALKPHOS 89 08/24/2022 0354   BILITOT 0.6 08/24/2022 0354   GFRNONAA >60 09/22/2022 0505   GFRAA >60 04/06/2019 1242     Diabetic Labs (most recent): Lab Results  Component Value Date   HGBA1C 8.1 (A) 02/09/2023   HGBA1C 10.8 (H) 09/08/2022   HGBA1C 12.3 (H) 05/27/2022   MICROALBUR 7.9 (H) 02/26/2022     Lipid Panel ( most recent) Lipid Panel     Component Value Date/Time   CHOL 105 12/24/2021 1052   TRIG 119.0 12/24/2021 1052   HDL 38.60 (L) 12/24/2021 1052   CHOLHDL 3 12/24/2021 1052   VLDL 23.8 12/24/2021 1052   LDLCALC 42 12/24/2021 1052      Lab Results  Component Value Date   TSH 1.94 05/27/2022   TSH 2.11 02/26/2022   TSH 1.95 11/19/2021   FREET4 1.10 11/19/2021           Assessment & Plan:   1) Type 2 diabetes mellitus with hyperglycemia,  without long-term current use of insulin  She presents today with her CGM showing gross hyperglycemia overall.  Her POCT A1c today is 8.1%, improving from last visit of 10.8%.  She ran out of insulin about 2 weeks ago and did not call to get refill processed.  Analysis of her CGM shows TIR 5%, TAR 95%, TBR 0%.  She notes she moved in with her dad and he cooks fatty southern comfort foods that are not helping her glucose levels either.  - April Gonzales has currently uncontrolled symptomatic type 2 DM since 47 years of age.   -Recent labs reviewed.  - I had a long discussion with her about the progressive nature of diabetes and the pathology behind its complications. -her diabetes is complicated by pancreatitis (on 2 separate occasions) and she remains at a high risk for more acute and chronic complications which include CAD, CVA, CKD, retinopathy, and neuropathy. These are all discussed in detail with her.  The following Lifestyle Medicine recommendations according to American College of Lifestyle Medicine [facility]) were discussed and offered to patient and she agrees to start the journey:  A. Whole Foods, Plant-based plate comprising of fruits and vegetables, plant-based proteins, whole-grain carbohydrates was discussed in detail with the patient.   A list for source of those nutrients were also provided to the patient.  Patient will use only water or unsweetened tea for hydration. B.  The need  to stay away from risky substances including alcohol, smoking; obtaining 7 to 9 hours of restorative sleep, at least 150 minutes of moderate intensity exercise weekly, the importance of healthy social connections,  and stress reduction techniques were discussed. C.  A full color page of  Calorie density of various food groups per pound showing examples of each food groups was provided to the patient.  - Nutritional counseling repeated at each appointment due to patients tendency to fall back in to old  habits.  - The patient admits there is a room for improvement in their diet and drink choices. -  Suggestion is made for the patient to avoid simple carbohydrates from their diet including Cakes, Sweet Desserts / Pastries, Ice Cream, Soda (diet and regular), Sweet Tea, Candies, Chips, Cookies, Sweet Pastries, Store Bought Juices, Alcohol in Excess of 1-2 drinks a day, Artificial Sweeteners, Coffee Creamer, and "Sugar-free" Products. This will help patient to have stable blood glucose profile and potentially avoid unintended weight gain.   - I encouraged the patient to switch to unprocessed or minimally processed complex starch and increased protein intake (animal or plant source), fruits, and vegetables.   - Patient is advised to stick to a routine mealtimes to eat 3 meals a day and avoid unnecessary snacks (to snack only to correct hypoglycemia).  - she will be scheduled with Norm Salt, RDN, CDE for diabetes education.  - I have approached her with the following individualized plan to manage her diabetes and patient agrees:   - she is advised to continue Metformin 1000 mg po twice daily with meals and Glipizide 5 mg XL po daily, therapeutically suitable for patient.  She can also continue Trulicity 1.5 mg SQ weekly (will not increase due to risk of pancreatitis).   I restarted her Basaglar 30 units SQ nightly (gave her sample Toujeo pen from the office today to hold her over).   --she is encouraged to continue monitoring glucose 2 times daily (using her CGM), before breakfast and before bed, to and to call the clinic if she has readings less than 70 or above 300 for 3 tests in a row.  - Adjustment parameters are given to her for hypo and hyperglycemia in writing.  - her Marcelline Deist was previously discontinued, risk outweighs benefit for this patient- has had yeast and UTI infections since starting this medication.  - she is not an ideal candidate for incretin therapy given her history of  pancreatitis on 2 separate occasions in the past.  However, she has been on Trulicity 1.5 mg SQ weekly and tolerating it well, therefore she can continue it for now.    - Specific targets for  A1c; LDL, HDL, and Triglycerides were discussed with the patient.  2) Blood Pressure /Hypertension:  her blood pressure is controlled to target.   she is advised to continue her current medications including Lisinopril 40 mg p.o. daily with breakfast.  3) Lipids/Hyperlipidemia:    Review of her recent lipid panel from 12/24/21 showed controlled LDL at 42 . She is not currently on any lipid lowering medications.  4)  Weight/Diet:  her Body mass index is 50.22 kg/m.  -  clearly complicating her diabetes care.   she is a candidate for weight loss. I discussed with her the fact that loss of 5 - 10% of her  current body weight will have the most impact on her diabetes management.  Exercise, and detailed carbohydrates information provided  -  detailed on discharge instructions.  5) Chronic Care/Health Maintenance: -she is on ACEI/ARB and not on Statin medications and is encouraged to initiate and continue to follow up with Ophthalmology, Dentist, Podiatrist at least yearly or according to recommendations, and advised to stay away from smoking. I have recommended yearly flu vaccine and pneumonia vaccine at least every 5 years; moderate intensity exercise for up to 150 minutes weekly; and sleep for at least 7 hours a day.  6) Cushingoid appearance Will check am-cortisol and urine cortisol level to assess for possible cushings disease.  If labs return abnormal, she is aware that her care will be transferred to Dr. Fransico Him for further evaluation and treatment.  7) Vitamin D deficiency Her recent vitamin D level was low at 19.2.  She has been on Ergocalciferol in the past.  I reinitiated it at 50000 units PO weekly with several refills for now.  - she is advised to maintain close follow up with Avanell Shackleton, NP-C  for primary care needs, as well as her other providers for optimal and coordinated care.     I spent  46  minutes in the care of the patient today including review of labs from CMP, Lipids, Thyroid Function, Hematology (current and previous including abstractions from other facilities); face-to-face time discussing  her blood glucose readings/logs, discussing hypoglycemia and hyperglycemia episodes and symptoms, medications doses, her options of short and long term treatment based on the latest standards of care / guidelines;  discussion about incorporating lifestyle medicine;  and documenting the encounter. Risk reduction counseling performed per USPSTF guidelines to reduce obesity and cardiovascular risk factors.     Please refer to Patient Instructions for Blood Glucose Monitoring and Insulin/Medications Dosing Guide"  in media tab for additional information. Please  also refer to " Patient Self Inventory" in the Media  tab for reviewed elements of pertinent patient history.  Harvel Ricks participated in the discussions, expressed understanding, and voiced agreement with the above plans.  All questions were answered to her satisfaction. she is encouraged to contact clinic should she have any questions or concerns prior to her return visit.     Follow up plan: - Return in about 3 months (around 05/12/2023) for Diabetes F/U with A1c in office, Previsit labs, Bring meter and logs.   Ronny Bacon, [facility] [address] [place], [place] [address] Phone: [phone] Fax: [phone]  02/09/2023, 4:39 PM

## 2023-02-18 LAB — GENECONNECT MOLECULAR SCREEN

## 2023-02-18 LAB — HELIX MOLECULAR SCREEN: Genetic Analysis Overall Interpretation: NEGATIVE

## 2023-02-23 ENCOUNTER — Other Ambulatory Visit: Payer: Self-pay | Admitting: Nurse Practitioner

## 2023-03-15 ENCOUNTER — Encounter: Payer: Self-pay | Admitting: Nutrition

## 2023-03-26 ENCOUNTER — Other Ambulatory Visit: Payer: Self-pay | Admitting: Family Medicine

## 2023-03-26 DIAGNOSIS — I10 Essential (primary) hypertension: Secondary | ICD-10-CM

## 2023-03-30 ENCOUNTER — Ambulatory Visit: Payer: Commercial Managed Care - HMO | Admitting: Family Medicine

## 2023-04-07 ENCOUNTER — Encounter: Payer: Self-pay | Admitting: Family Medicine

## 2023-04-07 ENCOUNTER — Ambulatory Visit: Payer: Commercial Managed Care - HMO | Admitting: Family Medicine

## 2023-04-07 VITALS — BP 134/82 | HR 86 | Temp 97.6°F | Ht 65.0 in | Wt 303.0 lb

## 2023-04-07 DIAGNOSIS — F32A Depression, unspecified: Secondary | ICD-10-CM

## 2023-04-07 DIAGNOSIS — G4733 Obstructive sleep apnea (adult) (pediatric): Secondary | ICD-10-CM

## 2023-04-07 DIAGNOSIS — I10 Essential (primary) hypertension: Secondary | ICD-10-CM | POA: Diagnosis not present

## 2023-04-07 DIAGNOSIS — E1165 Type 2 diabetes mellitus with hyperglycemia: Secondary | ICD-10-CM | POA: Diagnosis not present

## 2023-04-07 DIAGNOSIS — F419 Anxiety disorder, unspecified: Secondary | ICD-10-CM

## 2023-04-07 NOTE — Assessment & Plan Note (Signed)
Recommend reaching back out to her sleep specialist and DME company to get a CPAP machine that will work for her. Discussed potential health consequences associated with untreated sleep apnea.

## 2023-04-07 NOTE — Assessment & Plan Note (Signed)
She is no longer seen [facility].  She is still on Trulicity and states it curbs her appetite some days.  Unable to switch her to a different GLP-1 for weight loss due to history of pancreatitis.  Continue close follow-up with endocrinology.

## 2023-04-07 NOTE — Assessment & Plan Note (Signed)
BP fairly well-controlled.  Switched her to valsartan which seems to be working better.  Discussed the importance of good compliance with medication. Work on healthy, lower sodium diet. She has a BP machine and may bring it in for a nurse visit so we can help her learn how to use it.  Continue follow up with specialists. Follow up here in 6 months or sooner if HTN is not well-controlled.

## 2023-04-07 NOTE — Patient Instructions (Addendum)
I recommend taking your blood pressure medication, valsartan, daily.  Keeping your blood pressure under control is very important for your heart and overall health.   Monitor your blood pressure at home.  If you are seeing readings consistently higher than 130/80, let me know.  I recommend that you contact [facility] and find a DME company to get your CPAP.  Untreated sleep apnea can lead to several different health consequences.  Please do the Cologuard test and return it for colon cancer screening.  It appears that you are overdue for a diabetic eye exam.  I recommend that you call and schedule this.

## 2023-04-07 NOTE — Assessment & Plan Note (Signed)
She is now under the care of a psychiatrist and counselor through [facility].   Her mood is good.

## 2023-04-07 NOTE — Assessment & Plan Note (Signed)
Diabetes is improving since working with endocrinology.  Encouraged good medication compliance and healthy diet.

## 2023-04-07 NOTE — Progress Notes (Signed)
Subjective:     Patient ID: April Gonzales, female    DOB: 11-Dec-1975, 47 y.o.   MRN: [id number]  Chief Complaint  Patient presents with   Medical Management of Chronic Issues    3 month f/u    HPI  History of Present Illness         Here for follow up on chronic health conditions.   Lives in [place] with her father now.   She sees Ronny Bacon, NP at [facility] for her diabetes.   HTN- states she was out of her BP medication for 2 weeks. She just started back on valsartan 2 days ago.  She has not been checking her blood pressure at home.  States she has a blood pressure cuff that she cannot figure out how to use.  She is not on statin therapy due to lipids being well-controlled.  Last LDL 42 not on medication.  She was seeing Podiatry for diabetic ulcer of great right toe which has healed and will go for routine visits in the future.   Under the care of Dr. Vickey Huger for OSA.  She is describing an issue with the CPAP machine and not being able to tolerate it.  She was advised to switch masks and potentially DME companies but she has not done so since October. Has a bed that the head is adjustable.   [facility]- psychiatrist and therapist   [facility] in 2023  Pap smear UTD per patient.  Mammogram   Wants to do Cologuard and has the kit. Declines colonoscopy.    Health Maintenance Due  Topic Date Due   OPHTHALMOLOGY EXAM  Never done   HIV Screening  Never done   Hepatitis C Screening  Never done   Cervical Cancer Screening (HPV/Pap Cotest)  Never done   Colonoscopy  Never done   Diabetic kidney evaluation - Urine ACR  02/27/2023    Past Medical History:  Diagnosis Date   Anemia    Anxiety    Arthritis    B12 deficiency    Back pain    Celiac disease    Chronic fatigue syndrome    Constipation    Depression    DM (diabetes mellitus) (HCC)    Gallbladder problem    GERD (gastroesophageal reflux disease)    Heartburn    History of  kidney stones    Hypertension    Joint pain    Pancreatitis    Psoriasis    SOBOE (shortness of breath on exertion)    Vitamin D deficiency     Past Surgical History:  Procedure Laterality Date   CHOLECYSTECTOMY     CYSTOSCOPY/URETEROSCOPY/HOLMIUM LASER/STENT PLACEMENT Right 09/21/2022   Procedure: CYSTOSCOPY RIGHT URETEROSCOPY/HOLMIUM LASER/STENT PLACEMENT;  Surgeon: [name], MD;  Location: [facility];  Service: Urology;  Laterality: Right;  75 MINS FOR CASE   WISDOM TOOTH EXTRACTION      Family History  Problem Relation Age of Onset   Hyperlipidemia Mother    Diabetes Mother    Diabetes Mellitus II Mother    Hypertension Mother    Obesity Mother    Obesity Father    Hypertension Father    CAD Father    Chronic Renal Failure Father    Hyperlipidemia Father    Heart disease Father    Kidney disease Father    Alcoholism Father     Social History   Socioeconomic History   Marital status: Single    Spouse name:  Not on file   Number of children: Not on file   Years of education: Not on file   Highest education level: Associate degree: academic program  Occupational History   Occupation: Currently on leave of absence  Tobacco Use   Smoking status: Never   Smokeless tobacco: Never  Vaping Use   Vaping status: Never Used  Substance and Sexual Activity   Alcohol use: No   Drug use: No   Sexual activity: Not on file  Other Topics Concern   Not on file  Social History Narrative   Not on file   Social Drivers of Health   Financial Resource Strain: Medium Risk (03/26/2023)   Overall Financial Resource Strain (CARDIA)    Difficulty of Paying Living Expenses: Somewhat hard  Food Insecurity: Food Insecurity Present (03/26/2023)   Hunger Vital Sign    Worried About Running Out of Food in the Last Year: Sometimes true    Ran Out of Food in the Last Year: Sometimes true  Transportation Needs: No Transportation Needs (03/26/2023)   PRAPARE - Doctor, general practice (Medical): No    Lack of Transportation (Non-Medical): No  Physical Activity: Insufficiently Active (03/26/2023)   Exercise Vital Sign    Days of Exercise per Week: 1 day    Minutes of Exercise per Session: 10 min  Stress: Stress Concern Present (03/26/2023)   [employer] of Occupational Health - Occupational Stress Questionnaire    Feeling of Stress : To some extent  Social Connections: Socially Isolated (03/26/2023)   Social Connection and Isolation Panel [NHANES]    Frequency of Communication with Friends and Family: Three times a week    Frequency of Social Gatherings with Friends and Family: Once a week    Attends Religious Services: Never    Database administrator or Organizations: No    Attends Engineer, structural: Not on file    Marital Status: Never married  Intimate Partner Violence: Not on file    Outpatient Medications Prior to Visit  Medication Sig Dispense Refill   busPIRone (BUSPAR) 5 MG tablet Take 5 mg by mouth 2 (two) times daily.     Continuous Glucose Sensor (DEXCOM G7 SENSOR) MISC Inject 1 Application into the skin as directed. Change sensor every 10 days as directed. 9 each 3   Dulaglutide (TRULICITY) 1.5 MG/0.5ML SOAJ Inject 1.5 mg into the skin once a week. 2 mL 0   escitalopram (LEXAPRO) 20 MG tablet Take 1 tablet (20 mg total) by mouth daily. 30 tablet 2   glipiZIDE (GLUCOTROL XL) 5 MG 24 hr tablet TAKE 1 TABLET BY MOUTH EVERY DAY WITH BREAKFAST 90 tablet 1   hydrocortisone-pramoxine (PROCTOFOAM-HC) rectal foam Place 1 applicator rectally 2 (two) times daily. 10 g 1   Insulin Glargine (BASAGLAR KWIKPEN) 100 UNIT/ML INJECT 30 UNITS INTO THE SKIN AT BEDTIME. 30 mL 0   Insulin Pen Needle (PEN NEEDLES) 31G X 8 MM MISC Use to inject insulin once daily 100 each 3   metFORMIN (GLUCOPHAGE) 1000 MG tablet TAKE 1 TABLET (1,000 MG TOTAL) BY MOUTH TWICE A DAY WITH FOOD 180 tablet 1   pregabalin (LYRICA) 75 MG capsule Take 75 mg by mouth 2  (two) times daily as needed (pain).     Risankizumab-rzaa (SKYRIZI) 150 MG/ML SOSY Inject 150 mg into the skin every 3 (three) months.     Tapinarof (VTAMA) 1 % CREA Apply 1 Application topically daily as needed (psoriasis).  triamcinolone cream (KENALOG) 0.1 % Apply 1 application topically 2 (two) times daily as needed (psoriasis).      valsartan (DIOVAN) 160 MG tablet TAKE 1 TABLET(160 MG) BY MOUTH DAILY 90 tablet 1   Vitamin D, Ergocalciferol, (DRISDOL) 1.25 MG (50000 UNIT) CAPS capsule TAKE 1 CAPSULE (50,000 UNITS TOTAL) BY MOUTH once weekly 12 capsule 3   No facility-administered medications prior to visit.    Allergies  Allergen Reactions   Dilaudid [Hydromorphone Hcl] Nausea And Vomiting   Hydromorphone     Other reaction(s): Unknown   Methotrexate Other (See Comments)    Childhood reaction, foaming at the mouth    Review of Systems  Constitutional:  Negative for chills, fever, malaise/fatigue and weight loss.  Respiratory:  Negative for shortness of breath.   Cardiovascular:  Negative for chest pain, palpitations and leg swelling.  Gastrointestinal:  Negative for abdominal pain, constipation, diarrhea, nausea and vomiting.  Genitourinary:  Negative for dysuria, frequency and urgency.  Musculoskeletal:  Positive for back pain and joint pain.  Neurological:  Negative for dizziness and focal weakness.  Psychiatric/Behavioral:  Negative for depression. The patient is not nervous/anxious.        Objective:    Physical Exam Constitutional:      General: She is not in acute distress.    Appearance: She is obese. She is not ill-appearing.  Eyes:     Extraocular Movements: Extraocular movements intact.     Conjunctiva/sclera: Conjunctivae normal.  Cardiovascular:     Rate and Rhythm: Normal rate.  Pulmonary:     Effort: Pulmonary effort is normal.  Musculoskeletal:     Cervical back: Normal range of motion and neck supple.  Skin:    General: Skin is warm and dry.   Neurological:     General: No focal deficit present.     Mental Status: She is alert and oriented to person, place, and time.  Psychiatric:        Mood and Affect: Mood normal.        Behavior: Behavior normal.        Thought Content: Thought content normal.      BP 134/82 (BP Location: Left Arm, Patient Position: Sitting, Cuff Size: Large)   Pulse 86   Temp 97.6 F (36.4 C) (Temporal)   Ht 5\' 5"  (1.651 m)   Wt (!) 303 lb (137.4 kg)   SpO2 99%   BMI 50.42 kg/m  Wt Readings from Last 3 Encounters:  04/07/23 (!) 303 lb (137.4 kg)  02/09/23 (!) 301 lb 12.8 oz (136.9 kg)  02/09/23 (!) 301 lb 12.8 oz (136.9 kg)       Assessment & Plan:   Problem List Items Addressed This Visit     Anxiety and depression   She is now under the care of a psychiatrist and counselor through [facility].   Her mood is good.      Essential hypertension - Primary   BP fairly well-controlled.  Switched her to valsartan which seems to be working better.  Discussed the importance of good compliance with medication. Work on healthy, lower sodium diet. She has a BP machine and may bring it in for a nurse visit so we can help her learn how to use it.  Continue follow up with specialists. Follow up here in 6 months or sooner if HTN is not well-controlled.      Morbid obesity (HCC)   She is no longer seen [facility].  She is  still on Trulicity and states it curbs her appetite some days.  Unable to switch her to a different GLP-1 for weight loss due to history of pancreatitis.  Continue close follow-up with endocrinology.      OSA (obstructive sleep apnea)   Recommend reaching back out to her sleep specialist and DME company to get a CPAP machine that will work for her. Discussed potential health consequences associated with untreated sleep apnea.      Uncontrolled type 2 diabetes mellitus with hyperglycemia (HCC)   Diabetes is improving since working with endocrinology.  Encouraged  good medication compliance and healthy diet.      I will request her Pap smear from last year. Encouraged her to do the Cologuard test for colon cancer screening.  She declines colonoscopy referral.  I am having Raynelle Fanning A. Koller maintain her triamcinolone cream, hydrocortisone-pramoxine, escitalopram, Skyrizi, busPIRone, pregabalin, Vtama, Pen Needles, Dexcom G7 Sensor, glipiZIDE, metFORMIN, Trulicity, Vitamin D (Ergocalciferol), Basaglar KwikPen, and valsartan.  No orders of the defined types were placed in this encounter.

## 2023-04-25 ENCOUNTER — Ambulatory Visit: Payer: Managed Care, Other (non HMO) | Admitting: Podiatry

## 2023-04-27 ENCOUNTER — Encounter: Payer: Self-pay | Admitting: Nurse Practitioner

## 2023-04-27 ENCOUNTER — Other Ambulatory Visit: Payer: Self-pay | Admitting: Nurse Practitioner

## 2023-04-27 DIAGNOSIS — E1169 Type 2 diabetes mellitus with other specified complication: Secondary | ICD-10-CM

## 2023-04-27 MED ORDER — TRULICITY 1.5 MG/0.5ML ~~LOC~~ SOAJ: 1.5000 mg | SUBCUTANEOUS | 0 refills | Status: DC

## 2023-04-28 ENCOUNTER — Other Ambulatory Visit (HOSPITAL_COMMUNITY)
Admission: RE | Admit: 2023-04-28 | Discharge: 2023-04-28 | Disposition: A | Discharge status: Home / Self Care | Payer: Commercial Managed Care - HMO | Source: Ambulatory Visit | Attending: Nurse Practitioner | Admitting: Nurse Practitioner

## 2023-04-28 DIAGNOSIS — E1165 Type 2 diabetes mellitus with hyperglycemia: Secondary | ICD-10-CM | POA: Diagnosis not present

## 2023-04-28 DIAGNOSIS — R6889 Other general symptoms and signs: Secondary | ICD-10-CM | POA: Diagnosis present

## 2023-04-28 LAB — COMPREHENSIVE METABOLIC PANEL
ALT: 32 U/L (ref 0–44)
AST: 26 U/L (ref 15–41)
Albumin: 3.2 g/dL — ABNORMAL LOW (ref 3.5–5.0)
Alkaline Phosphatase: 100 U/L (ref 38–126)
Anion gap: 10 (ref 5–15)
BUN: 16 mg/dL (ref 6–20)
CO2: 24 mmol/L (ref 22–32)
Calcium: 8.8 mg/dL — ABNORMAL LOW (ref 8.9–10.3)
Chloride: 98 mmol/L (ref 98–111)
Creatinine, Ser: 0.75 mg/dL (ref 0.44–1.00)
GFR, Estimated: >60 mL/min (ref >60)
Glucose, Bld: 259 mg/dL — ABNORMAL HIGH (ref 70–99)
Potassium: 4.5 mmol/L (ref 3.5–5.1)
Sodium: 132 mmol/L — ABNORMAL LOW (ref 135–145)
Total Bilirubin: 0.5 mg/dL (ref 0.0–1.2)
Total Protein: 7.7 g/dL (ref 6.5–8.1)

## 2023-04-28 LAB — CORTISOL-AM, BLOOD: Cortisol - AM: 12.1 ug/dL (ref 6.7–22.6)

## 2023-04-28 LAB — T4, FREE: Free T4: 0.94 ng/dL (ref 0.61–1.12)

## 2023-04-28 LAB — COLOGUARD: COLOGUARD: NEGATIVE

## 2023-04-28 LAB — TSH: TSH: 3.631 u[IU]/mL (ref 0.350–4.500)

## 2023-04-29 LAB — THYROGLOBULIN ANTIBODY: Thyroglobulin Antibody: <1 [IU]/mL (ref 0.0–0.9)

## 2023-04-30 LAB — T3, FREE: T3, Free: 3.6 pg/mL (ref 2.0–4.4)

## 2023-04-30 LAB — THYROID PEROXIDASE ANTIBODY: Thyroperoxidase Ab SerPl-aCnc: 10 [IU]/mL (ref 0–34)

## 2023-05-02 ENCOUNTER — Telehealth: Payer: Self-pay

## 2023-05-02 ENCOUNTER — Other Ambulatory Visit (HOSPITAL_BASED_OUTPATIENT_CLINIC_OR_DEPARTMENT_OTHER): Payer: Self-pay

## 2023-05-02 ENCOUNTER — Other Ambulatory Visit (HOSPITAL_COMMUNITY): Payer: Self-pay

## 2023-05-02 NOTE — Telephone Encounter (Signed)
 Pharmacy Patient Advocate Encounter   Received notification from Patient Advice Request messages that prior authorization for Dexcom G7 sensor is required/requested.   Insurance verification completed.   The patient is insured through [address] and [employer].   Per test claim: The current 30 day co-pay is, $0.  No PA needed at this time. This test claim was processed through [facility]- copay amounts may vary at other pharmacies due to pharmacy/plan contracts, or as the patient moves through the different stages of their insurance plan.

## 2023-05-02 NOTE — Telephone Encounter (Signed)
 Noted and the patient was called and made aware.She will let us know if we need to call in a prescription for her.

## 2023-05-12 ENCOUNTER — Other Ambulatory Visit (HOSPITAL_BASED_OUTPATIENT_CLINIC_OR_DEPARTMENT_OTHER): Payer: Self-pay

## 2023-05-16 ENCOUNTER — Encounter: Payer: Self-pay | Admitting: Podiatry

## 2023-05-16 ENCOUNTER — Ambulatory Visit (INDEPENDENT_AMBULATORY_CARE_PROVIDER_SITE_OTHER): Payer: Medicaid Other | Admitting: Podiatry

## 2023-05-16 DIAGNOSIS — E1169 Type 2 diabetes mellitus with other specified complication: Secondary | ICD-10-CM | POA: Diagnosis not present

## 2023-05-16 DIAGNOSIS — M79675 Pain in left toe(s): Secondary | ICD-10-CM | POA: Diagnosis not present

## 2023-05-16 DIAGNOSIS — M79674 Pain in right toe(s): Secondary | ICD-10-CM | POA: Diagnosis not present

## 2023-05-16 DIAGNOSIS — B351 Tinea unguium: Secondary | ICD-10-CM

## 2023-05-16 NOTE — Progress Notes (Signed)
  Subjective:  Patient ID: April Gonzales, female    DOB: 1975-09-13,   MRN: [id number]  Chief Complaint  Patient presents with   Nail Problem    DFC    48 y.o. female presents for concern of thickened elongated and painful nails that are difficult to trim. Requesting to have them trimmed today. Relates burning and tingling in their feet. Patient is diabetic and last A1c was  Lab Results  Component Value Date   HGBA1C 8.1 (A) 02/09/2023   .   PCP:  Avanell Shackleton, NP-C    . Denies any other pedal complaints. Denies n/v/f/c.   Past Medical History:  Diagnosis Date   Anemia    Anxiety    Arthritis    B12 deficiency    Back pain    Celiac disease    Chronic fatigue syndrome    Constipation    Depression    DM (diabetes mellitus) (HCC)    Gallbladder problem    GERD (gastroesophageal reflux disease)    Heartburn    History of kidney stones    Hypertension    Joint pain    Pancreatitis    Psoriasis    SOBOE (shortness of breath on exertion)    Vitamin D deficiency     Objective:  Physical Exam: Vascular: DP/PT pulses 2/4 bilateral. CFT <3 seconds. Absent hair growth on digits. Edema noted to bilateral lower extremities. Xerosis noted bilaterally.  Skin. No lacerations or abrasions bilateral feet. Nails 1-5 bilateral  are thickened discolored and elongated with subungual debris. Hyperkeratosis mildly on plantar right hallux and wound remains healed.  Musculoskeletal: MMT 5/5 bilateral lower extremities in DF, PF, Inversion and Eversion. Deceased ROM in DF of ankle joint.  Neurological: Sensation intact to light touch. Protective sensation diminished bilateral.    Assessment:   1. Pain due to onychomycosis of toenails of both feet   2. Type 2 diabetes mellitus with other specified complication, without long-term current use of insulin (HCC)       Plan:  Patient was evaluated and treated and all questions answered. -Right hallux wound well healed.  -Discussed and  educated patient on diabetic foot care, especially with  regards to the vascular, neurological and musculoskeletal systems.  -Stressed the importance of good glycemic control and the detriment of not  controlling glucose levels in relation to the foot. -Discussed supportive shoes at all times and checking feet regularly.  -Mechanically debrided all nails 1-5 bilateral using sterile nail nipper and filed with dremel without incident  -Answered all patient questions -Patient to return  in 3 months for at risk foot care -Patient advised to call the office if any problems or questions arise in the meantime.   Louann Sjogren, DPM

## 2023-05-17 ENCOUNTER — Ambulatory Visit: Payer: Managed Care, Other (non HMO) | Admitting: Nurse Practitioner

## 2023-05-17 ENCOUNTER — Ambulatory Visit: Payer: Managed Care, Other (non HMO) | Admitting: Nutrition

## 2023-05-17 DIAGNOSIS — E559 Vitamin D deficiency, unspecified: Secondary | ICD-10-CM

## 2023-05-17 DIAGNOSIS — Z7984 Long term (current) use of oral hypoglycemic drugs: Secondary | ICD-10-CM

## 2023-05-17 DIAGNOSIS — E1165 Type 2 diabetes mellitus with hyperglycemia: Secondary | ICD-10-CM

## 2023-05-17 DIAGNOSIS — Z7985 Long-term (current) use of injectable non-insulin antidiabetic drugs: Secondary | ICD-10-CM

## 2023-05-17 DIAGNOSIS — I10 Essential (primary) hypertension: Secondary | ICD-10-CM

## 2023-05-26 ENCOUNTER — Ambulatory Visit: Payer: Self-pay | Admitting: Nutrition

## 2023-06-23 ENCOUNTER — Encounter: Payer: Self-pay | Admitting: Nurse Practitioner

## 2023-06-23 ENCOUNTER — Encounter: Payer: Self-pay | Attending: Family Medicine | Admitting: Nutrition

## 2023-06-23 ENCOUNTER — Ambulatory Visit: Payer: Managed Care, Other (non HMO) | Admitting: Nurse Practitioner

## 2023-06-23 VITALS — BP 136/80 | HR 85 | Ht 65.0 in | Wt 304.8 lb

## 2023-06-23 DIAGNOSIS — I10 Essential (primary) hypertension: Secondary | ICD-10-CM | POA: Insufficient documentation

## 2023-06-23 DIAGNOSIS — E559 Vitamin D deficiency, unspecified: Secondary | ICD-10-CM

## 2023-06-23 DIAGNOSIS — E1169 Type 2 diabetes mellitus with other specified complication: Secondary | ICD-10-CM

## 2023-06-23 DIAGNOSIS — Z7984 Long term (current) use of oral hypoglycemic drugs: Secondary | ICD-10-CM

## 2023-06-23 DIAGNOSIS — E119 Type 2 diabetes mellitus without complications: Secondary | ICD-10-CM | POA: Diagnosis present

## 2023-06-23 DIAGNOSIS — Z7985 Long-term (current) use of injectable non-insulin antidiabetic drugs: Secondary | ICD-10-CM

## 2023-06-23 DIAGNOSIS — I152 Hypertension secondary to endocrine disorders: Secondary | ICD-10-CM | POA: Diagnosis present

## 2023-06-23 DIAGNOSIS — Z6841 Body Mass Index (BMI) 40.0 and over, adult: Secondary | ICD-10-CM | POA: Insufficient documentation

## 2023-06-23 DIAGNOSIS — E1165 Type 2 diabetes mellitus with hyperglycemia: Secondary | ICD-10-CM

## 2023-06-23 DIAGNOSIS — E1159 Type 2 diabetes mellitus with other circulatory complications: Secondary | ICD-10-CM | POA: Insufficient documentation

## 2023-06-23 DIAGNOSIS — R7989 Other specified abnormal findings of blood chemistry: Secondary | ICD-10-CM

## 2023-06-23 LAB — POCT GLYCOSYLATED HEMOGLOBIN (HGB A1C): Hemoglobin A1C: 11 % — AB (ref 4.0–5.6)

## 2023-06-23 MED ORDER — PEN NEEDLES 31G X 8 MM MISC
3 refills | Status: DC
Start: 2023-06-23 — End: 2024-01-03

## 2023-06-23 MED ORDER — METFORMIN HCL 1000 MG PO TABS: 1000.0000 mg | ORAL_TABLET | Freq: Two times a day (BID) | ORAL | 1 refills | Status: DC

## 2023-06-23 MED ORDER — BASAGLAR KWIKPEN 100 UNIT/ML ~~LOC~~ SOPN: 30.0000 [IU] | PEN_INJECTOR | Freq: Every day | SUBCUTANEOUS | 3 refills | Status: DC

## 2023-06-23 MED ORDER — GLIPIZIDE ER 5 MG PO TB24: 5.0000 mg | ORAL_TABLET | Freq: Every day | ORAL | 3 refills | Status: DC

## 2023-06-23 MED ORDER — INSULIN ASPART 100 UNIT/ML IJ SOLN
6.0000 [IU] | Freq: Three times a day (TID) | INTRAMUSCULAR | 3 refills | Status: DC
Start: 2023-06-23 — End: 2023-07-05

## 2023-06-23 MED ORDER — TRULICITY 1.5 MG/0.5ML ~~LOC~~ SOAJ: 1.5000 mg | SUBCUTANEOUS | 1 refills | Status: DC

## 2023-06-23 MED ORDER — DEXCOM G7 SENSOR MISC: 1.0000 | 3 refills | Status: DC

## 2023-06-23 NOTE — Patient Instructions (Addendum)
 Goals Avoid pork and beef and only eat chicken, fish or [place] for animal meat Increase fresh fruits and vegetables. Watch portions Choose rotisserie chicken instea of lunch meal Take insulin before meals and use sliding scale 6 units of Novolog with meals and Basaglar 30 units .

## 2023-06-23 NOTE — Progress Notes (Signed)
 Endocrinology Follow Up Note       06/23/2023, 4:31 PM   Subjective:    Patient ID: April Gonzales, female    DOB: [birth year].  April Gonzales is being seen in follow up after being seen in consultation for management of currently uncontrolled symptomatic diabetes requested by  Hetty Blend L, NP-C.   Past Medical History:  Diagnosis Date   Anemia    Anxiety    Arthritis    B12 deficiency    Back pain    Celiac disease    Chronic fatigue syndrome    Constipation    Depression    DM (diabetes mellitus) (HCC)    Gallbladder problem    GERD (gastroesophageal reflux disease)    Heartburn    History of kidney stones    Hypertension    Joint pain    Pancreatitis    Psoriasis    SOBOE (shortness of breath on exertion)    Vitamin D deficiency     Past Surgical History:  Procedure Laterality Date   CHOLECYSTECTOMY     CYSTOSCOPY/URETEROSCOPY/HOLMIUM LASER/STENT PLACEMENT Right 09/21/2022   Procedure: CYSTOSCOPY RIGHT URETEROSCOPY/HOLMIUM LASER/STENT PLACEMENT;  Surgeon: [name], MD;  Location: [facility];  Service: Urology;  Laterality: Right;  75 MINS FOR CASE   WISDOM TOOTH EXTRACTION      Social History   Socioeconomic History   Marital status: Single    Spouse name: Not on file   Number of children: Not on file   Years of education: Not on file   Highest education level: Associate degree: academic program  Occupational History   Occupation: Currently on leave of absence  Tobacco Use   Smoking status: Never   Smokeless tobacco: Never  Vaping Use   Vaping status: Never Used  Substance and Sexual Activity   Alcohol use: No   Drug use: No   Sexual activity: Not on file  Other Topics Concern   Not on file  Social History Narrative   Not on file   Social Drivers of Health   Financial Resource Strain: Medium Risk (03/26/2023)   Overall Financial Resource Strain (CARDIA)    Difficulty  of Paying Living Expenses: Somewhat hard  Food Insecurity: Food Insecurity Present (03/26/2023)   Hunger Vital Sign    Worried About Running Out of Food in the Last Year: Sometimes true    Ran Out of Food in the Last Year: Sometimes true  Transportation Needs: No Transportation Needs (03/26/2023)   PRAPARE - Administrator, Civil Service (Medical): No    Lack of Transportation (Non-Medical): No  Physical Activity: Insufficiently Active (03/26/2023)   Exercise Vital Sign    Days of Exercise per Week: 1 day    Minutes of Exercise per Session: 10 min  Stress: Stress Concern Present (03/26/2023)   [employer] of Occupational Health - Occupational Stress Questionnaire    Feeling of Stress : To some extent  Social Connections: Socially Isolated (03/26/2023)   Social Connection and Isolation Panel [NHANES]    Frequency of Communication with Friends and Family: Three times a week    Frequency of Social Gatherings with Friends and Family: Once a week  Attends Religious Services: Never    Active Member of Clubs or Organizations: No    Attends Engineer, structural: Not on file    Marital Status: Never married    Family History  Problem Relation Age of Onset   Hyperlipidemia Mother    Diabetes Mother    Diabetes Mellitus II Mother    Hypertension Mother    Obesity Mother    Obesity Father    Hypertension Father    CAD Father    Chronic Renal Failure Father    Hyperlipidemia Father    Heart disease Father    Kidney disease Father    Alcoholism Father     Outpatient Encounter Medications as of 06/23/2023  Medication Sig   busPIRone (BUSPAR) 5 MG tablet Take 5 mg by mouth 2 (two) times daily.   escitalopram (LEXAPRO) 20 MG tablet Take 1 tablet (20 mg total) by mouth daily.   hydrocortisone-pramoxine (PROCTOFOAM-HC) rectal foam Place 1 applicator rectally 2 (two) times daily.   insulin aspart (NOVOLOG) 100 UNIT/ML injection Inject 6-12 Units into the skin 3  (three) times daily with meals.   pregabalin (LYRICA) 75 MG capsule Take 75 mg by mouth 2 (two) times daily as needed (pain).   Risankizumab-rzaa (SKYRIZI) 150 MG/ML SOSY Inject 150 mg into the skin every 3 (three) months.   Tapinarof (VTAMA) 1 % CREA Apply 1 Application topically daily as needed (psoriasis).   triamcinolone cream (KENALOG) 0.1 % Apply 1 application topically 2 (two) times daily as needed (psoriasis).    valsartan (DIOVAN) 160 MG tablet TAKE 1 TABLET(160 MG) BY MOUTH DAILY   Vitamin D, Ergocalciferol, (DRISDOL) 1.25 MG (50000 UNIT) CAPS capsule TAKE 1 CAPSULE (50,000 UNITS TOTAL) BY MOUTH once weekly   [DISCONTINUED] Continuous Glucose Sensor (DEXCOM G7 SENSOR) MISC Inject 1 Application into the skin as directed. Change sensor every 10 days as directed.   [DISCONTINUED] Dulaglutide (TRULICITY) 1.5 MG/0.5ML SOAJ Inject 1.5 mg into the skin once a week.   [DISCONTINUED] glipiZIDE (GLUCOTROL XL) 5 MG 24 hr tablet TAKE 1 TABLET BY MOUTH EVERY DAY WITH BREAKFAST   [DISCONTINUED] Insulin Glargine (BASAGLAR KWIKPEN) 100 UNIT/ML INJECT 30 UNITS INTO THE SKIN AT BEDTIME.   [DISCONTINUED] Insulin Pen Needle (PEN NEEDLES) 31G X 8 MM MISC Use to inject insulin once daily   [DISCONTINUED] metFORMIN (GLUCOPHAGE) 1000 MG tablet TAKE 1 TABLET (1,000 MG TOTAL) BY MOUTH TWICE A DAY WITH FOOD   Continuous Glucose Sensor (DEXCOM G7 SENSOR) MISC Inject 1 Application into the skin as directed. Change sensor every 10 days as directed.   Dulaglutide (TRULICITY) 1.5 MG/0.5ML SOAJ Inject 1.5 mg into the skin once a week.   glipiZIDE (GLUCOTROL XL) 5 MG 24 hr tablet Take 1 tablet (5 mg total) by mouth daily with breakfast.   Insulin Glargine (BASAGLAR KWIKPEN) 100 UNIT/ML Inject 30 Units into the skin at bedtime.   Insulin Pen Needle (PEN NEEDLES) 31G X 8 MM MISC Use to inject insulin 4 times daily   metFORMIN (GLUCOPHAGE) 1000 MG tablet Take 1 tablet (1,000 mg total) by mouth 2 (two) times daily with a  meal.   No facility-administered encounter medications on file as of 06/23/2023.    ALLERGIES: Allergies  Allergen Reactions   Dilaudid [Hydromorphone Hcl] Nausea And Vomiting   Hydromorphone     Other reaction(s): Unknown   Methotrexate Other (See Comments)    Childhood reaction, foaming at the mouth    VACCINATION STATUS: Immunization History  Administered Date(s) Administered  Influenza, Seasonal, Injecte, Preservative Fre 12/29/2022   Influenza,inj,Quad PF,6+ Mos 12/24/2021   Tdap 10/11/2020    Diabetes She presents for her follow-up diabetic visit. She has type 2 diabetes mellitus. Onset time: formally diagnosed at age 22. Her disease course has been worsening. There are no hypoglycemic associated symptoms. Associated symptoms include fatigue, polydipsia and polyuria. Pertinent negatives for diabetes include no weight loss. There are no hypoglycemic complications. Symptoms are stable. There are no diabetic complications. Risk factors for coronary artery disease include diabetes mellitus, dyslipidemia, obesity, hypertension and sedentary lifestyle. Current diabetic treatment includes oral agent (triple therapy) and insulin injections (and Trulicity). She is compliant with treatment most of the time (ran out of insulin 2 weeks ago). Her weight is stable. She is following a generally unhealthy diet. When asked about meal planning, she reported none. She has not had a previous visit with a dietitian. She rarely participates in exercise. Her home blood glucose trend is fluctuating minimally. Her overall blood glucose range is >200 mg/dl. (She presents today with her CGM showing gross hyperglycemia overall.  Her POCT A1c today is 11%, increasing from last visit of 8.1%.  Analysis of her CGM shows TIR 1%, TAR 99%, TBR 0%.  She did bring food log with her which is showing lots of processed and high fat foods.) An ACE inhibitor/angiotensin II receptor blocker is being taken. She does not see a  podiatrist.Eye exam is not current.     Review of systems  Constitutional: + Minimally fluctuating body weight, current Body mass index is 50.72 kg/m., no fatigue, no subjective hyperthermia, no subjective hypothermia Eyes: no blurry vision, no xerophthalmia ENT: no sore throat, no nodules palpated in throat, no dysphagia/odynophagia, no hoarseness Cardiovascular: no chest pain, no shortness of breath, no palpitations, no leg swelling Respiratory: no cough, no shortness of breath Gastrointestinal: no nausea/vomiting/diarrhea Musculoskeletal: no muscle/joint aches Skin: + dry flaky skin to BLE, + hyperemia to BLE Neurological: no tremors, no numbness, no tingling, no dizziness Psychiatric: no depression, no anxiety  Objective:     BP 136/80 (BP Location: Left Arm, Patient Position: Sitting, Cuff Size: Large)   Pulse 85   Ht 5\' 5"  (1.651 m)   Wt (!) 304 lb 12.8 oz (138.3 kg)   BMI 50.72 kg/m   Wt Readings from Last 3 Encounters:  06/23/23 (!) 304 lb 12.8 oz (138.3 kg)  04/07/23 (!) 303 lb (137.4 kg)  02/09/23 (!) 301 lb 12.8 oz (136.9 kg)     BP Readings from Last 3 Encounters:  06/23/23 136/80  04/07/23 134/82  02/09/23 132/74     Physical Exam- Limited  Constitutional:  Body mass index is 50.72 kg/m. , not in acute distress, normal state of mind, + cushingoid appearance with dorsocervical fat pad present and moon face Eyes:  EOMI, no exophthalmos Musculoskeletal: no gross deformities, strength intact in all four extremities, no gross restriction of joint movements Skin:  + dry flaky skin to BLE, + hyperemia to BLE Neurological: no tremor with outstretched hands   Diabetic Foot Exam - Simple   No data filed     CMP ( most recent) CMP     Component Value Date/Time   NA 132 (L) 04/28/2023 0900   K 4.5 04/28/2023 0900   CL 98 04/28/2023 0900   CO2 24 04/28/2023 0900   GLUCOSE 259 (H) 04/28/2023 0900   BUN 16 04/28/2023 0900   CREATININE 0.75 04/28/2023 0900    CALCIUM 8.8 (L) 04/28/2023 0900  PROT 7.7 04/28/2023 0900   ALBUMIN 3.2 (L) 04/28/2023 0900   AST 26 04/28/2023 0900   ALT 32 04/28/2023 0900   ALKPHOS 100 04/28/2023 0900   BILITOT 0.5 04/28/2023 0900   GFRNONAA >60 04/28/2023 0900   GFRAA >60 04/06/2019 1242     Diabetic Labs (most recent): Lab Results  Component Value Date   HGBA1C 11.0 (A) 06/23/2023   HGBA1C 8.1 (A) 02/09/2023   HGBA1C 10.8 (H) 09/08/2022   MICROALBUR 7.9 (H) 02/26/2022     Lipid Panel ( most recent) Lipid Panel     Component Value Date/Time   CHOL 105 12/24/2021 1052   TRIG 119.0 12/24/2021 1052   HDL 38.60 (L) 12/24/2021 1052   CHOLHDL 3 12/24/2021 1052   VLDL 23.8 12/24/2021 1052   LDLCALC 42 12/24/2021 1052      Lab Results  Component Value Date   TSH 3.631 04/28/2023   TSH 1.94 05/27/2022   TSH 2.11 02/26/2022   TSH 1.95 11/19/2021   FREET4 0.94 04/28/2023   FREET4 1.10 11/19/2021           Assessment & Plan:   1) Type 2 diabetes mellitus with hyperglycemia, without long-term current use of insulin  She presents today with her CGM showing gross hyperglycemia overall.  Her POCT A1c today is 11%, increasing from last visit of 8.1%.  Analysis of her CGM shows TIR 1%, TAR 99%, TBR 0%.  She did bring food log with her which is showing lots of processed and high fat foods.  - April Gonzales has currently uncontrolled symptomatic type 2 DM since 48 years of age.   -Recent labs reviewed.  - I had a long discussion with her about the progressive nature of diabetes and the pathology behind its complications. -her diabetes is complicated by pancreatitis (on 2 separate occasions) and she remains at a high risk for more acute and chronic complications which include CAD, CVA, CKD, retinopathy, and neuropathy. These are all discussed in detail with her.  The following Lifestyle Medicine recommendations according to American College of Lifestyle Medicine [facility]) were discussed and offered to  patient and she agrees to start the journey:  A. Whole Foods, Plant-based plate comprising of fruits and vegetables, plant-based proteins, whole-grain carbohydrates was discussed in detail with the patient.   A list for source of those nutrients were also provided to the patient.  Patient will use only water or unsweetened tea for hydration. B.  The need to stay away from risky substances including alcohol, smoking; obtaining 7 to 9 hours of restorative sleep, at least 150 minutes of moderate intensity exercise weekly, the importance of healthy social connections,  and stress reduction techniques were discussed. C.  A full color page of  Calorie density of various food groups per pound showing examples of each food groups was provided to the patient.  - Nutritional counseling repeated at each appointment due to patients tendency to fall back in to old habits.  - The patient admits there is a room for improvement in their diet and drink choices. -  Suggestion is made for the patient to avoid simple carbohydrates from their diet including Cakes, Sweet Desserts / Pastries, Ice Cream, Soda (diet and regular), Sweet Tea, Candies, Chips, Cookies, Sweet Pastries, Store Bought Juices, Alcohol in Excess of 1-2 drinks a day, Artificial Sweeteners, Coffee Creamer, and "Sugar-free" Products. This will help patient to have stable blood glucose profile and potentially avoid unintended weight gain.   - I encouraged  the patient to switch to unprocessed or minimally processed complex starch and increased protein intake (animal or plant source), fruits, and vegetables.   - Patient is advised to stick to a routine mealtimes to eat 3 meals a day and avoid unnecessary snacks (to snack only to correct hypoglycemia).  - she will be scheduled with Norm Salt, RDN, CDE for diabetes education.  - I have approached her with the following individualized plan to manage her diabetes and patient agrees:   - she is advised to  continue Metformin 1000 mg po twice daily with meals and Glipizide 5 mg XL po daily, therapeutically suitable for patient.  She can also continue Trulicity 1.5 mg SQ weekly (will not increase due to risk of pancreatitis).   She is advised to continue Basaglar 30 units SQ nightly.  Will start prandial insulin with Novolog 6-12 units TID with meals if glucose is above 90 and she is eating (Specific instructions on how to titrate insulin dosage based on glucose readings given to patient in writing).  She demonstrated her ability to properly use the SSI chart to dose meal time insulin with me today.  --she is encouraged to continue monitoring glucose 2 times daily (using her CGM), before breakfast and before bed, to and to call the clinic if she has readings less than 70 or above 300 for 3 tests in a row.  - Adjustment parameters are given to her for hypo and hyperglycemia in writing.  - her Marcelline Deist was previously discontinued, risk outweighs benefit for this patient- has had yeast and UTI infections since starting this medication.  - she is not an ideal candidate for incretin therapy given her history of pancreatitis on 2 separate occasions in the past.  However, she has been on Trulicity 1.5 mg SQ weekly and tolerating it well, therefore she can continue it for now.    - Specific targets for  A1c; LDL, HDL, and Triglycerides were discussed with the patient.  2) Blood Pressure /Hypertension:  her blood pressure is controlled to target.   she is advised to continue her current medications including Lisinopril 40 mg p.o. daily with breakfast.  3) Lipids/Hyperlipidemia:    Review of her recent lipid panel from 12/24/21 showed controlled LDL at 42 . She is not currently on any lipid lowering medications.  4)  Weight/Diet:  her Body mass index is 50.72 kg/m.  -  clearly complicating her diabetes care.   she is a candidate for weight loss. I discussed with her the fact that loss of 5 - 10% of her  current  body weight will have the most impact on her diabetes management.  Exercise, and detailed carbohydrates information provided  -  detailed on discharge instructions.  5) Chronic Care/Health Maintenance: -she is on ACEI/ARB and not on Statin medications and is encouraged to initiate and continue to follow up with Ophthalmology, Dentist, Podiatrist at least yearly or according to recommendations, and advised to stay away from smoking. I have recommended yearly flu vaccine and pneumonia vaccine at least every 5 years; moderate intensity exercise for up to 150 minutes weekly; and sleep for at least 7 hours a day.  6) Cushingoid appearance Will check am-cortisol and urine cortisol level to assess for possible cushings disease.  If labs return abnormal, she is aware that her care will be transferred to Dr. Fransico Him for further evaluation and treatment.  Cortisol level was normal, urine cortisol was not performed.  7) Vitamin D deficiency Her recent vitamin  D level was low at 19.2.  She has been on Ergocalciferol in the past.  I reinitiated it at 50000 units PO weekly with several refills for now.  - she is advised to maintain close follow up with Avanell Shackleton, NP-C for primary care needs, as well as her other providers for optimal and coordinated care.  8) Abnormal TSH  -Her thyroid antibodies were negative ruling out autoimmune thyroid dysfunction but her TSH and Free T4 were a tad closer to the under-active side.  In reviewing history of thyroid labs, she has a tendency to fluctuate.  Will recheck prior to next visit and if continues to be on the low side, may trial thyroid hormone replacement therapy.   I spent  46  minutes in the care of the patient today including review of labs from CMP, Lipids, Thyroid Function, Hematology (current and previous including abstractions from other facilities); face-to-face time discussing  her blood glucose readings/logs, discussing hypoglycemia and hyperglycemia  episodes and symptoms, medications doses, her options of short and long term treatment based on the latest standards of care / guidelines;  discussion about incorporating lifestyle medicine;  and documenting the encounter. Risk reduction counseling performed per USPSTF guidelines to reduce obesity and cardiovascular risk factors.     Please refer to Patient Instructions for Blood Glucose Monitoring and Insulin/Medications Dosing Guide"  in media tab for additional information. Please  also refer to " Patient Self Inventory" in the Media  tab for reviewed elements of pertinent patient history.  Harvel Ricks participated in the discussions, expressed understanding, and voiced agreement with the above plans.  All questions were answered to her satisfaction. she is encouraged to contact clinic should she have any questions or concerns prior to her return visit.     Follow up plan: - Return in about 3 months (around 09/23/2023) for Diabetes F/U with A1c in office, Previsit labs, Bring meter and logs, Thyroid follow up.   Ronny Bacon, [facility] [address] [place], [place] [address] Phone: [phone] Fax: [phone]  06/23/2023, 4:31 PM

## 2023-06-23 NOTE — Progress Notes (Signed)
 Medical Nutrition Therapy  Appointment Start time:  [phone] Appointment End time:  1715  Primary concerns today: Type 2 DM and Morbid obesity  Referral diagnosis: E11.8, [id number]. Preferred learning style: Visuals and hands on. Learning readiness: Ready   NUTRITION ASSESSMENT Follow up DM 48 yr old wfemale referred for obesity and uncontrolled Type 2 DM.   Saw April Gonzales. A1C 11%. Started meal time insulin. Novolog 6 units TID with sliding scale. Basaglar 30 units at night. Dad does cooking due to her leg pain. She is not very mobile right now due to chronic leg pain. She can't stand on her leg for very long and had difficulty walking on it. Some days better than others. Gained 4 lbs since visit with me. Dexom 83% very high, 17% high.   Anthropometrics  Wt Readings from Last 3 Encounters:  06/23/23 (!) 304 lb 12.8 oz (138.3 kg)  04/07/23 (!) 303 lb (137.4 kg)  02/09/23 (!) 301 lb 12.8 oz (136.9 kg)   Ht Readings from Last 3 Encounters:  06/23/23 5\' 5"  (1.651 m)  04/07/23 5\' 5"  (1.651 m)  02/09/23 5\' 5"  (1.651 m)   There is no height or weight on file to calculate BMI. @BMIFA @ Facility age limit for growth %iles is 20 years. Facility age limit for growth %iles is 20 years.     Clinical Medical Hx: See chart Medications: Lantus, GLipizide, Trulicity, Metformin Labs:  Lab Results  Component Value Date   HGBA1C 11.0 (A) 06/23/2023      Latest Ref Rng & Units 04/28/2023    9:00 AM 09/22/2022    5:05 AM 09/21/2022    2:39 PM  CMP  Glucose 70 - 99 mg/dL 132  440  102   BUN 6 - 20 mg/dL 16  17  13    Creatinine 0.44 - 1.00 mg/dL 7.25  3.66  4.40   Sodium 135 - 145 mmol/L 132  134  137   Potassium 3.5 - 5.1 mmol/L 4.5  4.2  3.9   Chloride 98 - 111 mmol/L 98  98  102   CO2 22 - 32 mmol/L 24  21  24    Calcium 8.9 - 10.3 mg/dL 8.8  9.1  8.8   Total Protein 6.5 - 8.1 g/dL 7.7     Total Bilirubin 0.0 - 1.2 mg/dL 0.5     Alkaline Phos 38 - 126 U/L 100     AST 15 - 41 U/L 26     ALT 0 - 44  U/L 32      Lipid Panel     Component Value Date/Time   CHOL 105 12/24/2021 1052   TRIG 119.0 12/24/2021 1052   HDL 38.60 (L) 12/24/2021 1052   CHOLHDL 3 12/24/2021 1052   VLDL 23.8 12/24/2021 1052   LDLCALC 42 12/24/2021 1052    Notable Signs/Symptoms: Increased thirsty, frequent urination, sleelpy, fatigue, blurry vision  Lifestyle & Dietary Hx LIves by herself.  Estimated daily fluid intake: 40 oz--drinking more water now.. still drinking sodas Supplements: Vit D Sleep: poor-- stays up at night and sleep in during the day Stress / self-care: yes Current average weekly physical activity: ADL   24-Hr Dietary Recall Eats 2-3 meals per day. Eats out due to lack of ability to stand and cook for long periods of time. Recently started eating more frozen microwavable foods and vegetables. Drinking more water and trying to cut back on sodas.  Estimated Energy Needs Calories: 1200 Carbohydrate: 135g Protein: 90g Fat: 33g  NUTRITION DIAGNOSIS  NI-1.7 Predicted excessive energy intake As related to high calorie diet.  As evidenced by DM TYpe 2 A1C 10.8%, BMI 50 .   NUTRITION INTERVENTION  Nutrition education (E-1) on the following topics:  Nutrition and Diabetes education provided on My Plate, CHO counting, meal planning, portion sizes, timing of meals, avoiding snacks between meals unless having a low blood sugar, target ranges for A1C and blood sugars, signs/symptoms and treatment of hyper/hypoglycemia, monitoring blood sugars, taking medications as prescribed, benefits of exercising 30 minutes per day and prevention of complications of DM.  Lifestyle Medicine  - Whole Food, Plant Predominant Nutrition is highly recommended: Eat Plenty of vegetables, Mushrooms, fruits, Legumes, Whole Grains, Nuts, seeds in lieu of processed meats, processed snacks/pastries red meat, poultry, eggs.    -It is better to avoid simple carbohydrates including: Cakes, Sweet Desserts, Ice Cream, Soda  (diet and regular), Sweet Tea, Candies, Chips, Cookies, Store Bought Juices, Alcohol in Excess of  1-2 drinks a day, Lemonade,  Artificial Sweeteners, Doughnuts, Coffee Creamers, "Sugar-free" Products, etc, etc.  This is not a complete list.....  Exercise: If you are able: 30 -60 minutes a day ,4 days a week, or 150 minutes a week.  The longer the better.  Combine stretch, strength, and aerobic activities.  If you were told in the past that you have high risk for cardiovascular diseases, you may seek evaluation by your heart doctor prior to initiating moderate to intense exercise programs.   Handouts Provided Include  Lifestyle Handouts Know your numbers LIfestyle grocery list  Learning Style & Readiness for Change Teaching method utilized: Visual & Auditory  Demonstrated degree of understanding via: Teach Back  Barriers to learning/adherence to lifestyle change: none  Goals Established by Pt  Goals Avoid pork and beef and only eat chicken, fish or [place] for animal meat Increase fresh fruits and vegetables. Watch portions Choose rotisserie chicken instea of lunch meal Take insulin before meals and use sliding scale 6 units of Novolog with meals and Basaglar 30 units .  MONITORING & EVALUATION Dietary intake, weekly physical activity, and weight and blood sugars in 1 month.  Next Steps  Patient is to  work on eating better balanced meals with whole plant based foods and cutting out sodas.[name]

## 2023-06-27 ENCOUNTER — Encounter: Payer: Self-pay | Admitting: Nurse Practitioner

## 2023-07-05 ENCOUNTER — Encounter: Payer: Self-pay | Admitting: Nutrition

## 2023-07-05 MED ORDER — INSULIN LISPRO (1 UNIT DIAL) 100 UNIT/ML (KWIKPEN): 6.0000 [IU] | PEN_INJECTOR | Freq: Three times a day (TID) | SUBCUTANEOUS | 3 refills | Status: DC

## 2023-07-09 ENCOUNTER — Other Ambulatory Visit: Payer: Self-pay | Admitting: Family Medicine

## 2023-07-09 DIAGNOSIS — E1165 Type 2 diabetes mellitus with hyperglycemia: Secondary | ICD-10-CM

## 2023-08-03 ENCOUNTER — Encounter: Attending: Family Medicine | Admitting: Nutrition

## 2023-08-03 VITALS — Wt 302.0 lb

## 2023-08-03 DIAGNOSIS — E559 Vitamin D deficiency, unspecified: Secondary | ICD-10-CM | POA: Diagnosis present

## 2023-08-03 DIAGNOSIS — I10 Essential (primary) hypertension: Secondary | ICD-10-CM | POA: Diagnosis present

## 2023-08-03 DIAGNOSIS — Z6841 Body Mass Index (BMI) 40.0 and over, adult: Secondary | ICD-10-CM | POA: Insufficient documentation

## 2023-08-03 DIAGNOSIS — E119 Type 2 diabetes mellitus without complications: Secondary | ICD-10-CM | POA: Insufficient documentation

## 2023-08-03 NOTE — Patient Instructions (Signed)
 GOials Inject 40 units of Basaglar at night and 10 units with sliding scale with meals of Humalog to help bring blood sugars down.   Inject insulin straight into abdomen area instead of at a slant. Eat three meals per day on time. Cut out processed meats Eat fruit, vegetables --food from garden. Cut out cut out sausage, bacon and cheesey foods and processed foods.

## 2023-08-03 NOTE — Progress Notes (Unsigned)
 Medical Nutrition Therapy  Appointment Start time:  [phone] Appointment End time:  1715  Primary concerns today: Type 2 DM and Morbid obesity  Referral diagnosis: E11.8, [id number]. Preferred learning style: Visuals and hands on. Learning readiness: Ready   NUTRITION ASSESSMENT Follow up DM  Dexcom shows her avg blood sugars is 350 mg/dl. She notes she has been injecting her insulin at a diagonal instead of a 90 degree angle. She struggles with consistently of meals due to her dad fixing and cooking meals. Foods tend to be fried and highly processed.  She notes she needs to cut out sodas and tea. TIR 98% Very high. 2% high. Shared results with Ronny Bacon, FNP at [facility]. She gave verbal orders to increase Basaglar to 40 units and offer 10 units with meals plus sliding scale of Humalog.   Anthropometrics  Wt Readings from Last 3 Encounters:  06/23/23 (!) 304 lb 12.8 oz (138.3 kg)  04/07/23 (!) 303 lb (137.4 kg)  02/09/23 (!) 301 lb 12.8 oz (136.9 kg)   Ht Readings from Last 3 Encounters:  06/23/23 5\' 5"  (1.651 m)  04/07/23 5\' 5"  (1.651 m)  02/09/23 5\' 5"  (1.651 m)   There is no height or weight on file to calculate BMI. @BMIFA @ Facility age limit for growth %iles is 20 years. Facility age limit for growth %iles is 20 years.     Clinical Medical Hx: See chart Medications: Lantus, GLipizide, Trulicity, Metformin Labs:  Lab Results  Component Value Date   HGBA1C 11.0 (A) 06/23/2023      Latest Ref Rng & Units 04/28/2023    9:00 AM 09/22/2022    5:05 AM 09/21/2022    2:39 PM  CMP  Glucose 70 - 99 mg/dL 960  454  098   BUN 6 - 20 mg/dL 16  17  13    Creatinine 0.44 - 1.00 mg/dL 1.19  1.47  8.29   Sodium 135 - 145 mmol/L 132  134  137   Potassium 3.5 - 5.1 mmol/L 4.5  4.2  3.9   Chloride 98 - 111 mmol/L 98  98  102   CO2 22 - 32 mmol/L 24  21  24    Calcium 8.9 - 10.3 mg/dL 8.8  9.1  8.8   Total Protein 6.5 - 8.1 g/dL 7.7     Total Bilirubin 0.0 - 1.2 mg/dL 0.5     Alkaline Phos  38 - 126 U/L 100     AST 15 - 41 U/L 26     ALT 0 - 44 U/L 32      Lipid Panel     Component Value Date/Time   CHOL 105 12/24/2021 1052   TRIG 119.0 12/24/2021 1052   HDL 38.60 (L) 12/24/2021 1052   CHOLHDL 3 12/24/2021 1052   VLDL 23.8 12/24/2021 1052   LDLCALC 42 12/24/2021 1052    Notable Signs/Symptoms: Increased thirsty, frequent urination, sleelpy, fatigue, blurry vision  Lifestyle & Dietary Hx LIves by herself.  Estimated daily fluid intake: 40 oz--drinking more water now.. still drinking sodas Supplements: Vit D Sleep: poor-- stays up at night and sleep in during the day Stress / self-care: yes Current average weekly physical activity: ADL   24-Hr Dietary Recall 1 [place] link, 2 eggs, waffle an maple syrup Suasage balls.   Eats 2-3 meals per day. Eats out due to lack of ability to stand and cook for long periods of time. Recently started eating more frozen microwavable foods and vegetables. Drinking more water  and trying to cut back on sodas.  Estimated Energy Needs Calories: 1200 Carbohydrate: 135g Protein: 90g Fat: 33g   NUTRITION DIAGNOSIS  NI-1.7 Predicted excessive energy intake As related to high calorie diet.  As evidenced by DM TYpe 2 A1C 10.8%, BMI 50 .   NUTRITION INTERVENTION  Nutrition education (E-1) on the following topics:  Nutrition and Diabetes education provided on My Plate, CHO counting, meal planning, portion sizes, timing of meals, avoiding snacks between meals unless having a low blood sugar, target ranges for A1C and blood sugars, signs/symptoms and treatment of hyper/hypoglycemia, monitoring blood sugars, taking medications as prescribed, benefits of exercising 30 minutes per day and prevention of complications of DM.  Lifestyle Medicine  - Whole Food, Plant Predominant Nutrition is highly recommended: Eat Plenty of vegetables, Mushrooms, fruits, Legumes, Whole Grains, Nuts, seeds in lieu of processed meats, processed snacks/pastries  red meat, poultry, eggs.    -It is better to avoid simple carbohydrates including: Cakes, Sweet Desserts, Ice Cream, Soda (diet and regular), Sweet Tea, Candies, Chips, Cookies, Store Bought Juices, Alcohol in Excess of  1-2 drinks a day, Lemonade,  Artificial Sweeteners, Doughnuts, Coffee Creamers, "Sugar-free" Products, etc, etc.  This is not a complete list.....  Exercise: If you are able: 30 -60 minutes a day ,4 days a week, or 150 minutes a week.  The longer the better.  Combine stretch, strength, and aerobic activities.  If you were told in the past that you have high risk for cardiovascular diseases, you may seek evaluation by your heart doctor prior to initiating moderate to intense exercise programs.   Handouts Provided Include  Lifestyle Handouts Know your numbers LIfestyle grocery list  Learning Style & Readiness for Change Teaching method utilized: Visual & Auditory  Demonstrated degree of understanding via: Teach Back  Barriers to learning/adherence to lifestyle change: none  Goals Established by Pt  .GOials Inject 40 units of Basaglar at night and 10 units with sliding scale with meals of Humalog to help bring blood sugars down.   Inject insulin straight into abdomen area instead of at a slant. Eat three meals per day on time. Cut out processed meats Eat fruit, vegetables --food from garden. Cut out cut out sausage, bacon and cheesey foods and processed foods.  MONITORING & EVALUATION Dietary intake, weekly physical activity, and weight and blood sugars in 1 month.  Next Steps  Patient is to  work on eating better balanced meals with whole plant based foods and cutting out sodas.Aaron Aas

## 2023-08-10 ENCOUNTER — Other Ambulatory Visit: Payer: Self-pay | Admitting: Nurse Practitioner

## 2023-08-10 DIAGNOSIS — E1169 Type 2 diabetes mellitus with other specified complication: Secondary | ICD-10-CM

## 2023-08-15 ENCOUNTER — Ambulatory Visit (INDEPENDENT_AMBULATORY_CARE_PROVIDER_SITE_OTHER): Payer: Commercial Managed Care - HMO | Admitting: Podiatry

## 2023-08-15 ENCOUNTER — Encounter: Payer: Self-pay | Admitting: Podiatry

## 2023-08-15 DIAGNOSIS — E1169 Type 2 diabetes mellitus with other specified complication: Secondary | ICD-10-CM | POA: Diagnosis not present

## 2023-08-15 DIAGNOSIS — B351 Tinea unguium: Secondary | ICD-10-CM | POA: Diagnosis not present

## 2023-08-15 DIAGNOSIS — M79675 Pain in left toe(s): Secondary | ICD-10-CM

## 2023-08-15 DIAGNOSIS — M79674 Pain in right toe(s): Secondary | ICD-10-CM

## 2023-08-15 NOTE — Progress Notes (Signed)
  Subjective:  Patient ID: April Gonzales, female    DOB: 02-06-1976,   MRN: [id number]  No chief complaint on file.   48 y.o. female presents for concern of thickened elongated and painful nails that are difficult to trim. Requesting to have them trimmed today. Relates burning and tingling in their feet. Patient is diabetic and last A1c was  Lab Results  Component Value Date   HGBA1C 11.0 (A) 06/23/2023   .   PCP:  Abram Abraham, NP-C    . Denies any other pedal complaints. Denies n/v/f/c.   Past Medical History:  Diagnosis Date   Anemia    Anxiety    Arthritis    B12 deficiency    Back pain    Celiac disease    Chronic fatigue syndrome    Constipation    Depression    DM (diabetes mellitus) (HCC)    Gallbladder problem    GERD (gastroesophageal reflux disease)    Heartburn    History of kidney stones    Hypertension    Joint pain    Pancreatitis    Psoriasis    SOBOE (shortness of breath on exertion)    Vitamin D  deficiency     Objective:  Physical Exam: Vascular: DP/PT pulses 2/4 bilateral. CFT <3 seconds. Absent hair growth on digits. Edema noted to bilateral lower extremities. Xerosis noted bilaterally.  Skin. No lacerations or abrasions bilateral feet. Nails 1-5 bilateral  are thickened discolored and elongated with subungual debris. Hyperkeratosis mildly on plantar right hallux and wound remains healed.  Musculoskeletal: MMT 5/5 bilateral lower extremities in DF, PF, Inversion and Eversion. Deceased ROM in DF of ankle joint.  Neurological: Sensation intact to light touch. Protective sensation diminished bilateral.    Assessment:   1. Pain due to onychomycosis of toenails of both feet   2. Type 2 diabetes mellitus with other specified complication, without long-term current use of insulin  [facility])       Plan:  Patient was evaluated and treated and all questions answered. -Right hallux wound well healed.  -Discussed and educated patient on diabetic foot  care, especially with  regards to the vascular, neurological and musculoskeletal systems.  -Stressed the importance of good glycemic control and the detriment of not  controlling glucose levels in relation to the foot. -Discussed supportive shoes at all times and checking feet regularly.  -Mechanically debrided all nails 1-5 bilateral using sterile nail nipper and filed with dremel without incident  -Answered all patient questions -Patient to return  in 3 months for at risk foot care -Patient advised to call the office if any problems or questions arise in the meantime.   Jennefer Moats, DPM

## 2023-08-16 ENCOUNTER — Encounter: Payer: Self-pay | Admitting: Nutrition

## 2023-08-31 ENCOUNTER — Ambulatory Visit: Admitting: Nutrition

## 2023-10-02 LAB — LAB REPORT - SCANNED: EGFR: 107

## 2023-10-04 ENCOUNTER — Other Ambulatory Visit: Payer: Self-pay | Admitting: Family Medicine

## 2023-10-04 DIAGNOSIS — I10 Essential (primary) hypertension: Secondary | ICD-10-CM

## 2023-10-05 ENCOUNTER — Ambulatory Visit: Admitting: Nurse Practitioner

## 2023-10-06 ENCOUNTER — Encounter: Payer: Self-pay | Admitting: Family Medicine

## 2023-10-06 ENCOUNTER — Ambulatory Visit: Payer: Self-pay | Admitting: Family Medicine

## 2023-10-06 ENCOUNTER — Ambulatory Visit (INDEPENDENT_AMBULATORY_CARE_PROVIDER_SITE_OTHER): Payer: Commercial Managed Care - HMO | Admitting: Family Medicine

## 2023-10-06 ENCOUNTER — Ambulatory Visit (INDEPENDENT_AMBULATORY_CARE_PROVIDER_SITE_OTHER)

## 2023-10-06 VITALS — BP 158/90 | HR 90 | Temp 98.3°F | Ht 65.0 in | Wt 303.0 lb

## 2023-10-06 DIAGNOSIS — I10 Essential (primary) hypertension: Secondary | ICD-10-CM | POA: Diagnosis not present

## 2023-10-06 DIAGNOSIS — E1165 Type 2 diabetes mellitus with hyperglycemia: Secondary | ICD-10-CM

## 2023-10-06 DIAGNOSIS — R600 Localized edema: Secondary | ICD-10-CM

## 2023-10-06 DIAGNOSIS — R739 Hyperglycemia, unspecified: Secondary | ICD-10-CM | POA: Diagnosis not present

## 2023-10-06 DIAGNOSIS — Z124 Encounter for screening for malignant neoplasm of cervix: Secondary | ICD-10-CM | POA: Diagnosis not present

## 2023-10-06 LAB — COMPREHENSIVE METABOLIC PANEL WITH GFR
ALT: 32 U/L (ref 0–35)
AST: 28 U/L (ref 0–37)
Albumin: 3.7 g/dL (ref 3.5–5.2)
Alkaline Phosphatase: 98 U/L (ref 39–117)
BUN: 12 mg/dL (ref 6–23)
CO2: 28 meq/L (ref 19–32)
Calcium: 9.2 mg/dL (ref 8.4–10.5)
Chloride: 100 meq/L (ref 96–112)
Creatinine, Ser: 0.82 mg/dL (ref 0.40–1.20)
GFR: 84.34 mL/min (ref >60.00)
Glucose, Bld: 359 mg/dL — ABNORMAL HIGH (ref 70–99)
Potassium: 4.5 meq/L (ref 3.5–5.1)
Sodium: 134 meq/L — ABNORMAL LOW (ref 135–145)
Total Bilirubin: 0.4 mg/dL (ref 0.2–1.2)
Total Protein: 7.8 g/dL (ref 6.0–8.3)

## 2023-10-06 LAB — CBC WITH DIFFERENTIAL/PLATELET
Basophils Absolute: 0 10*3/uL (ref 0.0–0.1)
Basophils Relative: 0.4 % (ref 0.0–3.0)
Eosinophils Absolute: 0.2 10*3/uL (ref 0.0–0.7)
Eosinophils Relative: 1.5 % (ref 0.0–5.0)
HCT: 41.6 % (ref 36.0–46.0)
Hemoglobin: 13.4 g/dL (ref 12.0–15.0)
Lymphocytes Relative: 20.7 % (ref 12.0–46.0)
Lymphs Abs: 2.8 10*3/uL (ref 0.7–4.0)
MCHC: 32.2 g/dL (ref 30.0–36.0)
MCV: 80.1 fl (ref 78.0–100.0)
Monocytes Absolute: 0.6 10*3/uL (ref 0.1–1.0)
Monocytes Relative: 4.6 % (ref 3.0–12.0)
Neutro Abs: 9.7 10*3/uL — ABNORMAL HIGH (ref 1.4–7.7)
Neutrophils Relative %: 72.8 % (ref 43.0–77.0)
Platelets: 271 10*3/uL (ref 150.0–400.0)
RBC: 5.19 Mil/uL — ABNORMAL HIGH (ref 3.87–5.11)
RDW: 16.5 % — ABNORMAL HIGH (ref 11.5–15.5)
WBC: 13.4 10*3/uL — ABNORMAL HIGH (ref 4.0–10.5)

## 2023-10-06 LAB — TSH: TSH: 2.1 u[IU]/mL (ref 0.35–5.50)

## 2023-10-06 LAB — BRAIN NATRIURETIC PEPTIDE: Pro B Natriuretic peptide (BNP): 17 pg/mL (ref 0.0–100.0)

## 2023-10-06 NOTE — Progress Notes (Unsigned)
 Subjective:     Patient ID: April Gonzales, female    DOB: [birth year], 48 y.o.   MRN: [id number]  Chief Complaint  Patient presents with   Follow-up    HPI  History of Present Illness         Here to follow up on chronic health conditions.   C/o bilateral LE edema.  Denies   DM- Hulon Magic is her endocrinologist   Blood sugar 368 now  Just okay is working I am awake Humalog  14 units  Metformin   Glipizide   Trulicity  Sunday  Basaglar  30 units    Valsartan      Health Maintenance Due  Topic Date Due   OPHTHALMOLOGY EXAM  Never done   HIV Screening  Never done   Hepatitis C Screening  Never done   Pneumococcal Vaccine 70-36 Years old (1 of 2 - PCV) Never done   Cervical Cancer Screening (HPV/Pap Cotest)  Never done   Colonoscopy  Never done   COVID-19 Vaccine (5 - 2024-25 season) 12/19/2022   Diabetic kidney evaluation - Urine ACR  02/27/2023   FOOT EXAM  09/09/2023    Past Medical History:  Diagnosis Date   Anemia    Anxiety    Arthritis    B12 deficiency    Back pain    Celiac disease    Chronic fatigue syndrome    Constipation    Depression    DM (diabetes mellitus) (HCC)    Gallbladder problem    GERD (gastroesophageal reflux disease)    Heartburn    History of kidney stones    Hypertension    Joint pain    Pancreatitis    Psoriasis    SOBOE (shortness of breath on exertion)    Vitamin D  deficiency     Past Surgical History:  Procedure Laterality Date   CHOLECYSTECTOMY     CYSTOSCOPY/URETEROSCOPY/HOLMIUM LASER/STENT PLACEMENT Right 09/21/2022   Procedure: CYSTOSCOPY RIGHT URETEROSCOPY/HOLMIUM LASER/STENT PLACEMENT;  Surgeon: Christina Coyer, MD;  Location: [facility];  Service: Urology;  Laterality: Right;  75 MINS FOR CASE   SPINE SURGERY  2010   WISDOM TOOTH EXTRACTION      Family History  Problem Relation Age of Onset   Hyperlipidemia Mother    Diabetes Mother    Diabetes Mellitus II Mother    Hypertension Mother    Obesity  Mother    Arthritis Mother    Obesity Father    Hypertension Father    CAD Father    Chronic Renal Failure Father    Hyperlipidemia Father    Heart disease Father    Kidney disease Father    Alcoholism Father     Social History   Socioeconomic History   Marital status: Single    Spouse name: Not on file   Number of children: Not on file   Years of education: Not on file   Highest education level: Associate degree: occupational, Scientist, product/process development, or vocational program  Occupational History   Occupation: Currently on leave of absence  Tobacco Use   Smoking status: Never   Smokeless tobacco: Never  Vaping Use   Vaping status: Never Used  Substance and Sexual Activity   Alcohol use: No   Drug use: No   Sexual activity: Not on file  Other Topics Concern   Not on file  Social History Narrative   Not on file   Social Drivers of Health   Financial Resource Strain: Medium Risk (10/02/2023)   Overall Financial Resource Strain (  CARDIA)    Difficulty of Paying Living Expenses: Somewhat hard  Food Insecurity: Food Insecurity Present (10/02/2023)   Hunger Vital Sign    Worried About Running Out of Food in the Last Year: Sometimes true    Ran Out of Food in the Last Year: Sometimes true  Transportation Needs: No Transportation Needs (10/02/2023)   PRAPARE - Administrator, Civil Service (Medical): No    Lack of Transportation (Non-Medical): No  Physical Activity: Inactive (10/02/2023)   Exercise Vital Sign    Days of Exercise per Week: 0 days    Minutes of Exercise per Session: Not on file  Stress: Stress Concern Present (10/02/2023)   [employer] of Occupational Health - Occupational Stress Questionnaire    Feeling of Stress: Rather much  Social Connections: Socially Isolated (10/02/2023)   Social Connection and Isolation Panel    Frequency of Communication with Friends and Family: Once a week    Frequency of Social Gatherings with Friends and Family: Once a week     Attends Religious Services: Never    Database administrator or Organizations: No    Attends Engineer, structural: Not on file    Marital Status: Never married  Intimate Partner Violence: Not on file    Outpatient Medications Prior to Visit  Medication Sig Dispense Refill   busPIRone (BUSPAR) 5 MG tablet Take 5 mg by mouth 2 (two) times daily.     Continuous Glucose Sensor (DEXCOM G7 SENSOR) MISC Inject 1 Application into the skin as directed. Change sensor every 10 days as directed. 9 each 3   Dulaglutide  (TRULICITY ) 1.5 MG/0.5ML SOAJ INJECT 1.5 MG SUBCUTANEOUSLY ONCE A WEEK 2 mL 2   escitalopram  (LEXAPRO ) 20 MG tablet Take 1 tablet (20 mg total) by mouth daily. 30 tablet 2   glipiZIDE  (GLUCOTROL  XL) 5 MG 24 hr tablet TAKE 1 TABLET BY MOUTH EVERY DAY WITH BREAKFAST 90 tablet 1   hydrocortisone -pramoxine (PROCTOFOAM-HC) rectal foam Place 1 applicator rectally 2 (two) times daily. 10 g 1   Insulin  Glargine (BASAGLAR  KWIKPEN) 100 UNIT/ML Inject 30 Units into the skin at bedtime. 30 mL 3   insulin  lispro (HUMALOG  KWIKPEN) 100 UNIT/ML KwikPen Inject 6-12 Units into the skin 3 (three) times daily. 36 mL 3   Insulin  Pen Needle (PEN NEEDLES) 31G X 8 MM MISC Use to inject insulin  4 times daily 300 each 3   metFORMIN  (GLUCOPHAGE ) 1000 MG tablet Take 1 tablet (1,000 mg total) by mouth 2 (two) times daily with a meal. 180 tablet 1   pregabalin (LYRICA) 75 MG capsule Take 75 mg by mouth 2 (two) times daily as needed (pain).     Risankizumab-rzaa (SKYRIZI) 150 MG/ML SOSY Inject 150 mg into the skin every 3 (three) months.     Tapinarof (VTAMA) 1 % CREA Apply 1 Application topically daily as needed (psoriasis).     triamcinolone cream (KENALOG) 0.1 % Apply 1 application topically 2 (two) times daily as needed (psoriasis).      valsartan  (DIOVAN ) 160 MG tablet TAKE 1 TABLET(160 MG) BY MOUTH DAILY 90 tablet 1   Vitamin D , Ergocalciferol , (DRISDOL ) 1.25 MG (50000 UNIT) CAPS capsule TAKE 1 CAPSULE  (50,000 UNITS TOTAL) BY MOUTH once weekly 12 capsule 3   No facility-administered medications prior to visit.    Allergies  Allergen Reactions   Dilaudid [Hydromorphone Hcl] Nausea And Vomiting   Hydromorphone     Other reaction(s): Unknown   Methotrexate Other (See Comments)  Childhood reaction, foaming at the mouth    ROS     Objective:    Physical Exam   BP (!) 158/90 Comment: Repeat BP  Pulse 90   Temp 98.3 F (36.8 C) (Temporal)   Ht 5' 5 (1.651 m)   Wt (!) 303 lb (137.4 kg)   SpO2 100%   BMI 50.42 kg/m  Wt Readings from Last 3 Encounters:  10/06/23 (!) 303 lb (137.4 kg)  08/03/23 (!) 302 lb (137 kg)  06/23/23 (!) 304 lb 12.8 oz (138.3 kg)       Assessment & Plan:   Problem List Items Addressed This Visit     Essential hypertension - Primary   Relevant Orders   CBC with Differential/Platelet   Comprehensive metabolic panel with GFR   TSH   DG Chest 2 View   Morbid obesity (HCC)   Relevant Orders   CBC with Differential/Platelet   Comprehensive metabolic panel with GFR   TSH   Uncontrolled type 2 diabetes mellitus with hyperglycemia (HCC)   Relevant Orders   Ambulatory referral to Ophthalmology   CBC with Differential/Platelet   Comprehensive metabolic panel with GFR   Other Visit Diagnoses       Screening for cervical cancer       Relevant Orders   Ambulatory referral to Gynecology     Bilateral leg edema       Relevant Orders   CBC with Differential/Platelet   Comprehensive metabolic panel with GFR   TSH   Brain natriuretic peptide   DG Chest 2 View     Hyperglycemia       Relevant Orders   CBC with Differential/Platelet   Comprehensive metabolic panel with GFR   TSH       I am having April Gonzales maintain her triamcinolone cream, hydrocortisone -pramoxine, escitalopram , Skyrizi, busPIRone, pregabalin, Vtama, Vitamin D  (Ergocalciferol ), Pen Needles, metFORMIN , Basaglar  KwikPen, Dexcom G7 Sensor, insulin  lispro, glipiZIDE ,  Trulicity , and valsartan .  No orders of the defined types were placed in this encounter.

## 2023-10-06 NOTE — Patient Instructions (Signed)
 Please go downstairs for labs and a chest x-ray before you leave.  Once I have your lab results, I will be sending in a new blood pressure medication.  Continue your current medications.  You should take your Humalog  with every meal.  Cut back on carbohydrates and sugar.  Please call and try to get a sooner appointment with your endocrinologist due to your elevated blood sugars.

## 2023-10-10 MED ORDER — VALSARTAN-HYDROCHLOROTHIAZIDE 160-12.5 MG PO TABS: 1.0000 | ORAL_TABLET | Freq: Every day | ORAL | 1 refills | Status: AC

## 2023-10-17 ENCOUNTER — Encounter: Payer: Self-pay | Admitting: Family Medicine

## 2023-11-14 ENCOUNTER — Ambulatory Visit (INDEPENDENT_AMBULATORY_CARE_PROVIDER_SITE_OTHER): Admitting: Podiatry

## 2023-11-14 DIAGNOSIS — Z91199 Patient's noncompliance with other medical treatment and regimen due to unspecified reason: Secondary | ICD-10-CM

## 2023-11-14 NOTE — Progress Notes (Signed)
 No show

## 2023-12-01 ENCOUNTER — Ambulatory Visit: Admitting: Nurse Practitioner

## 2023-12-05 ENCOUNTER — Ambulatory Visit (INDEPENDENT_AMBULATORY_CARE_PROVIDER_SITE_OTHER): Admitting: Podiatry

## 2023-12-05 ENCOUNTER — Encounter: Payer: Self-pay | Admitting: Podiatry

## 2023-12-05 DIAGNOSIS — M79675 Pain in left toe(s): Secondary | ICD-10-CM | POA: Diagnosis not present

## 2023-12-05 DIAGNOSIS — E1169 Type 2 diabetes mellitus with other specified complication: Secondary | ICD-10-CM

## 2023-12-05 DIAGNOSIS — M79674 Pain in right toe(s): Secondary | ICD-10-CM

## 2023-12-05 DIAGNOSIS — B351 Tinea unguium: Secondary | ICD-10-CM

## 2023-12-05 NOTE — Progress Notes (Signed)
  Subjective:  Patient ID: April Gonzales, female    DOB: [birth year],   MRN: [id number]  Chief Complaint  Patient presents with   Diabetes    Check my feet and cut my nails.  Saw Dr. Benton Rio - 06/23/2023; A1c - 44    48 y.o. female presents for concern of thickened elongated and painful nails that are difficult to trim. Requesting to have them trimmed today. Relates burning and tingling in their feet. Patient is diabetic and last A1c was  Lab Results  Component Value Date   HGBA1C 11.0 (A) 06/23/2023   .   PCP:  Lendia Boby CROME, NP-C    . Denies any other pedal complaints. Denies n/v/f/c.   Past Medical History:  Diagnosis Date   Anemia    Anxiety    Arthritis    B12 deficiency    Back pain    Celiac disease    Chronic fatigue syndrome    Constipation    Depression    DM (diabetes mellitus) (HCC)    Gallbladder problem    GERD (gastroesophageal reflux disease)    Heartburn    History of kidney stones    Hypertension    Joint pain    Pancreatitis    Psoriasis    SOBOE (shortness of breath on exertion)    Vitamin D  deficiency     Objective:  Physical Exam: Vascular: DP/PT pulses 2/4 bilateral. CFT <3 seconds. Absent hair growth on digits. Edema noted to bilateral lower extremities. Xerosis noted bilaterally.  Skin. No lacerations or abrasions bilateral feet. Nails 1-5 bilateral  are thickened discolored and elongated with subungual debris. Hyperkeratosis mildly on plantar right hallux and wound remains healed.  Musculoskeletal: MMT 5/5 bilateral lower extremities in DF, PF, Inversion and Eversion. Deceased ROM in DF of ankle joint.  Neurological: Sensation intact to light touch. Protective sensation diminished bilateral.    Assessment:   1. Pain due to onychomycosis of toenails of both feet   2. Type 2 diabetes mellitus with other specified complication, without long-term current use of insulin  (HCC)       Plan:  Patient was evaluated and treated and  all questions answered. -Right hallux wound well healed.  -Discussed and educated patient on diabetic foot care, especially with  regards to the vascular, neurological and musculoskeletal systems.  -Stressed the importance of good glycemic control and the detriment of not  controlling glucose levels in relation to the foot. -Discussed supportive shoes at all times and checking feet regularly.  -Mechanically debrided all nails 1-5 bilateral using sterile nail nipper and filed with dremel without incident  -Answered all patient questions -Patient to return  in 3 months for at risk foot care -Patient advised to call the office if any problems or questions arise in the meantime.   Asberry Failing, DPM

## 2023-12-07 ENCOUNTER — Encounter: Payer: Self-pay | Admitting: Radiology

## 2023-12-07 ENCOUNTER — Ambulatory Visit (INDEPENDENT_AMBULATORY_CARE_PROVIDER_SITE_OTHER): Admitting: Radiology

## 2023-12-07 ENCOUNTER — Other Ambulatory Visit (HOSPITAL_COMMUNITY)
Admission: RE | Admit: 2023-12-07 | Discharge: 2023-12-07 | Disposition: A | Discharge status: Home / Self Care | Source: Ambulatory Visit | Attending: Radiology | Admitting: Radiology

## 2023-12-07 VITALS — BP 142/76 | HR 102 | Ht 65.0 in | Wt 302.0 lb

## 2023-12-07 DIAGNOSIS — Z01419 Encounter for gynecological examination (general) (routine) without abnormal findings: Secondary | ICD-10-CM

## 2023-12-07 DIAGNOSIS — Z1331 Encounter for screening for depression: Secondary | ICD-10-CM

## 2023-12-07 NOTE — Progress Notes (Signed)
 April Gonzales [birth year] [id number]   History:  48 y.o. G0 presents for annual exam. Hx of PCOS, multiple medical problems referred by PCP. No gyn concerns.  Gynecologic History Patient's last menstrual period was 11/08/2023 (approximate).   Contraception/Family planning: abstinence Sexually active: no Last Pap: 2022 per pt Last mammogram: 01/2023 Cologuard negative 04/2023  Obstetric History OB History  Gravida Para Term Preterm AB Living  0 0 0 0 0 0  SAB IAB Ectopic Multiple Live Births  0 0 0 0 0       12/07/2023   11:04 AM 04/07/2023    1:34 PM 10/07/2022    8:12 AM  Depression screen PHQ 2/9  Decreased Interest 1 0 0  Down, Depressed, Hopeless 1 0 0  PHQ - 2 Score 2 0 0  Altered sleeping 1    Tired, decreased energy 3    Change in appetite 1    Feeling bad or failure about yourself  0    Trouble concentrating 3    Moving slowly or fidgety/restless 1    Suicidal thoughts 0    PHQ-9 Score 11    Difficult doing work/chores Extremely dIfficult       The following portions of the patient's history were reviewed and updated as appropriate: allergies, current medications, past family history, past medical history, past social history, past surgical history, and problem list.  Review of Systems  All other systems reviewed and are negative.   Past medical history, past surgical history, family history and social history were all reviewed and documented in the EPIC chart.  Exam:  Vitals:   12/07/23 1102 12/07/23 1112  BP: (!) 160/82 (!) 142/76  Pulse: (!) 102   SpO2: 99%   Weight: (!) 302 lb (137 kg)   Height: 5' 5 (1.651 m)    Body mass index is 50.26 kg/m.  Physical Exam Vitals and nursing note reviewed. Exam conducted with a chaperone present.  Constitutional:      Appearance: Normal appearance. She is morbidly obese.  HENT:     Head: Normocephalic and atraumatic.  Neck:     Thyroid : No thyroid  mass, thyromegaly or thyroid  tenderness.   Cardiovascular:     Rate and Rhythm: Regular rhythm.     Heart sounds: Normal heart sounds.  Pulmonary:     Effort: Pulmonary effort is normal.     Breath sounds: Normal breath sounds.  Chest:  Breasts:    Breasts are symmetrical.     Right: Normal. No inverted nipple, mass, nipple discharge, skin change or tenderness.     Left: Normal. No inverted nipple, mass, nipple discharge, skin change or tenderness.  Abdominal:     General: Abdomen is flat. Bowel sounds are normal.     Palpations: Abdomen is soft.  Genitourinary:    General: Normal vulva.     Vagina: Normal. No vaginal discharge, bleeding or lesions.     Cervix: Normal. No discharge or lesion.     Comments: Unable to assess uterus and adnexa due to habitus Lymphadenopathy:     Upper Body:     Right upper body: No axillary adenopathy.     Left upper body: No axillary adenopathy.  Skin:    General: Skin is warm and dry.  Neurological:     Mental Status: She is alert and oriented to person, place, and time.  Psychiatric:        Mood and Affect: Mood normal.        Thought  Content: Thought content normal.        Judgment: Judgment normal.    [name], CMA present for exam  Assessment/Plan:   1. Well woman exam with routine gynecological exam (Primary) - Cytology - PAP( [facility])  2. Depression screen Positive, managed by PCP, on meds    [name] B WHNP-BC 11:40 AM 12/07/2023

## 2023-12-09 ENCOUNTER — Ambulatory Visit: Payer: Self-pay | Admitting: Radiology

## 2023-12-09 LAB — CYTOLOGY - PAP
Adequacy: ABSENT
Comment: NEGATIVE
Diagnosis: NEGATIVE
High risk HPV: NEGATIVE

## 2023-12-14 ENCOUNTER — Encounter: Payer: Self-pay | Admitting: Family Medicine

## 2023-12-14 ENCOUNTER — Ambulatory Visit: Admitting: Family Medicine

## 2023-12-14 VITALS — BP 132/70 | HR 91 | Temp 97.6°F | Ht 65.0 in | Wt 300.0 lb

## 2023-12-14 DIAGNOSIS — E1165 Type 2 diabetes mellitus with hyperglycemia: Secondary | ICD-10-CM

## 2023-12-14 DIAGNOSIS — I152 Hypertension secondary to endocrine disorders: Secondary | ICD-10-CM | POA: Diagnosis not present

## 2023-12-14 DIAGNOSIS — G4733 Obstructive sleep apnea (adult) (pediatric): Secondary | ICD-10-CM | POA: Diagnosis not present

## 2023-12-14 DIAGNOSIS — E1159 Type 2 diabetes mellitus with other circulatory complications: Secondary | ICD-10-CM

## 2023-12-14 NOTE — Patient Instructions (Signed)
 Continue your current medications and eating a diet low in sodium.  Continue monitoring your blood pressure at home.  Let me know if your readings are consistently higher than 130/80.

## 2023-12-14 NOTE — Progress Notes (Signed)
 Subjective:     Patient ID: April Gonzales, female    DOB: 07-27-1975, 48 y.o.   MRN: [id number]  Chief Complaint  Patient presents with   Medical Management of Chronic Issues    Checking bp at home, doing better 125-130s     HPI  Discussed the use of AI scribe software for clinical note transcription with the patient, who gave verbal consent to proceed.  History of Present Illness April Gonzales is a 48 year old female with uncontrolled diabetes and hypertension who presents for follow-up on chronic health conditions.  Hypertension - Blood pressure previously elevated, now improved to 125-130 mmHg after adjustment to valsartan  HCT 160/12.5 mg daily - Lower extremity edema decreased - Weight loss of two pounds since last visit  Hyperglycemia - Blood glucose levels remain elevated at 250-300 mg/dL, though improved compared to previous levels - Under endocrinology care with 24-hour urine test and additional blood tests ordered - Endocrinology follow-up scheduled in two weeks  Obstructive sleep apnea - Not using CPAP machine due to mask discomfort  Visual disturbance - Difficulty with small print - Purchased reading glasses - Plans to schedule an eye exam. She was referred.   Constitutional and cardiopulmonary symptoms - No chest pain, palpitations, or shortness of breath - No nausea, vomiting, or diarrhea     Health Maintenance Due  Topic Date Due   OPHTHALMOLOGY EXAM  Never done   HIV Screening  Never done   Diabetic kidney evaluation - Urine ACR  Never done   Hepatitis C Screening  Never done   Pneumococcal Vaccine (1 of 2 - PCV) Never done   Hepatitis B Vaccines 19-59 Average Risk (1 of 3 - 19+ 3-dose series) Never done   FOOT EXAM  09/09/2023   INFLUENZA VACCINE  11/18/2023    Past Medical History:  Diagnosis Date   Anemia    Anxiety    Arthritis    B12 deficiency    Back pain    Celiac disease    Chronic fatigue syndrome    Constipation     Depression    DM (diabetes mellitus) (HCC)    Gallbladder problem    GERD (gastroesophageal reflux disease)    Heartburn    History of kidney stones    Hypertension    Joint pain    Kidney stone    Pancreatitis    Psoriasis    SOBOE (shortness of breath on exertion)    Vitamin D  deficiency     Past Surgical History:  Procedure Laterality Date   CHOLECYSTECTOMY     CYSTOSCOPY/URETEROSCOPY/HOLMIUM LASER/STENT PLACEMENT Right 09/21/2022   Procedure: CYSTOSCOPY RIGHT URETEROSCOPY/HOLMIUM LASER/STENT PLACEMENT;  Surgeon: Nieves Cough, MD;  Location: [facility];  Service: Urology;  Laterality: Right;  75 MINS FOR CASE   SPINE SURGERY  2010   WISDOM TOOTH EXTRACTION      Family History  Problem Relation Age of Onset   Hyperlipidemia Mother    Diabetes Mother    Diabetes Mellitus II Mother    Hypertension Mother    Obesity Mother    Arthritis Mother    Obesity Father    Hypertension Father    CAD Father    Chronic Renal Failure Father    Hyperlipidemia Father    Heart disease Father    Kidney disease Father    Alcoholism Father    Schizophrenia Maternal Aunt    Psoriasis Maternal Aunt    Psoriasis Maternal Aunt    Diabetes  Paternal Aunt     Social History   Socioeconomic History   Marital status: Single    Spouse name: Not on file   Number of children: Not on file   Years of education: Not on file   Highest education level: Associate degree: occupational, Scientist, product/process development, or vocational program  Occupational History   Occupation: Currently on leave of absence  Tobacco Use   Smoking status: Never    Passive exposure: Never   Smokeless tobacco: Never  Vaping Use   Vaping status: Never Used  Substance and Sexual Activity   Alcohol use: No   Drug use: No   Sexual activity: Not Currently    Partners: Male    Birth control/protection: Abstinence    Comment: menarche 48yo, sexual debut 48yo  Other Topics Concern   Not on file  Social History Narrative   Not on file    Social Drivers of Health   Financial Resource Strain: Medium Risk (10/02/2023)   Overall Financial Resource Strain (CARDIA)    Difficulty of Paying Living Expenses: Somewhat hard  Food Insecurity: Food Insecurity Present (10/02/2023)   Hunger Vital Sign    Worried About Running Out of Food in the Last Year: Sometimes true    Ran Out of Food in the Last Year: Sometimes true  Transportation Needs: No Transportation Needs (10/02/2023)   PRAPARE - Administrator, Civil Service (Medical): No    Lack of Transportation (Non-Medical): No  Physical Activity: Inactive (10/02/2023)   Exercise Vital Sign    Days of Exercise per Week: 0 days    Minutes of Exercise per Session: Not on file  Stress: Stress Concern Present (10/02/2023)   [employer] of Occupational Health - Occupational Stress Questionnaire    Feeling of Stress: Rather much  Social Connections: Socially Isolated (10/02/2023)   Social Connection and Isolation Panel    Frequency of Communication with Friends and Family: Once a week    Frequency of Social Gatherings with Friends and Family: Once a week    Attends Religious Services: Never    Database administrator or Organizations: No    Attends Engineer, structural: Not on file    Marital Status: Never married  Intimate Partner Violence: Not on file    Outpatient Medications Prior to Visit  Medication Sig Dispense Refill   busPIRone (BUSPAR) 5 MG tablet Take 5 mg by mouth 2 (two) times daily.     Continuous Glucose Sensor (DEXCOM G7 SENSOR) MISC Inject 1 Application into the skin as directed. Change sensor every 10 days as directed. 9 each 3   Dulaglutide  (TRULICITY ) 1.5 MG/0.5ML SOAJ INJECT 1.5 MG SUBCUTANEOUSLY ONCE A WEEK 2 mL 2   escitalopram  (LEXAPRO ) 20 MG tablet Take 1 tablet (20 mg total) by mouth daily. 30 tablet 2   glipiZIDE  (GLUCOTROL  XL) 5 MG 24 hr tablet TAKE 1 TABLET BY MOUTH EVERY DAY WITH BREAKFAST 90 tablet 1   Insulin  Glargine (BASAGLAR   KWIKPEN) 100 UNIT/ML Inject 30 Units into the skin at bedtime. 30 mL 3   insulin  lispro (HUMALOG  KWIKPEN) 100 UNIT/ML KwikPen Inject 6-12 Units into the skin 3 (three) times daily. 36 mL 3   Insulin  Pen Needle (PEN NEEDLES) 31G X 8 MM MISC Use to inject insulin  4 times daily 300 each 3   metFORMIN  (GLUCOPHAGE ) 1000 MG tablet Take 1 tablet (1,000 mg total) by mouth 2 (two) times daily with a meal. 180 tablet 1   pregabalin (LYRICA) 75 MG  capsule Take 75 mg by mouth 2 (two) times daily as needed (pain).     Risankizumab-rzaa (SKYRIZI) 150 MG/ML SOSY Inject 150 mg into the skin every 3 (three) months.     Tapinarof (VTAMA) 1 % CREA Apply 1 Application topically daily as needed (psoriasis).     triamcinolone cream (KENALOG) 0.1 % Apply 1 application topically 2 (two) times daily as needed (psoriasis).      valsartan -hydrochlorothiazide  (DIOVAN -HCT) 160-12.5 MG tablet Take 1 tablet by mouth daily. 90 tablet 1   Vitamin D , Ergocalciferol , (DRISDOL ) 1.25 MG (50000 UNIT) CAPS capsule TAKE 1 CAPSULE (50,000 UNITS TOTAL) BY MOUTH once weekly 12 capsule 3   hydrocortisone -pramoxine (PROCTOFOAM-HC) rectal foam Place 1 applicator rectally 2 (two) times daily. (Patient not taking: Reported on 12/14/2023) 10 g 1   No facility-administered medications prior to visit.    Allergies  Allergen Reactions   Dilaudid [Hydromorphone Hcl] Nausea And Vomiting   Hydromorphone     Other reaction(s): Unknown   Methotrexate Other (See Comments)    Childhood reaction, foaming at the mouth    ROS See HPI    Objective:    Physical Exam Constitutional:      General: She is not in acute distress.    Appearance: She is obese. She is not ill-appearing.  Eyes:     Extraocular Movements: Extraocular movements intact.     Conjunctiva/sclera: Conjunctivae normal.  Cardiovascular:     Rate and Rhythm: Normal rate and regular rhythm.  Pulmonary:     Effort: Pulmonary effort is normal.     Breath sounds: Normal breath  sounds.  Musculoskeletal:     Cervical back: Normal range of motion and neck supple.     Right lower leg: No edema.     Left lower leg: No edema.  Skin:    General: Skin is warm and dry.  Neurological:     General: No focal deficit present.     Mental Status: She is alert and oriented to person, place, and time.     Cranial Nerves: No cranial nerve deficit.     Motor: No weakness.     Coordination: Coordination normal.  Psychiatric:        Mood and Affect: Mood normal.        Behavior: Behavior normal.        Thought Content: Thought content normal.      BP 132/70   Pulse 91   Temp 97.6 F (36.4 C) (Temporal)   Ht 5' 5 (1.651 m)   Wt 300 lb (136.1 kg)   LMP 11/08/2023 (Approximate)   SpO2 98%   BMI 49.92 kg/m  Wt Readings from Last 3 Encounters:  12/14/23 300 lb (136.1 kg)  12/07/23 (!) 302 lb (137 kg)  10/06/23 (!) 303 lb (137.4 kg)       Assessment & Plan:   Problem List Items Addressed This Visit     Hypertension associated with diabetes (HCC) - Primary   OSA (obstructive sleep apnea)   Uncontrolled type 2 diabetes mellitus with hyperglycemia (HCC)    Assessment and Plan Assessment & Plan Essential hypertension with lower extremity edema Hypertension previously uncontrolled, now improved with valsartan  HCT 160/12.5 mg daily. Blood pressure readings at home are around 125-130 mmHg. Lower extremity edema has resolved with the addition of hydrochlorothiazide . - Continue valsartan  HCT 160/12.5 mg daily - Advise low sodium diet - Follow up in four months or sooner if blood pressure increases  Type 2 diabetes mellitus, uncontrolled  Blood sugar levels remain elevated, ranging from 250 to 300 mg/dL, though improved from previous levels. Endocrinology is managing diabetes, and further tests, including a 24-hour urine test, are planned. She is scheduled to see her endocrinologist on September 16th. - Complete endocrinologist-recommended tests, including 24-hour  urine test - Attend endocrinology appointment on September 16th - Continue current diabetes management plan  Morbid obesity Weight has decreased by two pounds since the last visit. Continues to see a diabetes nutritionist for dietary management. - Continue seeing diabetes nutritionist for dietary management  Obstructive sleep apnea Currently not using CPAP due to claustrophobia with the current mask. Plans to switch to nasal pillows to improve compliance. - Contact [facility] office to switch CPAP mask to ask about options - Ensure compliance with CPAP therapy     I am having April Gonzales maintain her triamcinolone cream, hydrocortisone -pramoxine, escitalopram , Skyrizi, busPIRone, pregabalin, Vtama, Vitamin D  (Ergocalciferol ), Pen Needles, metFORMIN , Basaglar  KwikPen, Dexcom G7 Sensor, insulin  lispro, glipiZIDE , Trulicity , and valsartan -hydrochlorothiazide .  No orders of the defined types were placed in this encounter.

## 2023-12-26 ENCOUNTER — Telehealth: Payer: Self-pay | Admitting: Nurse Practitioner

## 2023-12-26 ENCOUNTER — Other Ambulatory Visit: Payer: Self-pay | Admitting: *Deleted

## 2023-12-26 DIAGNOSIS — I10 Essential (primary) hypertension: Secondary | ICD-10-CM

## 2023-12-26 DIAGNOSIS — E1165 Type 2 diabetes mellitus with hyperglycemia: Secondary | ICD-10-CM

## 2023-12-26 DIAGNOSIS — E1169 Type 2 diabetes mellitus with other specified complication: Secondary | ICD-10-CM

## 2023-12-26 DIAGNOSIS — E559 Vitamin D deficiency, unspecified: Secondary | ICD-10-CM

## 2023-12-26 DIAGNOSIS — Z7984 Long term (current) use of oral hypoglycemic drugs: Secondary | ICD-10-CM

## 2023-12-26 DIAGNOSIS — Z7985 Long-term (current) use of injectable non-insulin antidiabetic drugs: Secondary | ICD-10-CM

## 2023-12-26 DIAGNOSIS — R6889 Other general symptoms and signs: Secondary | ICD-10-CM

## 2023-12-26 DIAGNOSIS — R7989 Other specified abnormal findings of blood chemistry: Secondary | ICD-10-CM

## 2023-12-26 NOTE — Telephone Encounter (Signed)
 Labs are updated.

## 2023-12-26 NOTE — Telephone Encounter (Signed)
 Update labs, pt is going tomorrow

## 2023-12-27 ENCOUNTER — Other Ambulatory Visit (HOSPITAL_COMMUNITY)
Admission: RE | Admit: 2023-12-27 | Discharge: 2023-12-27 | Disposition: A | Discharge status: Home / Self Care | Source: Ambulatory Visit | Attending: Nurse Practitioner | Admitting: Nurse Practitioner

## 2023-12-27 DIAGNOSIS — R7989 Other specified abnormal findings of blood chemistry: Secondary | ICD-10-CM | POA: Diagnosis not present

## 2023-12-27 DIAGNOSIS — Z7985 Long-term (current) use of injectable non-insulin antidiabetic drugs: Secondary | ICD-10-CM | POA: Diagnosis not present

## 2023-12-27 DIAGNOSIS — E1165 Type 2 diabetes mellitus with hyperglycemia: Secondary | ICD-10-CM | POA: Insufficient documentation

## 2023-12-27 DIAGNOSIS — R6889 Other general symptoms and signs: Secondary | ICD-10-CM | POA: Diagnosis present

## 2023-12-27 DIAGNOSIS — E559 Vitamin D deficiency, unspecified: Secondary | ICD-10-CM | POA: Diagnosis not present

## 2023-12-27 DIAGNOSIS — I10 Essential (primary) hypertension: Secondary | ICD-10-CM | POA: Diagnosis not present

## 2023-12-27 DIAGNOSIS — E1169 Type 2 diabetes mellitus with other specified complication: Secondary | ICD-10-CM | POA: Diagnosis not present

## 2023-12-27 DIAGNOSIS — Z7984 Long term (current) use of oral hypoglycemic drugs: Secondary | ICD-10-CM | POA: Insufficient documentation

## 2023-12-27 LAB — COMPREHENSIVE METABOLIC PANEL WITH GFR
ALT: 45 U/L — ABNORMAL HIGH (ref 0–44)
AST: 37 U/L (ref 15–41)
Albumin: 3.1 g/dL — ABNORMAL LOW (ref 3.5–5.0)
Alkaline Phosphatase: 95 U/L (ref 38–126)
Anion gap: 13 (ref 5–15)
BUN: 15 mg/dL (ref 6–20)
CO2: 23 mmol/L (ref 22–32)
Calcium: 9.1 mg/dL (ref 8.9–10.3)
Chloride: 98 mmol/L (ref 98–111)
Creatinine, Ser: 0.82 mg/dL (ref 0.44–1.00)
GFR, Estimated: >60 mL/min (ref >60)
Glucose, Bld: 242 mg/dL — ABNORMAL HIGH (ref 70–99)
Potassium: 4.2 mmol/L (ref 3.5–5.1)
Sodium: 134 mmol/L — ABNORMAL LOW (ref 135–145)
Total Bilirubin: 0.7 mg/dL (ref 0.0–1.2)
Total Protein: 7.7 g/dL (ref 6.5–8.1)

## 2023-12-27 LAB — T4, FREE: Free T4: 1.07 ng/dL (ref 0.61–1.12)

## 2023-12-27 LAB — TSH: TSH: 2.47 u[IU]/mL (ref 0.350–4.500)

## 2023-12-28 LAB — T3, FREE: T3, Free: 3.3 pg/mL (ref 2.0–4.4)

## 2023-12-29 LAB — CORTISOL, URINE, FREE
Cortisol (Ur), Free: 13 ug/(24.h) (ref 6–42)
Cortisol,F,ug/L,U: 11 ug/L
Total Volume: 1150

## 2024-01-03 ENCOUNTER — Encounter: Payer: Self-pay | Admitting: Nurse Practitioner

## 2024-01-03 ENCOUNTER — Ambulatory Visit (INDEPENDENT_AMBULATORY_CARE_PROVIDER_SITE_OTHER): Admitting: Nurse Practitioner

## 2024-01-03 VITALS — BP 130/72 | HR 86 | Ht 65.0 in | Wt 301.6 lb

## 2024-01-03 DIAGNOSIS — E1169 Type 2 diabetes mellitus with other specified complication: Secondary | ICD-10-CM

## 2024-01-03 DIAGNOSIS — I10 Essential (primary) hypertension: Secondary | ICD-10-CM

## 2024-01-03 DIAGNOSIS — Z7984 Long term (current) use of oral hypoglycemic drugs: Secondary | ICD-10-CM

## 2024-01-03 DIAGNOSIS — E559 Vitamin D deficiency, unspecified: Secondary | ICD-10-CM

## 2024-01-03 DIAGNOSIS — Z7985 Long-term (current) use of injectable non-insulin antidiabetic drugs: Secondary | ICD-10-CM | POA: Diagnosis not present

## 2024-01-03 DIAGNOSIS — E1165 Type 2 diabetes mellitus with hyperglycemia: Secondary | ICD-10-CM

## 2024-01-03 LAB — POCT GLYCOSYLATED HEMOGLOBIN (HGB A1C): Hemoglobin A1C: 11.5 % — AB (ref 4.0–5.6)

## 2024-01-03 MED ORDER — GLIPIZIDE ER 5 MG PO TB24: 5.0000 mg | ORAL_TABLET | Freq: Every day | ORAL | 3 refills | Status: DC

## 2024-01-03 MED ORDER — METFORMIN HCL 1000 MG PO TABS
1000.0000 mg | ORAL_TABLET | Freq: Two times a day (BID) | ORAL | 1 refills | Status: DC
Start: 2024-01-03 — End: 2024-02-02

## 2024-01-03 MED ORDER — TRULICITY 1.5 MG/0.5ML ~~LOC~~ SOAJ
1.5000 mg | SUBCUTANEOUS | 3 refills | Status: DC
Start: 2024-01-03 — End: 2024-02-02

## 2024-01-03 MED ORDER — DEXCOM G7 SENSOR MISC: 1.0000 | 3 refills | Status: DC

## 2024-01-03 MED ORDER — INSULIN LISPRO (1 UNIT DIAL) 100 UNIT/ML (KWIKPEN): 10.0000 [IU] | PEN_INJECTOR | Freq: Three times a day (TID) | SUBCUTANEOUS | 3 refills | Status: DC

## 2024-01-03 MED ORDER — PEN NEEDLES 31G X 8 MM MISC: 3 refills | Status: DC

## 2024-01-03 MED ORDER — LANTUS SOLOSTAR 100 UNIT/ML ~~LOC~~ SOPN: 50.0000 [IU] | PEN_INJECTOR | Freq: Every day | SUBCUTANEOUS | 3 refills | Status: DC

## 2024-01-03 NOTE — Progress Notes (Signed)
 Endocrinology Follow Up Note       01/03/2024, 8:56 AM   Subjective:    Patient ID: April Gonzales, female    DOB: [birth year].  April Gonzales is being seen in follow up after being seen in consultation for management of currently uncontrolled symptomatic diabetes requested by  Lendia Nordmann L, NP-C.   Past Medical History:  Diagnosis Date   Anemia    Anxiety    Arthritis    B12 deficiency    Back pain    Celiac disease    Chronic fatigue syndrome    Constipation    Depression    DM (diabetes mellitus) (HCC)    Gallbladder problem    GERD (gastroesophageal reflux disease)    Heartburn    History of kidney stones    Hypertension    Joint pain    Kidney stone    Pancreatitis    Psoriasis    SOBOE (shortness of breath on exertion)    Vitamin D  deficiency     Past Surgical History:  Procedure Laterality Date   CHOLECYSTECTOMY     CYSTOSCOPY/URETEROSCOPY/HOLMIUM LASER/STENT PLACEMENT Right 09/21/2022   Procedure: CYSTOSCOPY RIGHT URETEROSCOPY/HOLMIUM LASER/STENT PLACEMENT;  Surgeon: Nieves Cough, MD;  Location: [facility];  Service: Urology;  Laterality: Right;  75 MINS FOR CASE   SPINE SURGERY  2010   WISDOM TOOTH EXTRACTION      Social History   Socioeconomic History   Marital status: Single    Spouse name: Not on file   Number of children: Not on file   Years of education: Not on file   Highest education level: Associate degree: occupational, Scientist, product/process development, or vocational program  Occupational History   Occupation: Currently on leave of absence  Tobacco Use   Smoking status: Never    Passive exposure: Never   Smokeless tobacco: Never  Vaping Use   Vaping status: Never Used  Substance and Sexual Activity   Alcohol use: No   Drug use: No   Sexual activity: Not Currently    Partners: Male    Birth control/protection: Abstinence    Comment: menarche 48yo, sexual debut 48yo  Other Topics  Concern   Not on file  Social History Narrative   Not on file   Social Drivers of Health   Financial Resource Strain: Medium Risk (10/02/2023)   Overall Financial Resource Strain (CARDIA)    Difficulty of Paying Living Expenses: Somewhat hard  Food Insecurity: Food Insecurity Present (10/02/2023)   Hunger Vital Sign    Worried About Running Out of Food in the Last Year: Sometimes true    Ran Out of Food in the Last Year: Sometimes true  Transportation Needs: No Transportation Needs (10/02/2023)   PRAPARE - Administrator, Civil Service (Medical): No    Lack of Transportation (Non-Medical): No  Physical Activity: Inactive (10/02/2023)   Exercise Vital Sign    Days of Exercise per Week: 0 days    Minutes of Exercise per Session: Not on file  Stress: Stress Concern Present (10/02/2023)   [employer] of Occupational Health - Occupational Stress Questionnaire    Feeling of Stress: Rather much  Social Connections: Socially Isolated (  10/02/2023)   Social Connection and Isolation Panel    Frequency of Communication with Friends and Family: Once a week    Frequency of Social Gatherings with Friends and Family: Once a week    Attends Religious Services: Never    Database administrator or Organizations: No    Attends Engineer, structural: Not on file    Marital Status: Never married    Family History  Problem Relation Age of Onset   Hyperlipidemia Mother    Diabetes Mother    Diabetes Mellitus II Mother    Hypertension Mother    Obesity Mother    Arthritis Mother    Obesity Father    Hypertension Father    CAD Father    Chronic Renal Failure Father    Hyperlipidemia Father    Heart disease Father    Kidney disease Father    Alcoholism Father    Schizophrenia Maternal Aunt    Psoriasis Maternal Aunt    Psoriasis Maternal Aunt    Diabetes Paternal Aunt     Outpatient Encounter Medications as of 01/03/2024  Medication Sig   busPIRone (BUSPAR) 5 MG  tablet Take 5 mg by mouth 2 (two) times daily.   escitalopram  (LEXAPRO ) 20 MG tablet Take 1 tablet (20 mg total) by mouth daily.   insulin  glargine (LANTUS  SOLOSTAR) 100 UNIT/ML Solostar Pen Inject 50 Units into the skin at bedtime.   pregabalin (LYRICA) 75 MG capsule Take 75 mg by mouth 2 (two) times daily as needed (pain).   Risankizumab-rzaa (SKYRIZI) 150 MG/ML SOSY Inject 150 mg into the skin every 3 (three) months.   Tapinarof (VTAMA) 1 % CREA Apply 1 Application topically daily as needed (psoriasis).   triamcinolone cream (KENALOG) 0.1 % Apply 1 application topically 2 (two) times daily as needed (psoriasis).    valsartan -hydrochlorothiazide  (DIOVAN -HCT) 160-12.5 MG tablet Take 1 tablet by mouth daily.   Vitamin D , Ergocalciferol , (DRISDOL ) 1.25 MG (50000 UNIT) CAPS capsule TAKE 1 CAPSULE (50,000 UNITS TOTAL) BY MOUTH once weekly   [DISCONTINUED] Continuous Glucose Sensor (DEXCOM G7 SENSOR) MISC Inject 1 Application into the skin as directed. Change sensor every 10 days as directed.   [DISCONTINUED] Dulaglutide  (TRULICITY ) 1.5 MG/0.5ML SOAJ INJECT 1.5 MG SUBCUTANEOUSLY ONCE A WEEK   [DISCONTINUED] glipiZIDE  (GLUCOTROL  XL) 5 MG 24 hr tablet TAKE 1 TABLET BY MOUTH EVERY DAY WITH BREAKFAST   [DISCONTINUED] Insulin  Glargine (BASAGLAR  KWIKPEN) 100 UNIT/ML Inject 30 Units into the skin at bedtime.   [DISCONTINUED] insulin  lispro (HUMALOG  KWIKPEN) 100 UNIT/ML KwikPen Inject 6-12 Units into the skin 3 (three) times daily.   [DISCONTINUED] Insulin  Pen Needle (PEN NEEDLES) 31G X 8 MM MISC Use to inject insulin  4 times daily   [DISCONTINUED] metFORMIN  (GLUCOPHAGE ) 1000 MG tablet Take 1 tablet (1,000 mg total) by mouth 2 (two) times daily with a meal.   Continuous Glucose Sensor (DEXCOM G7 SENSOR) MISC Inject 1 Application into the skin as directed. Change sensor every 10 days as directed.   Dulaglutide  (TRULICITY ) 1.5 MG/0.5ML SOAJ Inject 1.5 mg into the skin once a week.   glipiZIDE  (GLUCOTROL  XL) 5 MG  24 hr tablet Take 1 tablet (5 mg total) by mouth daily with breakfast.   hydrocortisone -pramoxine (PROCTOFOAM-HC) rectal foam Place 1 applicator rectally 2 (two) times daily. (Patient not taking: Reported on 01/03/2024)   insulin  lispro (HUMALOG  KWIKPEN) 100 UNIT/ML KwikPen Inject 10-16 Units into the skin 3 (three) times daily.   Insulin  Pen Needle (PEN NEEDLES) 31G X 8  MM MISC Use to inject insulin  4 times daily   metFORMIN  (GLUCOPHAGE ) 1000 MG tablet Take 1 tablet (1,000 mg total) by mouth 2 (two) times daily with a meal.   No facility-administered encounter medications on file as of 01/03/2024.    ALLERGIES: Allergies  Allergen Reactions   Dilaudid [Hydromorphone Hcl] Nausea And Vomiting   Hydromorphone     Other reaction(s): Unknown   Methotrexate Other (See Comments)    Childhood reaction, foaming at the mouth    VACCINATION STATUS: Immunization History  Administered Date(s) Administered   Dtap, Unspecified 02/18/1975, 05/21/1975, 10/18/1975, 10/17/1976   Fluzone Influenza virus vaccine,trivalent (IIV3), split virus 05/01/2020   Influenza, Seasonal, Injecte, Preservative Fre 02/28/2013, 12/29/2022   Influenza,inj,Quad PF,6+ Mos 12/24/2021   Influenza-Unspecified 01/12/2009   MMR 07/18/1976, 05/06/1992, 11/11/1992   PFIZER(Purple Top)SARS-COV-2 Vaccination 07/31/2019, 08/28/2019, 05/01/2020   Pfizer Covid-19 Vaccine Bivalent Booster 22yrs & up 06/11/2021   Polio, Unspecified 02/18/1975, 05/21/1975, 10/18/1975, 10/17/1976   Tdap 07/21/2009, 10/11/2020    Diabetes She presents for her follow-up diabetic visit. She has type 2 diabetes mellitus. Onset time: formally diagnosed at age 61. Her disease course has been stable. There are no hypoglycemic associated symptoms. Associated symptoms include fatigue, polydipsia and polyuria. There are no hypoglycemic complications. Symptoms are stable. There are no diabetic complications. Risk factors for coronary artery disease include diabetes  mellitus, dyslipidemia, obesity, hypertension and sedentary lifestyle. Current diabetic treatment includes oral agent (triple therapy) and insulin  injections (and Trulicity ). She is compliant with treatment most of the time (ran out of insulin  2 weeks ago). Her weight is stable. She is following a generally unhealthy diet. When asked about meal planning, she reported none. She has not had a previous visit with a dietitian. She rarely participates in exercise. Her home blood glucose trend is fluctuating minimally. Her breakfast blood glucose range is generally >200 mg/dl. Her lunch blood glucose range is generally >200 mg/dl. Her dinner blood glucose range is generally >200 mg/dl. Her bedtime blood glucose range is generally >200 mg/dl. Her overall blood glucose range is >200 mg/dl. (She presents today with her CGM showing gross hyperglycemia overall.  Her POCT A1c today is 11.5%, increasing from last visit of 11%.  Analysis of her CGM shows TIR 0%, TAR 100% (97% in level 2 hyperglycemia range), TBR 0% with a GMI of 11.7%.  She continues to eat a diet high in saturated fats which is contributing to insulin  resistance. ) An ACE inhibitor/angiotensin II receptor blocker is being taken. She does not see a podiatrist.Eye exam is not current.     Review of systems  Constitutional: + Minimally fluctuating body weight, current Body mass index is 50.19 kg/m., no fatigue, no subjective hyperthermia, no subjective hypothermia Eyes: no blurry vision, no xerophthalmia ENT: no sore throat, no nodules palpated in throat, no dysphagia/odynophagia, no hoarseness Cardiovascular: no chest pain, no shortness of breath, no palpitations, no leg swelling Respiratory: no cough, no shortness of breath Gastrointestinal: no nausea/vomiting/diarrhea Musculoskeletal: no muscle/joint aches Skin: + dry flaky skin to BLE, + hyperemia to BLE Neurological: no tremors, no numbness, no tingling, no dizziness Psychiatric: no depression,  no anxiety  Objective:     BP 130/72 (BP Location: Left Arm, Patient Position: Sitting, Cuff Size: Large)   Pulse 86   Ht 5' 5 (1.651 m)   Wt (!) 301 lb 9.6 oz (136.8 kg)   LMP 11/08/2023 (Approximate)   BMI 50.19 kg/m   Wt Readings from Last 3 Encounters:  01/03/24 ROLLEN)  301 lb 9.6 oz (136.8 kg)  12/14/23 300 lb (136.1 kg)  12/07/23 (!) 302 lb (137 kg)     BP Readings from Last 3 Encounters:  01/03/24 130/72  12/14/23 132/70  12/07/23 (!) 142/76     Physical Exam- Limited  Constitutional:  Body mass index is 50.19 kg/m. , not in acute distress, normal state of mind, + cushingoid appearance with dorsocervical fat pad present and moon face Eyes:  EOMI, no exophthalmos Musculoskeletal: no gross deformities, strength intact in all four extremities, no gross restriction of joint movements Skin:  + dry flaky skin to BLE, + hyperemia to BLE Neurological: no tremor with outstretched hands   Diabetic Foot Exam - Simple   Simple Foot Form Diabetic Foot exam was performed with the following findings: Yes 01/03/2024  8:51 AM  Visual Inspection See comments: Yes Sensation Testing Intact to touch and monofilament testing bilaterally: Yes Pulse Check See comments: Yes Comments Dry flaky skin bilaterally; calluses and mild onychomycosis bilaterally; diminished pulses bilaterally     CMP ( most recent) CMP     Component Value Date/Time   NA 134 (L) 12/27/2023 1103   K 4.2 12/27/2023 1103   CL 98 12/27/2023 1103   CO2 23 12/27/2023 1103   GLUCOSE 242 (H) 12/27/2023 1103   BUN 15 12/27/2023 1103   CREATININE 0.82 12/27/2023 1103   CALCIUM 9.1 12/27/2023 1103   PROT 7.7 12/27/2023 1103   ALBUMIN 3.1 (L) 12/27/2023 1103   AST 37 12/27/2023 1103   ALT 45 (H) 12/27/2023 1103   ALKPHOS 95 12/27/2023 1103   BILITOT 0.7 12/27/2023 1103   GFRNONAA >60 12/27/2023 1103   GFRAA >60 04/06/2019 1242     Diabetic Labs (most recent): Lab Results  Component Value Date   HGBA1C  11.5 (A) 01/03/2024   HGBA1C 11.0 (A) 06/23/2023   HGBA1C 8.1 (A) 02/09/2023     Lipid Panel ( most recent) Lipid Panel     Component Value Date/Time   CHOL 105 12/24/2021 1052   TRIG 119.0 12/24/2021 1052   HDL 38.60 (L) 12/24/2021 1052   CHOLHDL 3 12/24/2021 1052   VLDL 23.8 12/24/2021 1052   LDLCALC 42 12/24/2021 1052      Lab Results  Component Value Date   TSH 2.470 12/27/2023   TSH 2.10 10/06/2023   TSH 3.631 04/28/2023   TSH 1.94 05/27/2022   TSH 2.11 02/26/2022   TSH 1.95 11/19/2021   FREET4 1.07 12/27/2023   FREET4 0.94 04/28/2023   FREET4 1.10 11/19/2021           Assessment & Plan:   1) Type 2 diabetes mellitus with hyperglycemia, without long-term current use of insulin   She presents today with her CGM showing gross hyperglycemia overall.  Her POCT A1c today is 11.5%, increasing from last visit of 11%.  Analysis of her CGM shows TIR 0%, TAR 100% (97% in level 2 hyperglycemia range), TBR 0% with a GMI of 11.7%.  She continues to eat a diet high in saturated fats which is contributing to insulin  resistance.   - April Gonzales has currently uncontrolled symptomatic type 2 DM since 48 years of age.   -Recent labs reviewed.  - I had a long discussion with her about the progressive nature of diabetes and the pathology behind its complications. -her diabetes is complicated by pancreatitis (on 2 separate occasions) and she remains at a high risk for more acute and chronic complications which include CAD, CVA, CKD, retinopathy, and  neuropathy. These are all discussed in detail with her.  The following Lifestyle Medicine recommendations according to American College of Lifestyle Medicine [facility]) were discussed and offered to patient and she agrees to start the journey:  A. Whole Foods, Plant-based plate comprising of fruits and vegetables, plant-based proteins, whole-grain carbohydrates was discussed in detail with the patient.   A list for source of those nutrients  were also provided to the patient.  Patient will use only water or unsweetened tea for hydration. B.  The need to stay away from risky substances including alcohol, smoking; obtaining 7 to 9 hours of restorative sleep, at least 150 minutes of moderate intensity exercise weekly, the importance of healthy social connections,  and stress reduction techniques were discussed. C.  A full color page of  Calorie density of various food groups per pound showing examples of each food groups was provided to the patient.  - Nutritional counseling repeated at each appointment due to patients tendency to fall back in to old habits.  - The patient admits there is a room for improvement in their diet and drink choices. -  Suggestion is made for the patient to avoid simple carbohydrates from their diet including Cakes, Sweet Desserts / Pastries, Ice Cream, Soda (diet and regular), Sweet Tea, Candies, Chips, Cookies, Sweet Pastries, Store Bought Juices, Alcohol in Excess of 1-2 drinks a day, Artificial Sweeteners, Coffee Creamer, and Sugar-free Products. This will help patient to have stable blood glucose profile and potentially avoid unintended weight gain.   - I encouraged the patient to switch to unprocessed or minimally processed complex starch and increased protein intake (animal or plant source), fruits, and vegetables.   - Patient is advised to stick to a routine mealtimes to eat 3 meals a day and avoid unnecessary snacks (to snack only to correct hypoglycemia).  - she is following with Penny Crumpton, RDN, CDE for diabetes education.  - I have approached her with the following individualized plan to manage her diabetes and patient agrees:   - she is advised to continue Metformin  1000 mg po twice daily with meals and Glipizide  5 mg XL po daily, therapeutically suitable for patient.  She can also continue Trulicity  1.5 mg SQ weekly (will not increase due to risk of pancreatitis).   She is advised to  increase Basaglar  (will change to Lantus  due to insurance pref) to 50 units SQ nightly and increase her Novolog  to 10-16 units TID with meals if glucose is above 90 and she is eating (Specific instructions on how to titrate insulin  dosage based on glucose readings given to patient in writing).   --she is encouraged to continue monitoring glucose 4 times daily (using her CGM), before meals and before bed, to and to call the clinic if she has readings less than 70 or above 300 for 3 tests in a row.  - Adjustment parameters are given to her for hypo and hyperglycemia in writing.  - her Farxiga  was previously discontinued, risk outweighs benefit for this patient- has had yeast and UTI infections since starting this medication.  - she is not an ideal candidate for incretin therapy given her history of pancreatitis on 2 separate occasions in the past.  However, she has been on Trulicity  1.5 mg SQ weekly and tolerating it well, therefore she can continue it for now.    - Specific targets for  A1c; LDL, HDL, and Triglycerides were discussed with the patient.  2) Blood Pressure /Hypertension:  her blood pressure is  controlled to target.   she is advised to continue her current medications including Lisinopril  40 mg p.o. daily with breakfast.  3) Lipids/Hyperlipidemia:    Review of her recent lipid panel from 12/24/21 showed controlled LDL at 42 . She is not currently on any lipid lowering medications.  4)  Weight/Diet:  her Body mass index is 50.19 kg/m.  -  clearly complicating her diabetes care.   she is a candidate for weight loss. I discussed with her the fact that loss of 5 - 10% of her  current body weight will have the most impact on her diabetes management.  Exercise, and detailed carbohydrates information provided  -  detailed on discharge instructions.  5) Chronic Care/Health Maintenance: -she is on ACEI/ARB and not on Statin medications and is encouraged to initiate and continue to follow up  with Ophthalmology, Dentist, Podiatrist at least yearly or according to recommendations, and advised to stay away from smoking. I have recommended yearly flu vaccine and pneumonia vaccine at least every 5 years; moderate intensity exercise for up to 150 minutes weekly; and sleep for at least 7 hours a day.  6) Cushingoid appearance Will check am-cortisol and urine cortisol level to assess for possible cushings disease.  If labs return abnormal, she is aware that her care will be transferred to Dr. Lenis for further evaluation and treatment.  Cortisol level was normal, urine cortisol was also normal, ruling out Cushings.  7) Vitamin D  deficiency Her recent vitamin D  level was low at 19.2.  She is currently on Ergocalciferol  50000 units weekly.  - she is advised to maintain close follow up with Lendia Boby CROME, NP-C for primary care needs, as well as her other providers for optimal and coordinated care.  8) Abnormal TSH  -Her thyroid  antibodies were negative ruling out autoimmune thyroid  dysfunction but her TSH and Free T4 were a tad closer to the under-active side.  In reviewing history of thyroid  labs, she has a tendency to fluctuate.  Repeat thyroid  function tests were normal, she does not need any hormone replacement at this time.     I spent  42  minutes in the care of the patient today including review of labs from CMP, Lipids, Thyroid  Function, Hematology (current and previous including abstractions from other facilities); face-to-face time discussing  her blood glucose readings/logs, discussing hypoglycemia and hyperglycemia episodes and symptoms, medications doses, her options of short and long term treatment based on the latest standards of care / guidelines;  discussion about incorporating lifestyle medicine;  and documenting the encounter. Risk reduction counseling performed per USPSTF guidelines to reduce obesity and cardiovascular risk factors.     Please refer to Patient Instructions  for Blood Glucose Monitoring and Insulin /Medications Dosing Guide  in media tab for additional information. Please  also refer to  Patient Self Inventory in the Media  tab for reviewed elements of pertinent patient history.  April Gonzales participated in the discussions, expressed understanding, and voiced agreement with the above plans.  All questions were answered to her satisfaction. she is encouraged to contact clinic should she have any questions or concerns prior to her return visit.     Follow up plan: - Return in about 3 months (around 04/03/2024) for Diabetes F/U- A1c and UM in office, No previsit labs, Bring meter and logs.   Benton Rio, [facility] [address] [place], [place] [address] Phone: [phone] Fax: [phone]  01/03/2024, 8:56 AM

## 2024-02-01 ENCOUNTER — Encounter: Payer: Self-pay | Admitting: Nurse Practitioner

## 2024-02-01 DIAGNOSIS — E1165 Type 2 diabetes mellitus with hyperglycemia: Secondary | ICD-10-CM

## 2024-02-01 DIAGNOSIS — E559 Vitamin D deficiency, unspecified: Secondary | ICD-10-CM

## 2024-02-01 DIAGNOSIS — E1169 Type 2 diabetes mellitus with other specified complication: Secondary | ICD-10-CM

## 2024-02-02 MED ORDER — DEXCOM G7 SENSOR MISC: 1.0000 | 3 refills | Status: DC

## 2024-02-02 MED ORDER — METFORMIN HCL 1000 MG PO TABS: 1000.0000 mg | ORAL_TABLET | Freq: Two times a day (BID) | ORAL | 1 refills | Status: DC

## 2024-02-02 MED ORDER — TRULICITY 1.5 MG/0.5ML ~~LOC~~ SOAJ: 1.5000 mg | SUBCUTANEOUS | 3 refills | Status: DC

## 2024-02-02 MED ORDER — PEN NEEDLES 31G X 8 MM MISC: 3 refills | Status: DC

## 2024-02-02 MED ORDER — GLIPIZIDE ER 5 MG PO TB24: 5.0000 mg | ORAL_TABLET | Freq: Every day | ORAL | 3 refills | Status: DC

## 2024-02-02 MED ORDER — VITAMIN D (ERGOCALCIFEROL) 1.25 MG (50000 UNIT) PO CAPS: ORAL_CAPSULE | ORAL | 3 refills | Status: AC

## 2024-02-02 MED ORDER — INSULIN LISPRO (1 UNIT DIAL) 100 UNIT/ML (KWIKPEN): 10.0000 [IU] | PEN_INJECTOR | Freq: Three times a day (TID) | SUBCUTANEOUS | 3 refills | Status: DC

## 2024-02-02 MED ORDER — LANTUS SOLOSTAR 100 UNIT/ML ~~LOC~~ SOPN: 50.0000 [IU] | PEN_INJECTOR | Freq: Every day | SUBCUTANEOUS | 3 refills | Status: DC

## 2024-02-06 ENCOUNTER — Encounter: Payer: Self-pay | Admitting: Family Medicine

## 2024-02-06 ENCOUNTER — Other Ambulatory Visit (HOSPITAL_COMMUNITY): Payer: Self-pay

## 2024-02-06 ENCOUNTER — Telehealth: Payer: Self-pay

## 2024-02-06 NOTE — Telephone Encounter (Signed)
 Pt notified via mychart

## 2024-02-06 NOTE — Telephone Encounter (Signed)
 See patient message about PA needed for Dexcom sensors.  Thanks

## 2024-02-06 NOTE — Telephone Encounter (Signed)
 Pharmacy Patient Advocate Encounter   Received notification from Patient Advice Request messages that prior authorization for Trulicity  1.5mg /0.69ml  is required/requested.   Insurance verification completed.   The patient is insured through [employer].   Per test claim: PA required; PA submitted to above mentioned insurance via Latent Key/confirmation #/EOC [facility] Status is pending

## 2024-02-06 NOTE — Telephone Encounter (Signed)
 Pharmacy Patient Advocate Encounter  Received notification from [employer] that Prior Authorization for Trulicity  1.5mg /0.30ml has been APPROVED from 02/06/24 to 02/05/25. Ran test claim, Copay is $4. This test claim was processed through [facility]- copay amounts may vary at other pharmacies due to pharmacy/plan contracts, or as the patient moves through the different stages of their insurance plan.   PA #/Case ID/Reference #: [id number]  Left a message at [facility] to notify of the approval.

## 2024-02-07 ENCOUNTER — Telehealth: Payer: Self-pay

## 2024-02-07 ENCOUNTER — Other Ambulatory Visit (HOSPITAL_COMMUNITY): Payer: Self-pay

## 2024-02-07 NOTE — Telephone Encounter (Signed)
 Pharmacy Patient Advocate Encounter   Received notification from Patient Advice Request messages that prior authorization for Dexcom G7 sensor is required/requested.  Insurance verification completed.   The patient is insured through [employer].   Per test claim: PA required; PA submitted to above mentioned insurance via Latent Key/confirmation #/EOC [id number] Status is pending

## 2024-02-07 NOTE — Telephone Encounter (Signed)
 Patient was called and made aware.

## 2024-02-07 NOTE — Telephone Encounter (Signed)
 Pharmacy Patient Advocate Encounter  Received notification from [employer] that Prior Authorization for  Dexcom G7 sensor  has been APPROVED from 02-07-2024 to 08-05-2024   PA #/Case ID/Reference #: [id number]

## 2024-03-05 ENCOUNTER — Encounter: Payer: Self-pay | Admitting: Podiatry

## 2024-03-05 ENCOUNTER — Ambulatory Visit: Admitting: Podiatry

## 2024-03-05 DIAGNOSIS — E1169 Type 2 diabetes mellitus with other specified complication: Secondary | ICD-10-CM

## 2024-03-05 DIAGNOSIS — M79674 Pain in right toe(s): Secondary | ICD-10-CM | POA: Diagnosis not present

## 2024-03-05 DIAGNOSIS — M79675 Pain in left toe(s): Secondary | ICD-10-CM

## 2024-03-05 DIAGNOSIS — B351 Tinea unguium: Secondary | ICD-10-CM

## 2024-03-05 NOTE — Progress Notes (Signed)
 This patient returns to my office for at risk foot care.  This patient requires this care by a professional since this patient will be at risk due to having type 2 diabetes and neuropathy.  This patient is unable to cut nails herself since the patient cannot reach her nails.These nails are painful walking and wearing shoes.  This patient presents for at risk foot care today.  General Appearance  Alert, conversant and in no acute stress.  Vascular  Dorsalis pedis and posterior tibial  pulses are palpable  bilaterally.  Capillary return is within normal limits  bilaterally. Temperature is within normal limits  bilaterally.  Neurologic  Senn-Weinstein monofilament wire test within normal limits  bilaterally. Muscle power within normal limits bilaterally.  Nails Thick disfigured discolored nails with subungual debris  from hallux to fifth toes bilaterally. No evidence of bacterial infection or drainage bilaterally.  Orthopedic  No limitations of motion  feet .  No crepitus or effusions noted.  No bony pathology or digital deformities noted.  Skin  normotropic skin with no porokeratosis noted bilaterally.  No signs of infections or ulcers noted.     Onychomycosis  Pain in right toes  Pain in left toes  Consent was obtained for treatment procedures.   Mechanical debridement of nails 1-5  bilaterally performed with a nail nipper.  Filed with dremel without incident.    Return office visit    4 months                  Told patient to return for periodic foot care and evaluation due to potential at risk complications.   Cordella Bold DPM

## 2024-03-12 ENCOUNTER — Encounter: Payer: Self-pay | Admitting: Family Medicine

## 2024-03-12 NOTE — Telephone Encounter (Signed)
 Please advise

## 2024-04-03 ENCOUNTER — Encounter: Payer: Self-pay | Admitting: Nurse Practitioner

## 2024-04-03 ENCOUNTER — Ambulatory Visit: Admitting: Nurse Practitioner

## 2024-04-03 VITALS — BP 110/82 | HR 76 | Ht 65.0 in | Wt 301.8 lb

## 2024-04-03 DIAGNOSIS — I10 Essential (primary) hypertension: Secondary | ICD-10-CM | POA: Diagnosis not present

## 2024-04-03 DIAGNOSIS — E1165 Type 2 diabetes mellitus with hyperglycemia: Secondary | ICD-10-CM | POA: Diagnosis not present

## 2024-04-03 DIAGNOSIS — Z7985 Long-term (current) use of injectable non-insulin antidiabetic drugs: Secondary | ICD-10-CM

## 2024-04-03 DIAGNOSIS — E1169 Type 2 diabetes mellitus with other specified complication: Secondary | ICD-10-CM

## 2024-04-03 DIAGNOSIS — E559 Vitamin D deficiency, unspecified: Secondary | ICD-10-CM

## 2024-04-03 DIAGNOSIS — Z7984 Long term (current) use of oral hypoglycemic drugs: Secondary | ICD-10-CM

## 2024-04-03 LAB — POCT GLYCOSYLATED HEMOGLOBIN (HGB A1C): Hemoglobin A1C: 12.1 % — AB (ref 4.0–5.6)

## 2024-04-03 MED ORDER — PEN NEEDLES 31G X 8 MM MISC: 3 refills | Status: AC

## 2024-04-03 MED ORDER — GLIPIZIDE ER 5 MG PO TB24: 5.0000 mg | ORAL_TABLET | Freq: Every day | ORAL | 3 refills | Status: AC

## 2024-04-03 MED ORDER — METFORMIN HCL 1000 MG PO TABS: 1000.0000 mg | ORAL_TABLET | Freq: Two times a day (BID) | ORAL | 1 refills | Status: AC

## 2024-04-03 MED ORDER — INSULIN LISPRO (1 UNIT DIAL) 100 UNIT/ML (KWIKPEN): 15.0000 [IU] | PEN_INJECTOR | Freq: Three times a day (TID) | SUBCUTANEOUS | 3 refills | Status: AC

## 2024-04-03 MED ORDER — TRULICITY 1.5 MG/0.5ML ~~LOC~~ SOAJ: 1.5000 mg | SUBCUTANEOUS | 3 refills | Status: AC

## 2024-04-03 MED ORDER — DEXCOM G7 SENSOR MISC: 1.0000 | 3 refills | Status: AC

## 2024-04-03 MED ORDER — LANTUS SOLOSTAR 100 UNIT/ML ~~LOC~~ SOPN: 60.0000 [IU] | PEN_INJECTOR | Freq: Every day | SUBCUTANEOUS | 3 refills | Status: AC

## 2024-04-03 NOTE — Progress Notes (Signed)
 Endocrinology Follow Up Note       04/03/2024, 8:49 AM   Subjective:    Patient ID: April Gonzales, female    DOB: 12/07/1975.  April Gonzales is being seen in follow up after being seen in consultation for management of currently uncontrolled symptomatic diabetes requested by  Lendia Nordmann L, NP-C.   Past Medical History:  Diagnosis Date   Anemia    Anxiety    Arthritis    B12 deficiency    Back pain    Celiac disease    Chronic fatigue syndrome    Constipation    Depression    DM (diabetes mellitus) (HCC)    Gallbladder problem    GERD (gastroesophageal reflux disease)    Heartburn    History of kidney stones    Hypertension    Joint pain    Kidney stone    Pancreatitis    Psoriasis    SOBOE (shortness of breath on exertion)    Vitamin D  deficiency     Past Surgical History:  Procedure Laterality Date   CHOLECYSTECTOMY     CYSTOSCOPY/URETEROSCOPY/HOLMIUM LASER/STENT PLACEMENT Right 09/21/2022   Procedure: CYSTOSCOPY RIGHT URETEROSCOPY/HOLMIUM LASER/STENT PLACEMENT;  Surgeon: Nieves Cough, MD;  Location: [facility];  Service: Urology;  Laterality: Right;  75 MINS FOR CASE   SPINE SURGERY  2010   WISDOM TOOTH EXTRACTION      Social History   Socioeconomic History   Marital status: Single    Spouse name: Not on file   Number of children: Not on file   Years of education: Not on file   Highest education level: Associate degree: occupational, scientist, product/process development, or vocational program  Occupational History   Occupation: Currently on leave of absence  Tobacco Use   Smoking status: Never    Passive exposure: Never   Smokeless tobacco: Never  Vaping Use   Vaping status: Never Used  Substance and Sexual Activity   Alcohol use: No   Drug use: No   Sexual activity: Not Currently    Partners: Male    Birth control/protection: Abstinence    Comment: menarche 48yo, sexual debut 48yo  Other Topics  Concern   Not on file  Social History Narrative   Not on file   Social Drivers of Health   Tobacco Use: Low Risk (04/03/2024)   Patient History    Smoking Tobacco Use: Never    Smokeless Tobacco Use: Never    Passive Exposure: Never  Financial Resource Strain: Medium Risk (10/02/2023)   Overall Financial Resource Strain (CARDIA)    Difficulty of Paying Living Expenses: Somewhat hard  Food Insecurity: Food Insecurity Present (10/02/2023)   Epic    Worried About Programme Researcher, Broadcasting/film/video in the Last Year: Sometimes true    Ran Out of Food in the Last Year: Sometimes true  Transportation Needs: No Transportation Needs (10/02/2023)   Epic    Lack of Transportation (Medical): No    Lack of Transportation (Non-Medical): No  Physical Activity: Inactive (10/02/2023)   Exercise Vital Sign    Days of Exercise per Week: 0 days    Minutes of Exercise per Session: Not on file  Stress: Stress Concern  Present (10/02/2023)   [employer] of Occupational Health - Occupational Stress Questionnaire    Feeling of Stress: Rather much  Social Connections: Socially Isolated (10/02/2023)   Social Connection and Isolation Panel    Frequency of Communication with Friends and Family: Once a week    Frequency of Social Gatherings with Friends and Family: Once a week    Attends Religious Services: Never    Database Administrator or Organizations: No    Attends Engineer, Structural: Not on file    Marital Status: Never married  Depression (PHQ2-9): High Risk (12/07/2023)   Depression (PHQ2-9)    PHQ-2 Score: 11  Alcohol Screen: Not on file  Housing: Low Risk (10/02/2023)   Epic    Unable to Pay for Housing in the Last Year: No    Number of Times Moved in the Last Year: 1    Homeless in the Last Year: No  Utilities: Not At Risk (09/05/2023)   Received from [facility] Util[facility]    Threatened with loss of utilities: No  Health Literacy: Not on file    Family History  Problem Relation  Age of Onset   Hyperlipidemia Mother    Diabetes Mother    Diabetes Mellitus II Mother    Hypertension Mother    Obesity Mother    Arthritis Mother    Obesity Father    Hypertension Father    CAD Father    Chronic Renal Failure Father    Hyperlipidemia Father    Heart disease Father    Kidney disease Father    Alcoholism Father    Schizophrenia Maternal Aunt    Psoriasis Maternal Aunt    Psoriasis Maternal Aunt    Diabetes Paternal Aunt     Outpatient Encounter Medications as of 04/03/2024  Medication Sig   busPIRone (BUSPAR) 5 MG tablet Take 5 mg by mouth 2 (two) times daily.   escitalopram  (LEXAPRO ) 20 MG tablet Take 1 tablet (20 mg total) by mouth daily.   pregabalin (LYRICA) 75 MG capsule Take 75 mg by mouth 2 (two) times daily as needed (pain).   Risankizumab-rzaa (SKYRIZI) 150 MG/ML SOSY Inject 150 mg into the skin every 3 (three) months.   Tapinarof (VTAMA) 1 % CREA Apply 1 Application topically daily as needed (psoriasis).   triamcinolone cream (KENALOG) 0.1 % Apply 1 application topically 2 (two) times daily as needed (psoriasis).    valsartan -hydrochlorothiazide  (DIOVAN -HCT) 160-12.5 MG tablet Take 1 tablet by mouth daily.   Vitamin D , Ergocalciferol , (DRISDOL ) 1.25 MG (50000 UNIT) CAPS capsule TAKE 1 CAPSULE (50,000 UNITS TOTAL) BY MOUTH once weekly   [DISCONTINUED] Continuous Glucose Sensor (DEXCOM G7 SENSOR) MISC Inject 1 Application into the skin as directed. Change sensor every 10 days as directed.   [DISCONTINUED] Dulaglutide  (TRULICITY ) 1.5 MG/0.5ML SOAJ Inject 1.5 mg into the skin once a week.   [DISCONTINUED] glipiZIDE  (GLUCOTROL  XL) 5 MG 24 hr tablet Take 1 tablet (5 mg total) by mouth daily with breakfast.   [DISCONTINUED] insulin  glargine (LANTUS  SOLOSTAR) 100 UNIT/ML Solostar Pen Inject 50 Units into the skin at bedtime.   [DISCONTINUED] insulin  lispro (HUMALOG  KWIKPEN) 100 UNIT/ML KwikPen Inject 10-16 Units into the skin 3 (three) times daily.    [DISCONTINUED] Insulin  Pen Needle (PEN NEEDLES) 31G X 8 MM MISC Use to inject insulin  4 times daily   [DISCONTINUED] metFORMIN  (GLUCOPHAGE ) 1000 MG tablet Take 1 tablet (1,000 mg total) by mouth 2 (two) times daily with a meal.  Continuous Glucose Sensor (DEXCOM G7 SENSOR) MISC Inject 1 Application into the skin as directed. Change sensor every 10 days as directed.   Dulaglutide  (TRULICITY ) 1.5 MG/0.5ML SOAJ Inject 1.5 mg into the skin once a week.   glipiZIDE  (GLUCOTROL  XL) 5 MG 24 hr tablet Take 1 tablet (5 mg total) by mouth daily with breakfast.   insulin  glargine (LANTUS  SOLOSTAR) 100 UNIT/ML Solostar Pen Inject 60 Units into the skin at bedtime.   insulin  lispro (HUMALOG  KWIKPEN) 100 UNIT/ML KwikPen Inject 15-21 Units into the skin 3 (three) times daily.   Insulin  Pen Needle (PEN NEEDLES) 31G X 8 MM MISC Use to inject insulin  4 times daily   metFORMIN  (GLUCOPHAGE ) 1000 MG tablet Take 1 tablet (1,000 mg total) by mouth 2 (two) times daily with a meal.   [DISCONTINUED] hydrocortisone -pramoxine (PROCTOFOAM-HC) rectal foam Place 1 applicator rectally 2 (two) times daily. (Patient not taking: Reported on 04/03/2024)   No facility-administered encounter medications on file as of 04/03/2024.    ALLERGIES: Allergies  Allergen Reactions   Dilaudid [Hydromorphone Hcl] Nausea And Vomiting   Hydromorphone     Other reaction(s): Unknown   Methotrexate Other (See Comments)    Childhood reaction, foaming at the mouth    VACCINATION STATUS: Immunization History  Administered Date(s) Administered   Dtap, Unspecified 02/18/1975, 05/21/1975, 10/18/1975, 10/17/1976   Fluzone Influenza virus vaccine,trivalent (IIV3), split virus 05/01/2020   Influenza, Seasonal, Injecte, Preservative Fre 02/28/2013, 12/29/2022   Influenza,inj,Quad PF,6+ Mos 12/24/2021   Influenza-Unspecified 01/12/2009   MMR 07/18/1976, 05/06/1992, 11/11/1992   PFIZER(Purple Top)SARS-COV-2 Vaccination 07/31/2019, 08/28/2019,  05/01/2020   Pfizer Covid-19 Vaccine Bivalent Booster 11yrs & up 06/11/2021   Polio, Unspecified 02/18/1975, 05/21/1975, 10/18/1975, 10/17/1976   Tdap 07/21/2009, 10/11/2020    Diabetes She presents for her follow-up diabetic visit. She has type 2 diabetes mellitus. Onset time: formally diagnosed at age 89. Her disease course has been worsening. There are no hypoglycemic associated symptoms. Associated symptoms include blurred vision, fatigue, polydipsia and polyuria. There are no hypoglycemic complications. Symptoms are stable. There are no diabetic complications. Risk factors for coronary artery disease include diabetes mellitus, dyslipidemia, obesity, hypertension and sedentary lifestyle. Current diabetic treatment includes oral agent (triple therapy) and intensive insulin  program (and Trulicity ). She is compliant with treatment most of the time (misses opportunities for her short-acting insulin ). Her weight is fluctuating minimally. She is following a generally unhealthy diet. When asked about meal planning, she reported none. She has not had a previous visit with a dietitian. She rarely participates in exercise. Her home blood glucose trend is increasing steadily. Her breakfast blood glucose range is generally >200 mg/dl. Her lunch blood glucose range is generally >200 mg/dl. Her dinner blood glucose range is generally >200 mg/dl. Her bedtime blood glucose range is generally >200 mg/dl. Her overall blood glucose range is >200 mg/dl. (She presents today with her CGM showing gross hyperglycemia overall.  Her POCT A1c today is 12.1%, increasing from last visit of 11.5%.  Analysis of her CGM shows TIR 0%, TAR 100% (100% in level 2 hyperglycemia range), TBR 0% with a GMI of 11.9%.  She is eating less fried foods recently.  She has been forgetting her meal time insulin  recently as well.) An ACE inhibitor/angiotensin II receptor blocker is being taken. She does not see a podiatrist.Eye exam is not current.      Review of systems  Constitutional: + Minimally fluctuating body weight, current Body mass index is 50.22 kg/m., no fatigue, no subjective hyperthermia, no subjective  hypothermia Eyes: no blurry vision, no xerophthalmia ENT: no sore throat, no nodules palpated in throat, no dysphagia/odynophagia, no hoarseness Cardiovascular: no chest pain, no shortness of breath, no palpitations, no leg swelling Respiratory: no cough, no shortness of breath Gastrointestinal: no nausea/vomiting/diarrhea Musculoskeletal: no muscle/joint aches Skin: + dry flaky skin to BLE, + hyperemia to BLE Neurological: no tremors, no numbness, no tingling, no dizziness Psychiatric: no depression, no anxiety  Objective:     BP 110/82 (BP Location: Left Arm, Patient Position: Sitting, Cuff Size: Large)   Pulse 76   Ht 5' 5 (1.651 m)   Wt (!) 301 lb 12.8 oz (136.9 kg)   BMI 50.22 kg/m   Wt Readings from Last 3 Encounters:  04/03/24 (!) 301 lb 12.8 oz (136.9 kg)  01/03/24 (!) 301 lb 9.6 oz (136.8 kg)  12/14/23 300 lb (136.1 kg)     BP Readings from Last 3 Encounters:  04/03/24 110/82  01/03/24 130/72  12/14/23 132/70     Physical Exam- Limited  Constitutional:  Body mass index is 50.22 kg/m. , not in acute distress, normal state of mind, + cushingoid appearance with dorsocervical fat pad present and moon face Eyes:  EOMI, no exophthalmos Musculoskeletal: no gross deformities, strength intact in all four extremities, no gross restriction of joint movements Skin:  + dry flaky skin to BLE, + hyperemia to BLE Neurological: no tremor with outstretched hands   Diabetic Foot Exam - Simple   No data filed     CMP ( most recent) CMP     Component Value Date/Time   NA 134 (L) 12/27/2023 1103   K 4.2 12/27/2023 1103   CL 98 12/27/2023 1103   CO2 23 12/27/2023 1103   GLUCOSE 242 (H) 12/27/2023 1103   BUN 15 12/27/2023 1103   CREATININE 0.82 12/27/2023 1103   CALCIUM 9.1 12/27/2023 1103   PROT  7.7 12/27/2023 1103   ALBUMIN 3.1 (L) 12/27/2023 1103   AST 37 12/27/2023 1103   ALT 45 (H) 12/27/2023 1103   ALKPHOS 95 12/27/2023 1103   BILITOT 0.7 12/27/2023 1103   GFRNONAA >60 12/27/2023 1103   GFRAA >60 04/06/2019 1242     Diabetic Labs (most recent): Lab Results  Component Value Date   HGBA1C 12.1 (A) 04/03/2024   HGBA1C 11.5 (A) 01/03/2024   HGBA1C 11.0 (A) 06/23/2023     Lipid Panel ( most recent) Lipid Panel     Component Value Date/Time   CHOL 105 12/24/2021 1052   TRIG 119.0 12/24/2021 1052   HDL 38.60 (L) 12/24/2021 1052   CHOLHDL 3 12/24/2021 1052   VLDL 23.8 12/24/2021 1052   LDLCALC 42 12/24/2021 1052      Lab Results  Component Value Date   TSH 2.470 12/27/2023   TSH 2.10 10/06/2023   TSH 3.631 04/28/2023   TSH 1.94 05/27/2022   TSH 2.11 02/26/2022   TSH 1.95 11/19/2021   FREET4 1.07 12/27/2023   FREET4 0.94 04/28/2023   FREET4 1.10 11/19/2021           Assessment & Plan:   1) Type 2 diabetes mellitus with hyperglycemia, without long-term current use of insulin   She presents today with her CGM showing gross hyperglycemia overall.  Her POCT A1c today is 12.1%, increasing from last visit of 11.5%.  Analysis of her CGM shows TIR 0%, TAR 100% (100% in level 2 hyperglycemia range), TBR 0% with a GMI of 11.9%.  She is eating less fried foods recently.  She has  been forgetting her meal time insulin  recently as well.  - April Gonzales has currently uncontrolled symptomatic type 2 DM since 48 years of age.   -Recent labs reviewed.  - I had a long discussion with her about the progressive nature of diabetes and the pathology behind its complications. -her diabetes is complicated by pancreatitis (on 2 separate occasions) and she remains at a high risk for more acute and chronic complications which include CAD, CVA, CKD, retinopathy, and neuropathy. These are all discussed in detail with her.  The following Lifestyle Medicine recommendations  according to American College of Lifestyle Medicine [facility]) were discussed and offered to patient and she agrees to start the journey:  A. Whole Foods, Plant-based plate comprising of fruits and vegetables, plant-based proteins, whole-grain carbohydrates was discussed in detail with the patient.   A list for source of those nutrients were also provided to the patient.  Patient will use only water or unsweetened tea for hydration. B.  The need to stay away from risky substances including alcohol, smoking; obtaining 7 to 9 hours of restorative sleep, at least 150 minutes of moderate intensity exercise weekly, the importance of healthy social connections,  and stress reduction techniques were discussed. C.  A full color page of  Calorie density of various food groups per pound showing examples of each food groups was provided to the patient.  - Nutritional counseling repeated/built upon at each appointment.  - The patient admits there is a room for improvement in their diet and drink choices. -  Suggestion is made for the patient to avoid simple carbohydrates from their diet including Cakes, Sweet Desserts / Pastries, Ice Cream, Soda (diet and regular), Sweet Tea, Candies, Chips, Cookies, Sweet Pastries, Store Bought Juices, Alcohol in Excess of 1-2 drinks a day, Artificial Sweeteners, Coffee Creamer, and Sugar-free Products. This will help patient to have stable blood glucose profile and potentially avoid unintended weight gain.   - I encouraged the patient to switch to unprocessed or minimally processed complex starch and increased protein intake (animal or plant source), fruits, and vegetables.   - Patient is advised to stick to a routine mealtimes to eat 3 meals a day and avoid unnecessary snacks (to snack only to correct hypoglycemia).  - she is following with Penny Crumpton, RDN, CDE for diabetes education.  - I have approached her with the following individualized plan to manage her diabetes and  patient agrees:   - she is advised to continue Metformin  1000 mg po twice daily with meals and Glipizide  5 mg XL po daily, therapeutically suitable for patient.  She can also continue Trulicity  1.5 mg SQ weekly (will not increase due to risk of pancreatitis).   She is advised to increase Lantus  to 60 units SQ nightly and increase her Novolog  to 15-21 units TID with meals if glucose is above 90 and she is eating (Specific instructions on how to titrate insulin  dosage based on glucose readings given to patient in writing).   --she is encouraged to continue monitoring glucose 4 times daily (using her CGM), before meals and before bed, to and to call the clinic if she has readings less than 70 or above 300 for 3 tests in a row.  - Adjustment parameters are given to her for hypo and hyperglycemia in writing.  - her Farxiga  was previously discontinued, risk outweighs benefit for this patient- has had yeast and UTI infections since starting this medication.  - she is not an ideal candidate for  incretin therapy given her history of pancreatitis on 2 separate occasions in the past.  However, she has been on Trulicity  1.5 mg SQ weekly and tolerating it well, therefore she can continue it for now.    - Specific targets for  A1c; LDL, HDL, and Triglycerides were discussed with the patient.  2) Blood Pressure /Hypertension:  her blood pressure is controlled to target.   she is advised to continue her current medications as prescribed by her PCP.  3) Lipids/Hyperlipidemia:    Review of her recent lipid panel from 12/24/21 showed controlled LDL at 42 . She is not currently on any lipid lowering medications.  Has annual physical coming up with PCP with fasting labs.  4)  Weight/Diet:  her Body mass index is 50.22 kg/m.  -  clearly complicating her diabetes care.   she is a candidate for weight loss. I discussed with her the fact that loss of 5 - 10% of her  current body weight will have the most impact on her  diabetes management.  Exercise, and detailed carbohydrates information provided  -  detailed on discharge instructions.  5) Chronic Care/Health Maintenance: -she is on ACEI/ARB and not on Statin medications and is encouraged to initiate and continue to follow up with Ophthalmology, Dentist, Podiatrist at least yearly or according to recommendations, and advised to stay away from smoking. I have recommended yearly flu vaccine and pneumonia vaccine at least every 5 years; moderate intensity exercise for up to 150 minutes weekly; and sleep for at least 7 hours a day.  6) Cushingoid appearance Will check am-cortisol and urine cortisol level to assess for possible cushings disease.  If labs return abnormal, she is aware that her care will be transferred to Dr. Lenis for further evaluation and treatment.  Cortisol level was normal, urine cortisol was also normal, ruling out Cushings.  7) Vitamin D  deficiency Her recent vitamin D  level was low at 19.2.  She is currently on Ergocalciferol  50000 units weekly.  Will recheck vitamin d  prior to next visit.  - she is advised to maintain close follow up with Lendia Boby CROME, NP-C for primary care needs, as well as her other providers for optimal and coordinated care.  8) Abnormal TSH -resolved -Her thyroid  antibodies were negative ruling out autoimmune thyroid  dysfunction but her TSH and Free T4 were a tad closer to the under-active side.  In reviewing history of thyroid  labs, she has a tendency to fluctuate.  Repeat thyroid  function tests were normal, she does not need any hormone replacement at this time.     I spent  33  minutes in the care of the patient today including review of labs from CMP, Lipids, Thyroid  Function, Hematology (current and previous including abstractions from other facilities); face-to-face time discussing  her blood glucose readings/logs, discussing hypoglycemia and hyperglycemia episodes and symptoms, medications doses, her options of  short and long term treatment based on the latest standards of care / guidelines;  discussion about incorporating lifestyle medicine;  and documenting the encounter. Risk reduction counseling performed per USPSTF guidelines to reduce obesity and cardiovascular risk factors.     Please refer to Patient Instructions for Blood Glucose Monitoring and Insulin /Medications Dosing Guide  in media tab for additional information. Please  also refer to  Patient Self Inventory in the Media  tab for reviewed elements of pertinent patient history.  Mliss DELENA Lehmann participated in the discussions, expressed understanding, and voiced agreement with the above plans.  All questions  were answered to her satisfaction. she is encouraged to contact clinic should she have any questions or concerns prior to her return visit.     Follow up plan: - Return in about 3 months (around 07/02/2024) for Diabetes F/U with A1c in office, Previsit labs, Bring meter and logs.   [place]enton [place], [facility] [address] [place], [place] [address] Phone: [phone] Fax: [phone]  04/03/2024, 8:49 AM

## 2024-04-17 ENCOUNTER — Ambulatory Visit: Admitting: Family Medicine

## 2024-04-17 ENCOUNTER — Encounter: Payer: Self-pay | Admitting: Family Medicine

## 2024-04-17 VITALS — BP 136/84 | HR 97 | Temp 97.8°F | Ht 65.0 in | Wt 298.0 lb

## 2024-04-17 DIAGNOSIS — Z794 Long term (current) use of insulin: Secondary | ICD-10-CM

## 2024-04-17 DIAGNOSIS — E538 Deficiency of other specified B group vitamins: Secondary | ICD-10-CM

## 2024-04-17 DIAGNOSIS — I152 Hypertension secondary to endocrine disorders: Secondary | ICD-10-CM | POA: Diagnosis not present

## 2024-04-17 DIAGNOSIS — G4733 Obstructive sleep apnea (adult) (pediatric): Secondary | ICD-10-CM

## 2024-04-17 DIAGNOSIS — E1165 Type 2 diabetes mellitus with hyperglycemia: Secondary | ICD-10-CM | POA: Diagnosis not present

## 2024-04-17 DIAGNOSIS — E559 Vitamin D deficiency, unspecified: Secondary | ICD-10-CM

## 2024-04-17 DIAGNOSIS — Z23 Encounter for immunization: Secondary | ICD-10-CM | POA: Diagnosis not present

## 2024-04-17 DIAGNOSIS — L408 Other psoriasis: Secondary | ICD-10-CM | POA: Insufficient documentation

## 2024-04-17 DIAGNOSIS — Z1159 Encounter for screening for other viral diseases: Secondary | ICD-10-CM

## 2024-04-17 DIAGNOSIS — Z6841 Body Mass Index (BMI) 40.0 and over, adult: Secondary | ICD-10-CM

## 2024-04-17 DIAGNOSIS — Z0001 Encounter for general adult medical examination with abnormal findings: Secondary | ICD-10-CM

## 2024-04-17 DIAGNOSIS — Z7984 Long term (current) use of oral hypoglycemic drugs: Secondary | ICD-10-CM

## 2024-04-17 DIAGNOSIS — E1159 Type 2 diabetes mellitus with other circulatory complications: Secondary | ICD-10-CM | POA: Diagnosis not present

## 2024-04-17 DIAGNOSIS — L400 Psoriasis vulgaris: Secondary | ICD-10-CM | POA: Insufficient documentation

## 2024-04-17 LAB — CBC WITH DIFFERENTIAL/PLATELET
Basophils Absolute: 0 K/uL (ref 0.0–0.1)
Basophils Relative: 0.4 % (ref 0.0–3.0)
Eosinophils Absolute: 0.2 K/uL (ref 0.0–0.7)
Eosinophils Relative: 1.8 % (ref 0.0–5.0)
HCT: 41.2 % (ref 36.0–46.0)
Hemoglobin: 13.3 g/dL (ref 12.0–15.0)
Lymphocytes Relative: 22.8 % (ref 12.0–46.0)
Lymphs Abs: 2.4 K/uL (ref 0.7–4.0)
MCHC: 32.3 g/dL (ref 30.0–36.0)
MCV: 80.8 fl (ref 78.0–100.0)
Monocytes Absolute: 0.6 K/uL (ref 0.1–1.0)
Monocytes Relative: 5.3 % (ref 3.0–12.0)
Neutro Abs: 7.5 K/uL (ref 1.4–7.7)
Neutrophils Relative %: 69.7 % (ref 43.0–77.0)
Platelets: 262 K/uL (ref 150.0–400.0)
RBC: 5.09 Mil/uL (ref 3.87–5.11)
RDW: 15.9 % — ABNORMAL HIGH (ref 11.5–15.5)
WBC: 10.7 K/uL — ABNORMAL HIGH (ref 4.0–10.5)

## 2024-04-17 LAB — COMPREHENSIVE METABOLIC PANEL WITH GFR
ALT: 33 U/L (ref 3–35)
AST: 30 U/L (ref 5–37)
Albumin: 3.6 g/dL (ref 3.5–5.2)
Alkaline Phosphatase: 96 U/L (ref 39–117)
BUN: 11 mg/dL (ref 6–23)
CO2: 29 meq/L (ref 19–32)
Calcium: 8.7 mg/dL (ref 8.4–10.5)
Chloride: 98 meq/L (ref 96–112)
Creatinine, Ser: 0.75 mg/dL (ref 0.40–1.20)
GFR: 93.52 mL/min
Glucose, Bld: 275 mg/dL — ABNORMAL HIGH (ref 70–99)
Potassium: 4.1 meq/L (ref 3.5–5.1)
Sodium: 134 meq/L — ABNORMAL LOW (ref 135–145)
Total Bilirubin: 0.6 mg/dL (ref 0.2–1.2)
Total Protein: 7.7 g/dL (ref 6.0–8.3)

## 2024-04-17 LAB — MICROALBUMIN / CREATININE URINE RATIO
Creatinine,U: 169.8 mg/dL
Microalb Creat Ratio: 7.7 mg/g (ref 0.0–30.0)
Microalb, Ur: 1.3 mg/dL (ref 0.7–1.9)

## 2024-04-17 LAB — LIPID PANEL
Cholesterol: 110 mg/dL (ref 28–200)
HDL: 39.9 mg/dL
LDL Cholesterol: 49 mg/dL (ref 10–99)
NonHDL: 70.46
Total CHOL/HDL Ratio: 3
Triglycerides: 109 mg/dL (ref 10.0–149.0)
VLDL: 21.8 mg/dL (ref 0.0–40.0)

## 2024-04-17 LAB — VITAMIN B12: Vitamin B-12: 219 pg/mL (ref 211–911)

## 2024-04-17 NOTE — Patient Instructions (Signed)
 [facility] to schedule your mamm[facility]. Last one was in October 2024.

## 2024-04-17 NOTE — Progress Notes (Signed)
 "  Complete physical exam  Patient: April Gonzales   DOB: February 05, 1976   48 y.o. Female  MRN: [id number]  Subjective:    Chief Complaint  Patient presents with   Annual Exam    fasting   She is here for a complete physical exam.   Discussed the use of AI scribe software for clinical note transcription with the patient, who gave verbal consent to proceed.  History of Present Illness April Gonzales is a 48 year old female who presents for her annual physical exam and preventive healthcare visit.  Preventive health maintenance - Up to date on colon cancer screening with negative Cologuard on April 21, 2023 - No history of colonoscopy - Normal Pap smear in August 2025 - Mammogram performed in October 2024 was normal  Diabetes mellitus - Improved glycemic control following recent medication dose increases by endocrinology - Completed 24-hour urine test in June 2025 - Overdue for diabetic eye exam  Vitamin d  deficiency - Takes vitamin D  supplementation for deficiency  Obstructive sleep apnea - Has not yet started CPAP therapy due to scheduling delays with provider  Ophthalmologic care - Difficulty scheduling eye exam; next available visit is October 2026  Dermatologic care - Dermatology visit planned for March 2026  Salivary gland infection - Recent swelling on the side of face diagnosed as salivary gland infection - Treated with amoxicillin - Swelling worsened with eating but has since improved  Genitourinary and gastrointestinal symptoms - No urinary frequency, urgency, or dysuria - No blood in stool or changes in bowel habits    Health Maintenance  Topic Date Due   Eye exam for diabetics  Never done   HIV Screening  Never done   Hepatitis C Screening  Never done   Pneumococcal Vaccine (1 of 2 - PCV) Never done   Hepatitis B Vaccine (1 of 3 - 19+ 3-dose series) Never done   COVID-19 Vaccine (8 - 2025-26 season) 05/03/2024*   Hemoglobin A1C  10/02/2024    Complete foot exam   01/02/2025   Breast Cancer Screening  02/02/2025   Yearly kidney function blood test for diabetes  04/17/2025   Yearly kidney health urinalysis for diabetes  04/17/2025   Cologuard (Stool DNA test)  04/20/2026   Pap with HPV screening  12/06/2028   DTaP/Tdap/Td vaccine (7 - Td or Tdap) 10/12/2030   Flu Shot  Completed   HPV Vaccine  Aged Out   Meningitis B Vaccine  Aged Out  *Topic was postponed. The date shown is not the original due date.    Wears seatbelt always, uses sunscreen, smoke detectors in home and functioning, does not text while driving, feels safe in home environment.  Depression screening:    04/17/2024   10:36 AM 12/07/2023   11:04 AM 04/07/2023    1:34 PM  Depression screen PHQ 2/9  Decreased Interest 0 1 0  Down, Depressed, Hopeless 0 1 0  PHQ - 2 Score 0 2 0  Altered sleeping 0 1   Tired, decreased energy 0 3   Change in appetite 0 1   Feeling bad or failure about yourself  0 0   Trouble concentrating 0 3   Moving slowly or fidgety/restless 0 1   Suicidal thoughts 0 0   PHQ-9 Score 0 11    Difficult doing work/chores  Extremely dIfficult      Data saved with a previous flowsheet row definition   Anxiety Screening:     No data to  display           Patient Care Team: Lendia Boby CROME, NP-C as PCP - General (Family Medicine) Ob/Gyn, Landy Stains   Show/hide medication list[1]  Review of Systems  Constitutional:  Negative for chills, fever, malaise/fatigue and weight loss.  HENT:  Negative for congestion, ear pain, sinus pain and sore throat.   Eyes:  Positive for blurred vision. Negative for double vision and pain.  Respiratory:  Negative for cough, shortness of breath and wheezing.   Cardiovascular:  Negative for chest pain, palpitations and leg swelling.  Gastrointestinal:  Negative for abdominal pain, constipation, diarrhea, nausea and vomiting.  Genitourinary:  Negative for dysuria, frequency and urgency.   Musculoskeletal:  Positive for joint pain. Negative for back pain and myalgias.  Skin:  Negative for rash.  Neurological:  Negative for dizziness, focal weakness and headaches.  Psychiatric/Behavioral:  Negative for depression. The patient is not nervous/anxious.        Objective:    BP 136/84   Pulse 97   Temp 97.8 F (36.6 C) (Temporal)   Ht 5' 5 (1.651 m)   Wt 298 lb (135.2 kg)   SpO2 96%   BMI 49.59 kg/m  BP Readings from Last 3 Encounters:  04/17/24 136/84  04/03/24 110/82  01/03/24 130/72   Wt Readings from Last 3 Encounters:  04/17/24 298 lb (135.2 kg)  04/03/24 (!) 301 lb 12.8 oz (136.9 kg)  01/03/24 (!) 301 lb 9.6 oz (136.8 kg)    Physical Exam Constitutional:      General: She is not in acute distress.    Appearance: She is obese. She is not ill-appearing.  HENT:     Right Ear: Tympanic membrane, ear canal and external ear normal.     Left Ear: Tympanic membrane, ear canal and external ear normal.     Nose: Nose normal.     Mouth/Throat:     Mouth: Mucous membranes are moist.     Pharynx: Oropharynx is clear.  Eyes:     Extraocular Movements: Extraocular movements intact.     Conjunctiva/sclera: Conjunctivae normal.     Pupils: Pupils are equal, round, and reactive to light.  Neck:     Thyroid : No thyroid  mass, thyromegaly or thyroid  tenderness.  Cardiovascular:     Rate and Rhythm: Normal rate and regular rhythm.     Pulses: Normal pulses.     Heart sounds: Normal heart sounds.  Pulmonary:     Effort: Pulmonary effort is normal.     Breath sounds: Normal breath sounds.  Abdominal:     General: Bowel sounds are normal.     Palpations: Abdomen is soft.     Tenderness: There is no abdominal tenderness. There is no right CVA tenderness, left CVA tenderness, guarding or rebound.  Musculoskeletal:        General: Normal range of motion.     Cervical back: Normal range of motion and neck supple. No tenderness.     Right lower leg: No edema.     Left  lower leg: No edema.  Lymphadenopathy:     Cervical: No cervical adenopathy.  Skin:    General: Skin is warm and dry.     Findings: No lesion or rash.  Neurological:     General: No focal deficit present.     Mental Status: She is alert and oriented to person, place, and time.     Cranial Nerves: No cranial nerve deficit.     Sensory: No  sensory deficit.     Motor: No weakness.     Gait: Gait abnormal.     Comments: Uses rolling walker  Psychiatric:        Mood and Affect: Mood normal.        Behavior: Behavior normal.        Thought Content: Thought content normal.      Results for orders placed or performed in visit on 04/17/24  Vitamin B12  Result Value Ref Range   Vitamin B-12 219 211 - 911 pg/mL  Microalbumin / creatinine urine ratio  Result Value Ref Range   Microalb, Ur 1.3 0.7 - 1.9 mg/dL   Creatinine,U 830.1 mg/dL   Microalb Creat Ratio 7.7 0.0 - 30.0 mg/g  Lipid panel  Result Value Ref Range   Cholesterol 110 28 - 200 mg/dL   Triglycerides 890.9 89.9 - 149.0 mg/dL   HDL 60.09 >60.99 mg/dL   VLDL 78.1 0.0 - 59.9 mg/dL   LDL Cholesterol 49 10 - 99 mg/dL   Total CHOL/HDL Ratio 3    NonHDL 70.46   Comprehensive metabolic panel with GFR  Result Value Ref Range   Sodium 134 (L) 135 - 145 mEq/L   Potassium 4.1 3.5 - 5.1 mEq/L   Chloride 98 96 - 112 mEq/L   CO2 29 19 - 32 mEq/L   Glucose, Bld 275 (H) 70 - 99 mg/dL   BUN 11 6 - 23 mg/dL   Creatinine, Ser 9.24 0.40 - 1.20 mg/dL   Total Bilirubin 0.6 0.2 - 1.2 mg/dL   Alkaline Phosphatase 96 39 - 117 U/L   AST 30 5 - 37 U/L   ALT 33 3 - 35 U/L   Total Protein 7.7 6.0 - 8.3 g/dL   Albumin 3.6 3.5 - 5.2 g/dL   GFR 06.47 >39.99 mL/min   Calcium 8.7 8.4 - 10.5 mg/dL  CBC with Differential/Platelet  Result Value Ref Range   WBC 10.7 (H) 4.0 - 10.5 K/uL   RBC 5.09 3.87 - 5.11 Mil/uL   Hemoglobin 13.3 12.0 - 15.0 g/dL   HCT 58.7 63.9 - 53.9 %   MCV 80.8 78.0 - 100.0 fl   MCHC 32.3 30.0 - 36.0 g/dL   RDW 84.0  (H) 88.4 - 15.5 %   Platelets 262.0 150.0 - 400.0 K/uL   Neutrophils Relative % 69.7 43.0 - 77.0 %   Lymphocytes Relative 22.8 12.0 - 46.0 %   Monocytes Relative 5.3 3.0 - 12.0 %   Eosinophils Relative 1.8 0.0 - 5.0 %   Basophils Relative 0.4 0.0 - 3.0 %   Neutro Abs 7.5 1.4 - 7.7 K/uL   Lymphs Abs 2.4 0.7 - 4.0 K/uL   Monocytes Absolute 0.6 0.1 - 1.0 K/uL   Eosinophils Absolute 0.2 0.0 - 0.7 K/uL   Basophils Absolute 0.0 0.0 - 0.1 K/uL      Assessment & Plan:    Routine Health Maintenance and Physical Exam Problem List Items Addressed This Visit     B12 deficiency   Relevant Orders   Vitamin B12 (Completed)   Hypertension associated with diabetes (HCC)   Relevant Orders   CBC with Differential/Platelet (Completed)   Comprehensive metabolic panel with GFR (Completed)   Morbid obesity (HCC)   Relevant Orders   CBC with Differential/Platelet (Completed)   Comprehensive metabolic panel with GFR (Completed)   Lipid panel (Completed)   OSA (obstructive sleep apnea)   Uncontrolled type 2 diabetes mellitus with hyperglycemia (HCC)   Relevant Orders  CBC with Differential/Platelet (Completed)   Comprehensive metabolic panel with GFR (Completed)   Microalbumin / creatinine urine ratio (Completed)   Vitamin D  deficiency   Other Visit Diagnoses       Encounter for general adult medical examination with abnormal findings    -  Primary     Need for influenza vaccination       Relevant Orders   Flu vaccine trivalent PF, 6mos and older(Flulaval,Afluria,Fluarix,Fluzone) (Completed)     Encounter for screening for other viral diseases       Relevant Orders   Hepatitis C antibody   HIV Antibody (routine testing w rflx)       Assessment and Plan Assessment & Plan [facility] Visit Annual physical exam and preventive healthcare visit. Cologuard test negative in January 2025, next due in 2028. Pap smear normal in August 2025. Mammogram last done in October 2024, due for  annual screening. Flu shot received. Pneumonia vaccine deferred. HIV and hepatitis C screening overdue. Cholesterol, kidney, and liver function tests planned. Urine protein test pending. - Schedule mammogram - Ordered HIV and hepatitis C screening - Ordered cholesterol, kidney, and liver function tests - Ordered urine protein test - Deferred pneumonia vaccine  Type 2 diabetes mellitus with hyperglycemia Diabetes better controlled with recent medication adjustments by endocrinologist.  - Continue current diabetes management plan   Obstructive sleep apnea No current use of CPAP machine. Difficulty accessing sleep specialist due to long wait times. - Consider alternative sleep specialist for CPAP evaluation  Morbid obesity - work on healthy diet and increasing physical activity as tolerated   Vitamin D  deficiency Managed with high-dose ergocalciferol .  - Request refill if needed from endocrinologist follow-up her vitamin D  level labs as ordered by endocrinology.  Vitamin B12 deficiency - Check vitamin D  level and replace as appropriate  Hypertension -Continue current medication regimen and low-sodium diet -Monitor blood pressure at home and follow-up if not in goal range - Check renal function and electrolytes     No follow-ups on file.     Boby Mackintosh, NP-C      [1]  Outpatient Medications Prior to Visit  Medication Sig   busPIRone (BUSPAR) 5 MG tablet Take 5 mg by mouth 2 (two) times daily.   Continuous Glucose Sensor (DEXCOM G7 SENSOR) MISC Inject 1 Application into the skin as directed. Change sensor every 10 days as directed.   Dulaglutide  (TRULICITY ) 1.5 MG/0.5ML SOAJ Inject 1.5 mg into the skin once a week.   escitalopram  (LEXAPRO ) 20 MG tablet Take 1 tablet (20 mg total) by mouth daily.   glipiZIDE  (GLUCOTROL  XL) 5 MG 24 hr tablet Take 1 tablet (5 mg total) by mouth daily with breakfast.   insulin  glargine (LANTUS  SOLOSTAR) 100 UNIT/ML Solostar Pen Inject 60  Units into the skin at bedtime.   insulin  lispro (HUMALOG  KWIKPEN) 100 UNIT/ML KwikPen Inject 15-21 Units into the skin 3 (three) times daily.   Insulin  Pen Needle (PEN NEEDLES) 31G X 8 MM MISC Use to inject insulin  4 times daily   metFORMIN  (GLUCOPHAGE ) 1000 MG tablet Take 1 tablet (1,000 mg total) by mouth 2 (two) times daily with a meal.   pregabalin (LYRICA) 75 MG capsule Take 75 mg by mouth 2 (two) times daily as needed (pain).   Risankizumab-rzaa (SKYRIZI) 150 MG/ML SOSY Inject 150 mg into the skin every 3 (three) months.   Tapinarof (VTAMA) 1 % CREA Apply 1 Application topically daily as needed (psoriasis).   triamcinolone cream (KENALOG) 0.1 %  Apply 1 application topically 2 (two) times daily as needed (psoriasis).    valsartan -hydrochlorothiazide  (DIOVAN -HCT) 160-12.5 MG tablet Take 1 tablet by mouth daily.   Vitamin D , Ergocalciferol , (DRISDOL ) 1.25 MG (50000 UNIT) CAPS capsule TAKE 1 CAPSULE (50,000 UNITS TOTAL) BY MOUTH once weekly   No facility-administered medications prior to visit.   "

## 2024-04-18 ENCOUNTER — Ambulatory Visit: Payer: Self-pay | Admitting: Family Medicine

## 2024-04-18 LAB — HIV ANTIBODY (ROUTINE TESTING W REFLEX)
HIV 1&2 Ab, 4th Generation: NONREACTIVE
HIV FINAL INTERPRETATION: NEGATIVE

## 2024-04-18 LAB — HEPATITIS C ANTIBODY: Hepatitis C Ab: NONREACTIVE

## 2024-07-03 ENCOUNTER — Ambulatory Visit: Admitting: Podiatry

## 2024-07-13 ENCOUNTER — Ambulatory Visit: Admitting: Nurse Practitioner
